# Patient Record
Sex: Male | Born: 1980
Health system: Southern US, Community
[De-identification: ages and names within clinical notes are randomized; demographics above are authoritative.]

## PROBLEM LIST (undated history)

## (undated) DIAGNOSIS — I10 Essential (primary) hypertension: Secondary | ICD-10-CM

## (undated) DIAGNOSIS — F17210 Nicotine dependence, cigarettes, uncomplicated: Secondary | ICD-10-CM

## (undated) DIAGNOSIS — E663 Overweight: Secondary | ICD-10-CM

## (undated) DIAGNOSIS — R079 Chest pain, unspecified: Secondary | ICD-10-CM

## (undated) DIAGNOSIS — R9439 Abnormal result of other cardiovascular function study: Secondary | ICD-10-CM

## (undated) HISTORY — PX: NO PAST SURGERIES: SHX2092

## (undated) HISTORY — DX: Chest pain, unspecified: R07.9

## (undated) HISTORY — DX: Nicotine dependence, cigarettes, uncomplicated: F17.210

## (undated) HISTORY — DX: Essential (primary) hypertension: I10

## (undated) HISTORY — DX: Overweight: E66.3

## (undated) HISTORY — DX: Abnormal result of other cardiovascular function study: R94.39

---

## 2000-08-20 ENCOUNTER — Emergency Department (HOSPITAL_COMMUNITY): Admission: EM | Admit: 2000-08-20 | Discharge: 2000-08-20 | Payer: Self-pay | Admitting: Emergency Medicine

## 2000-08-22 ENCOUNTER — Emergency Department (HOSPITAL_COMMUNITY): Admission: EM | Admit: 2000-08-22 | Discharge: 2000-08-22 | Payer: Self-pay | Admitting: Emergency Medicine

## 2002-04-19 ENCOUNTER — Emergency Department (HOSPITAL_COMMUNITY): Admission: EM | Admit: 2002-04-19 | Discharge: 2002-04-19 | Payer: Self-pay | Admitting: Emergency Medicine

## 2002-04-19 ENCOUNTER — Encounter: Payer: Self-pay | Admitting: Emergency Medicine

## 2002-04-26 ENCOUNTER — Emergency Department (HOSPITAL_COMMUNITY): Admission: EM | Admit: 2002-04-26 | Discharge: 2002-04-26 | Payer: Self-pay | Admitting: Emergency Medicine

## 2002-12-15 ENCOUNTER — Emergency Department (HOSPITAL_COMMUNITY): Admission: EM | Admit: 2002-12-15 | Discharge: 2002-12-15 | Payer: Self-pay | Admitting: Emergency Medicine

## 2006-11-05 ENCOUNTER — Emergency Department (HOSPITAL_COMMUNITY): Admission: EM | Admit: 2006-11-05 | Discharge: 2006-11-06 | Payer: Self-pay | Admitting: Emergency Medicine

## 2008-08-25 ENCOUNTER — Emergency Department (HOSPITAL_COMMUNITY): Admission: EM | Admit: 2008-08-25 | Discharge: 2008-08-25 | Payer: Self-pay | Admitting: Emergency Medicine

## 2011-12-08 ENCOUNTER — Emergency Department (HOSPITAL_COMMUNITY)
Admission: EM | Admit: 2011-12-08 | Discharge: 2011-12-08 | Disposition: A | Discharge status: Home / Self Care | Payer: No Typology Code available for payment source | Attending: Emergency Medicine | Admitting: Emergency Medicine

## 2011-12-08 ENCOUNTER — Encounter (HOSPITAL_COMMUNITY): Payer: Self-pay | Admitting: *Deleted

## 2011-12-08 DIAGNOSIS — Y9389 Activity, other specified: Secondary | ICD-10-CM | POA: Insufficient documentation

## 2011-12-08 DIAGNOSIS — S0993XA Unspecified injury of face, initial encounter: Secondary | ICD-10-CM | POA: Insufficient documentation

## 2011-12-08 DIAGNOSIS — IMO0002 Reserved for concepts with insufficient information to code with codable children: Secondary | ICD-10-CM | POA: Insufficient documentation

## 2011-12-08 DIAGNOSIS — M549 Dorsalgia, unspecified: Secondary | ICD-10-CM

## 2011-12-08 DIAGNOSIS — M542 Cervicalgia: Secondary | ICD-10-CM

## 2011-12-08 DIAGNOSIS — F17200 Nicotine dependence, unspecified, uncomplicated: Secondary | ICD-10-CM | POA: Insufficient documentation
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
## 2011-12-08 NOTE — ED Notes (Signed)
The pts blood was drawn.  Not needed lab notified.  Will not be resulted

## 2016-01-25 ENCOUNTER — Encounter (HOSPITAL_COMMUNITY): Payer: Self-pay

## 2016-01-25 ENCOUNTER — Emergency Department (HOSPITAL_COMMUNITY)
Admission: EM | Admit: 2016-01-25 | Discharge: 2016-01-25 | Disposition: A | Discharge status: Home / Self Care | Payer: Self-pay | Attending: Emergency Medicine | Admitting: Emergency Medicine

## 2016-01-25 ENCOUNTER — Emergency Department (HOSPITAL_COMMUNITY): Payer: Self-pay

## 2016-01-25 DIAGNOSIS — F17210 Nicotine dependence, cigarettes, uncomplicated: Secondary | ICD-10-CM | POA: Insufficient documentation

## 2016-01-25 DIAGNOSIS — J209 Acute bronchitis, unspecified: Secondary | ICD-10-CM

## 2016-01-25 IMAGING — CR DG CHEST 2V
2 series · 2 of 2 positions shown · non-contrast
Comparison: None in PACs

CLINICAL DATA: Illness of breath, cough, chest congestion, and body
aches for the past week. Her daughter was recently diagnosed with
pneumonia.

EXAM:
CHEST  2 VIEW

[w chest pa]
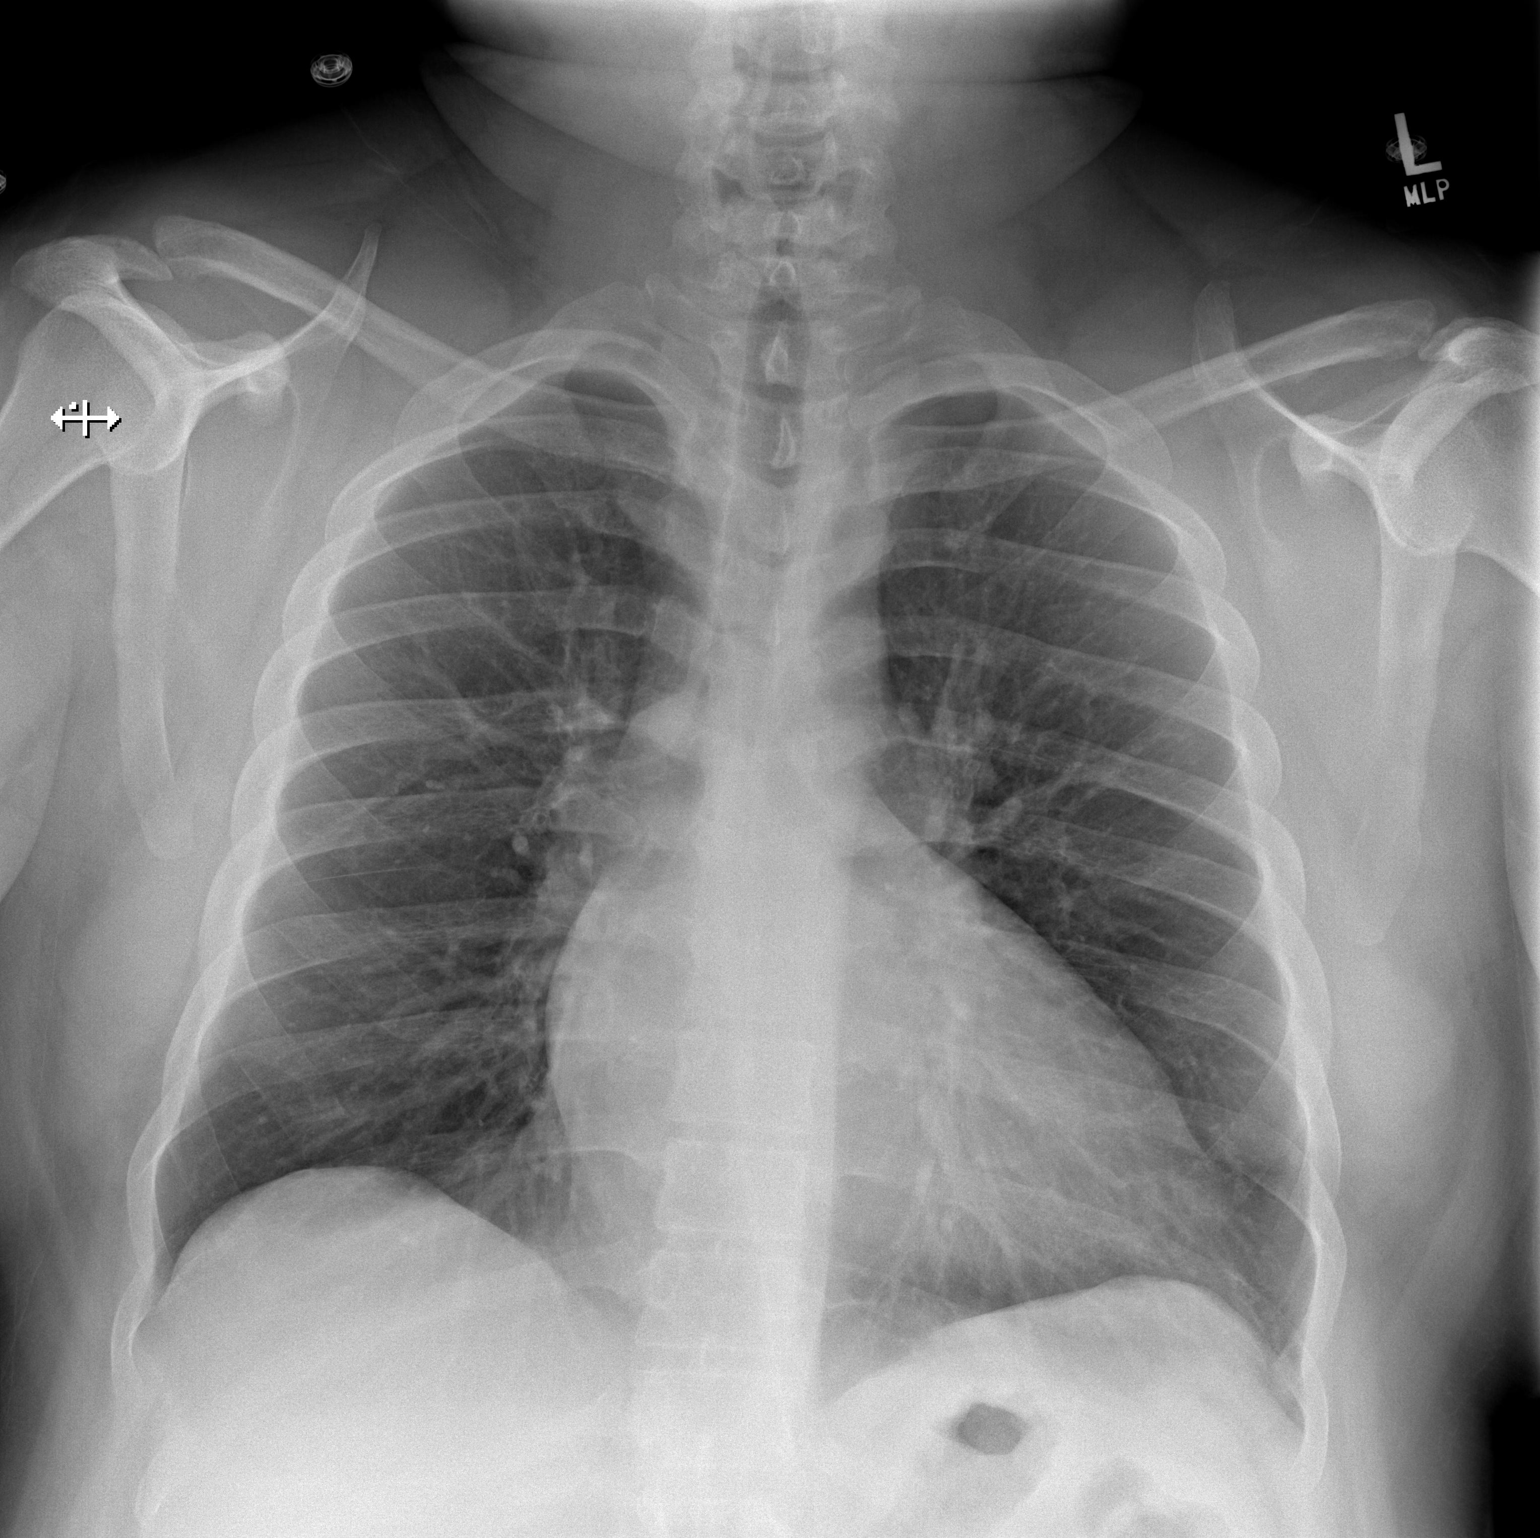

[w chest lat]
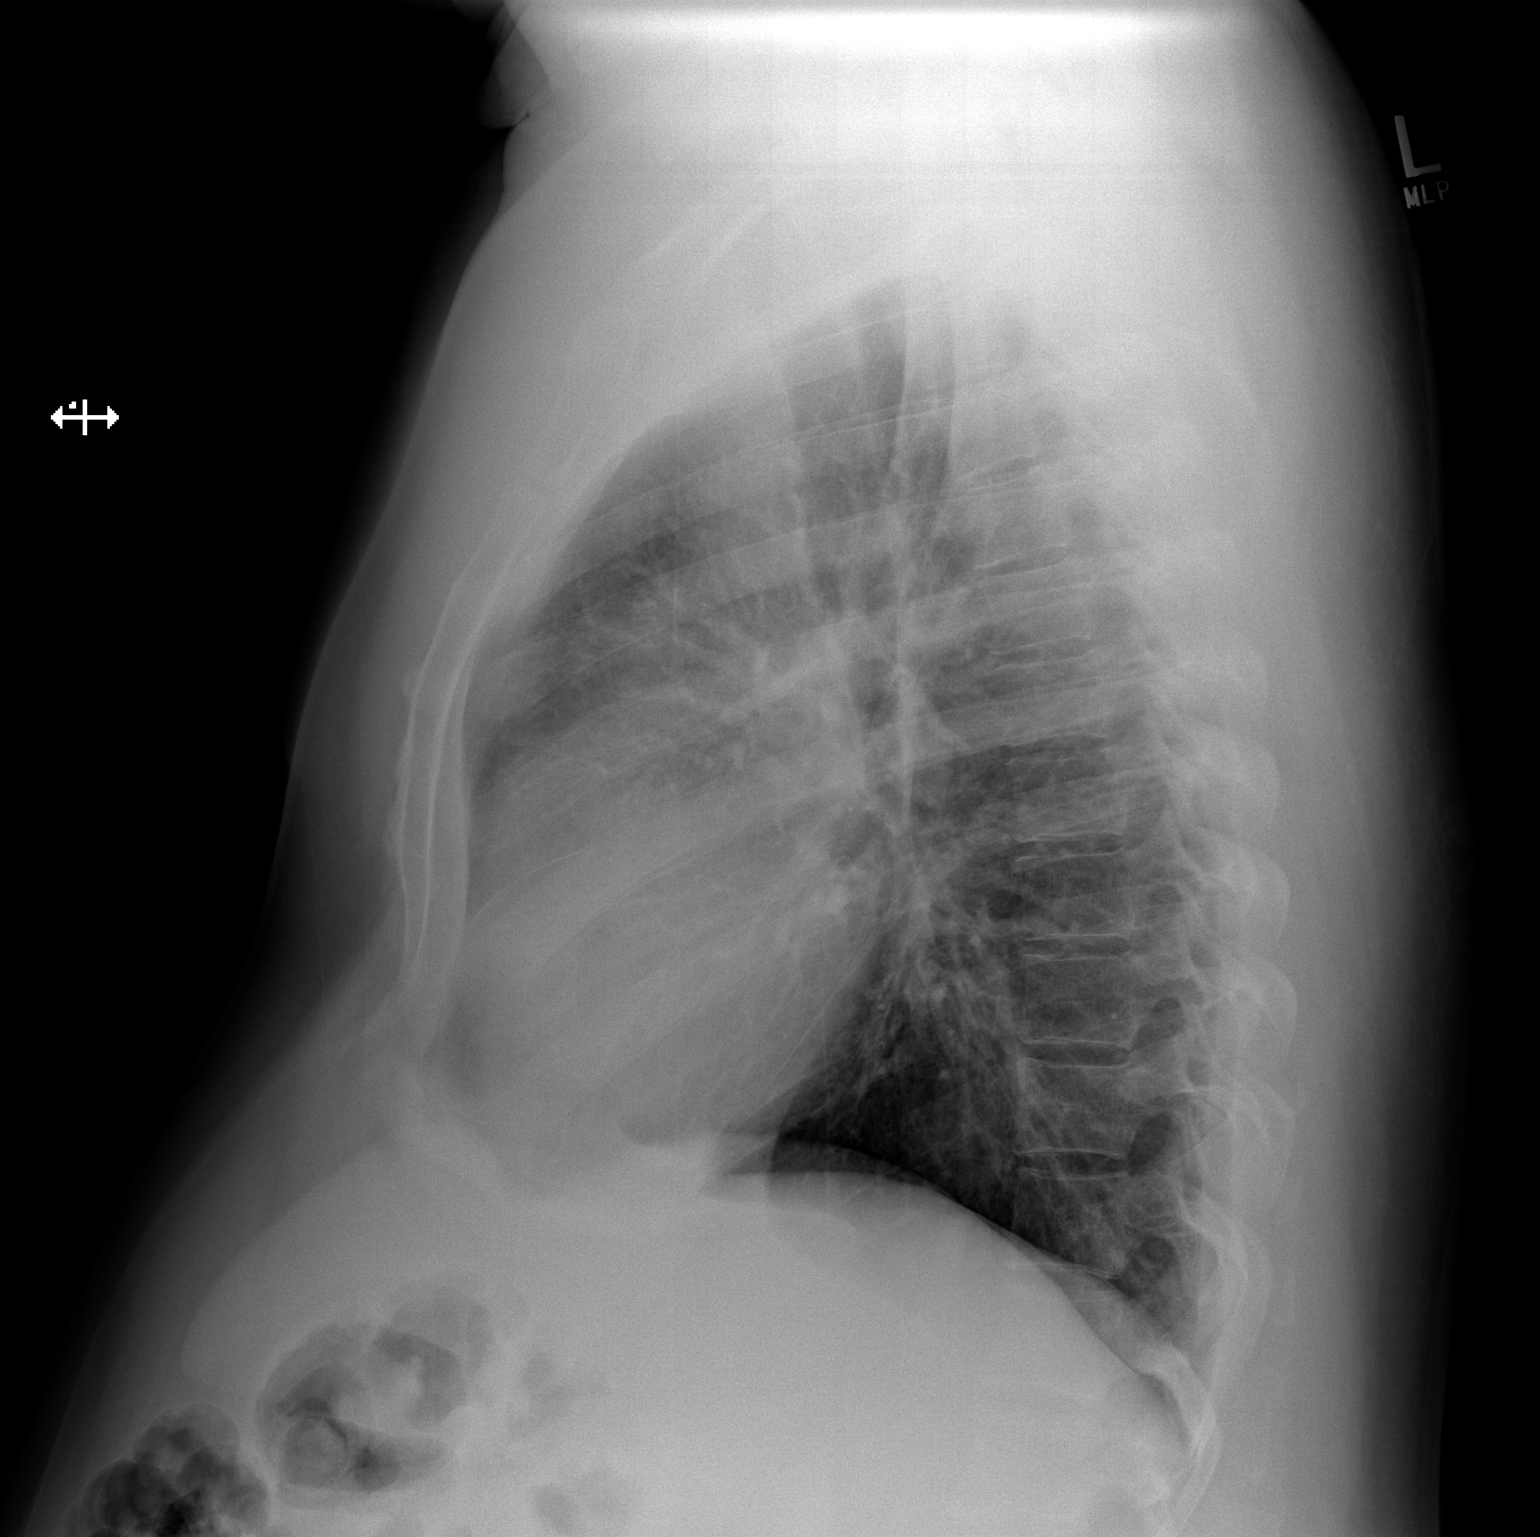

[2 of 2 positions shown; findings below may reference images not displayed]

FINDINGS: The lungs are adequately inflated and clear. The heart is top-normal
in size. The pulmonary vascularity is normal. The mediastinum is
normal in width. There is no pleural effusion. The bony thorax
exhibits no acute abnormality.
IMPRESSION: There is no active cardiopulmonary disease.

## 2016-01-25 MED ORDER — AZITHROMYCIN 250 MG PO TABS: ORAL_TABLET | ORAL | 0 refills | Status: DC

## 2016-01-25 NOTE — Discharge Instructions (Signed)
Zithromax as prescribed.  Continue over-the-counter medications as needed for symptomatically relief.  Return to the ER if symptoms significantly worsen or change.

## 2016-01-25 NOTE — ED Notes (Signed)
Patient transported to X-ray 

## 2016-01-25 NOTE — ED Triage Notes (Signed)
Patient c/o URI x 1 week tht includes, productive cough, nasal congestion, sore throat, chills, and body aches. Patient's daughter has had pneumonia recently.

## 2016-01-25 NOTE — ED Provider Notes (Signed)
[facility]-EMERGENCY DEPT Provider Note   CSN: [id number] Arrival date & time: 01/25/16  0913     History   Chief Complaint Chief Complaint  Patient presents with  . Shortness of Breath  . Cough    HPI Luke Wade is a 35 y.o. male.  Patient is a [age] year old male with no significant past medical history. He presents for evaluation of chest congestion and productive cough which is worsened over the past week. He feels short of breath at times but denies any chest pain. He has not checked his temperature but has felt fever. He does smoke cigarettes.   The history is provided by the patient.  Shortness of Breath  This is a new problem. The average episode lasts 1 week. The problem occurs continuously.The problem has been gradually worsening. Associated symptoms include cough and sputum production. Pertinent negatives include no fever and no chest pain. He has tried nothing for the symptoms. He has had no prior hospitalizations.  Cough  Associated symptoms include shortness of breath. Pertinent negatives include no chest pain.    History reviewed. No pertinent past medical history.  There are no active problems to display for this patient.   History reviewed. No pertinent surgical history.     Home Medications    Prior to Admission medications   Not on File    Family History Family History  Problem Relation Age of Onset  . Cancer Mother   . Cancer Father     Social History Social History  Substance Use Topics  . Smoking status: Current Every Day Smoker    Packs/day: 0.25    Types: Cigarettes  . Smokeless tobacco: Never Used  . Alcohol use Yes     Comment: weekends      Allergies   Patient has no known allergies.   Review of Systems Review of Systems  Constitutional: Negative for fever.  Respiratory: Positive for cough, sputum production and shortness of breath.   Cardiovascular: Negative for chest pain.  All other systems reviewed and are  negative.    Physical Exam Updated Vital Signs BP 148/85 (BP Location: Left Arm)   Pulse 104   Temp 98.4 F (36.9 C) (Oral)   Resp 18   Ht 5\' 5"  (1.651 m)   Wt 236 lb (107 kg)   SpO2 94%   BMI 39.27 kg/m   Physical Exam  Constitutional: He is oriented to person, place, and time. He appears well-developed and well-nourished. No distress.  HENT:  Head: Normocephalic and atraumatic.  Mouth/Throat: Oropharynx is clear and moist.  Neck: Normal range of motion. Neck supple.  Cardiovascular: Normal rate and regular rhythm.  Exam reveals no friction rub.   No murmur heard. Pulmonary/Chest: Effort normal and breath sounds normal. No respiratory distress. He has no wheezes. He has no rales.  Abdominal: Soft. Bowel sounds are normal. He exhibits no distension. There is no tenderness.  Musculoskeletal: Normal range of motion. He exhibits no edema.  Neurological: He is alert and oriented to person, place, and time. Coordination normal.  Skin: Skin is warm and dry. He is not diaphoretic.  Nursing note and vitals reviewed.    ED Treatments / Results  Labs (all labs ordered are listed, but only abnormal results are displayed) Labs Reviewed - No data to display  EKG  EKG Interpretation None       Radiology No results found.  Procedures Procedures (including critical care time)  Medications Ordered in ED Medications - No data to  display   Initial Impression / Assessment and Plan / ED Course  I have reviewed the triage vital signs and the nursing notes.  Pertinent labs & imaging results that were available during my care of the patient were reviewed by me and considered in my medical decision making (see chart for details).  Clinical Course     Chest x-ray is clear. Patient has ongoing symptoms for one week and is a smoker, and his daughter at home was recently diagnosed with pneumonia. He will be treated with Zithromax and when necessary return.  Final Clinical  Impressions(s) / ED Diagnoses   Final diagnoses:  None    New Prescriptions New Prescriptions   No medications on file     Geoffery Lyonsouglas Juletta Berhe, MD 01/25/16 1103

## 2017-04-11 ENCOUNTER — Other Ambulatory Visit: Payer: Self-pay

## 2017-04-11 ENCOUNTER — Encounter (HOSPITAL_COMMUNITY): Payer: Self-pay

## 2017-04-11 ENCOUNTER — Emergency Department (HOSPITAL_COMMUNITY): Payer: 59

## 2017-04-11 ENCOUNTER — Emergency Department (HOSPITAL_COMMUNITY)
Admission: EM | Admit: 2017-04-11 | Discharge: 2017-04-11 | Disposition: A | Discharge status: Home / Self Care | Payer: 59 | Attending: Emergency Medicine | Admitting: Emergency Medicine

## 2017-04-11 DIAGNOSIS — Z79899 Other long term (current) drug therapy: Secondary | ICD-10-CM | POA: Insufficient documentation

## 2017-04-11 DIAGNOSIS — R9431 Abnormal electrocardiogram [ECG] [EKG]: Secondary | ICD-10-CM | POA: Diagnosis not present

## 2017-04-11 DIAGNOSIS — R079 Chest pain, unspecified: Secondary | ICD-10-CM | POA: Insufficient documentation

## 2017-04-11 DIAGNOSIS — R030 Elevated blood-pressure reading, without diagnosis of hypertension: Secondary | ICD-10-CM | POA: Diagnosis not present

## 2017-04-11 DIAGNOSIS — F17210 Nicotine dependence, cigarettes, uncomplicated: Secondary | ICD-10-CM | POA: Diagnosis not present

## 2017-04-11 DIAGNOSIS — Z716 Tobacco abuse counseling: Secondary | ICD-10-CM | POA: Diagnosis not present

## 2017-04-11 DIAGNOSIS — M546 Pain in thoracic spine: Secondary | ICD-10-CM | POA: Diagnosis not present

## 2017-04-11 DIAGNOSIS — I10 Essential (primary) hypertension: Secondary | ICD-10-CM | POA: Diagnosis not present

## 2017-04-11 LAB — COMPREHENSIVE METABOLIC PANEL
ALK PHOS: 58 U/L (ref 38–126)
ALT: 20 U/L (ref 17–63)
AST: 18 U/L (ref 15–41)
Albumin: 3.7 g/dL (ref 3.5–5.0)
Anion gap: 10 (ref 5–15)
BUN: 14 mg/dL (ref 6–20)
CALCIUM: 8.5 mg/dL — AB (ref 8.9–10.3)
CHLORIDE: 107 mmol/L (ref 101–111)
CO2: 21 mmol/L — ABNORMAL LOW (ref 22–32)
CREATININE: 0.92 mg/dL (ref 0.61–1.24)
Glucose, Bld: 96 mg/dL (ref 65–99)
Potassium: 4.2 mmol/L (ref 3.5–5.1)
Sodium: 138 mmol/L (ref 135–145)
Total Bilirubin: 0.4 mg/dL (ref 0.3–1.2)
Total Protein: 6.5 g/dL (ref 6.5–8.1)

## 2017-04-11 LAB — I-STAT TROPONIN, ED
TROPONIN I, POC: 0 ng/mL (ref 0.00–0.08)
TROPONIN I, POC: 0 ng/mL (ref 0.00–0.08)

## 2017-04-11 LAB — CBC WITH DIFFERENTIAL/PLATELET
Basophils Absolute: 0 10*3/uL (ref 0.0–0.1)
Basophils Relative: 0 %
EOS ABS: 0.1 10*3/uL (ref 0.0–0.7)
Eosinophils Relative: 2 %
HCT: 43.5 % (ref 39.0–52.0)
Hemoglobin: 14.1 g/dL (ref 13.0–17.0)
LYMPHS ABS: 1.6 10*3/uL (ref 0.7–4.0)
Lymphocytes Relative: 20 %
MCH: 25.8 pg — AB (ref 26.0–34.0)
MCHC: 32.4 g/dL (ref 30.0–36.0)
MCV: 79.7 fL (ref 78.0–100.0)
Monocytes Absolute: 0.4 10*3/uL (ref 0.1–1.0)
Monocytes Relative: 5 %
Neutro Abs: 5.6 10*3/uL (ref 1.7–7.7)
Neutrophils Relative %: 73 %
PLATELETS: 249 10*3/uL (ref 150–400)
RBC: 5.46 MIL/uL (ref 4.22–5.81)
RDW: 15.5 % (ref 11.5–15.5)
WBC: 7.7 10*3/uL (ref 4.0–10.5)

## 2017-04-11 LAB — D-DIMER, QUANTITATIVE (NOT AT ARMC): D DIMER QUANT: 0.31 ug{FEU}/mL (ref 0.00–0.50)

## 2017-04-11 IMAGING — DX DG CHEST 2V
2 series · 2 of 2 positions shown · non-contrast
Comparison: PA and lateral chest [DATE].

CLINICAL DATA: Hypertension.  Abnormal EKG today.

EXAM:
CHEST - 2 VIEW

[w chest pa]
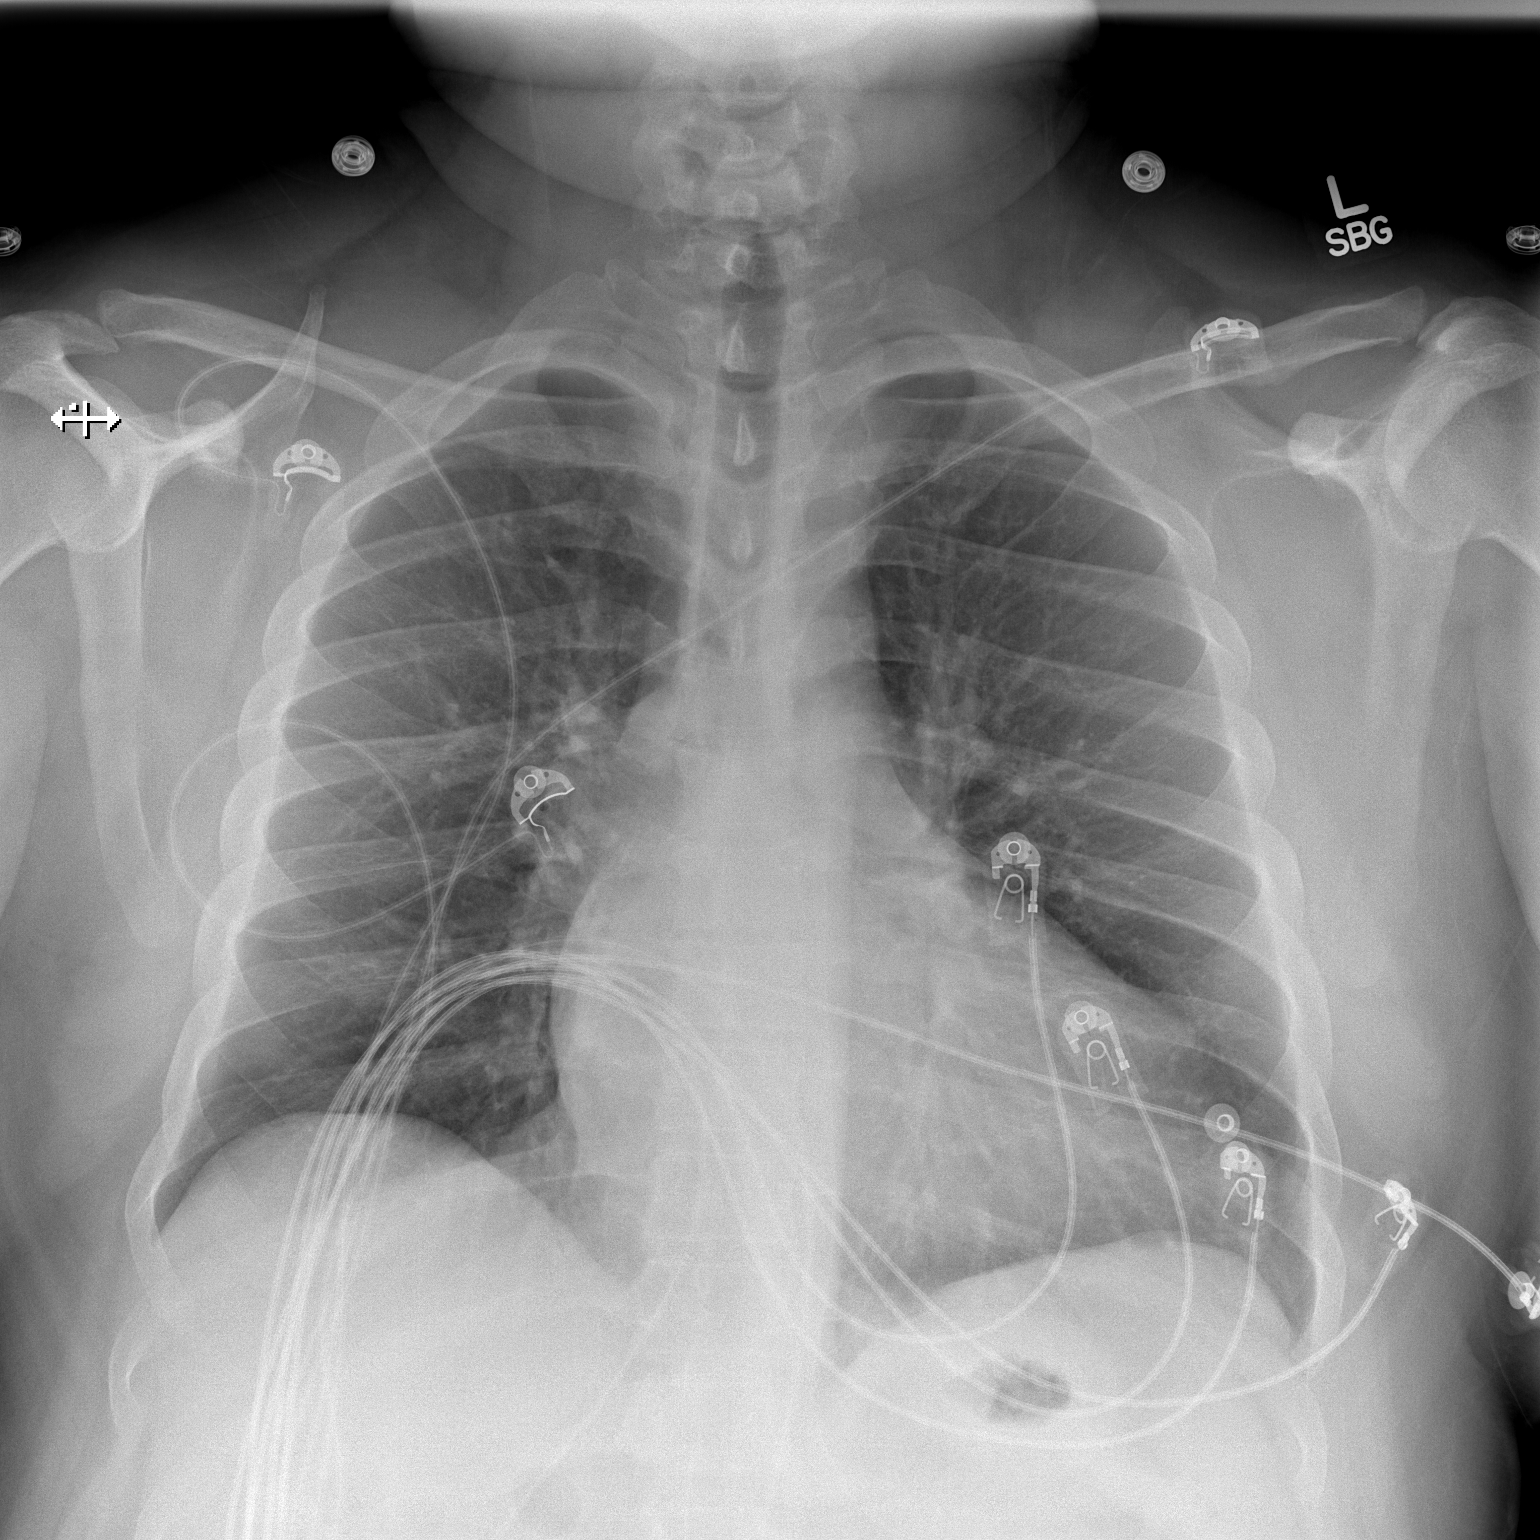

[w chest lat]
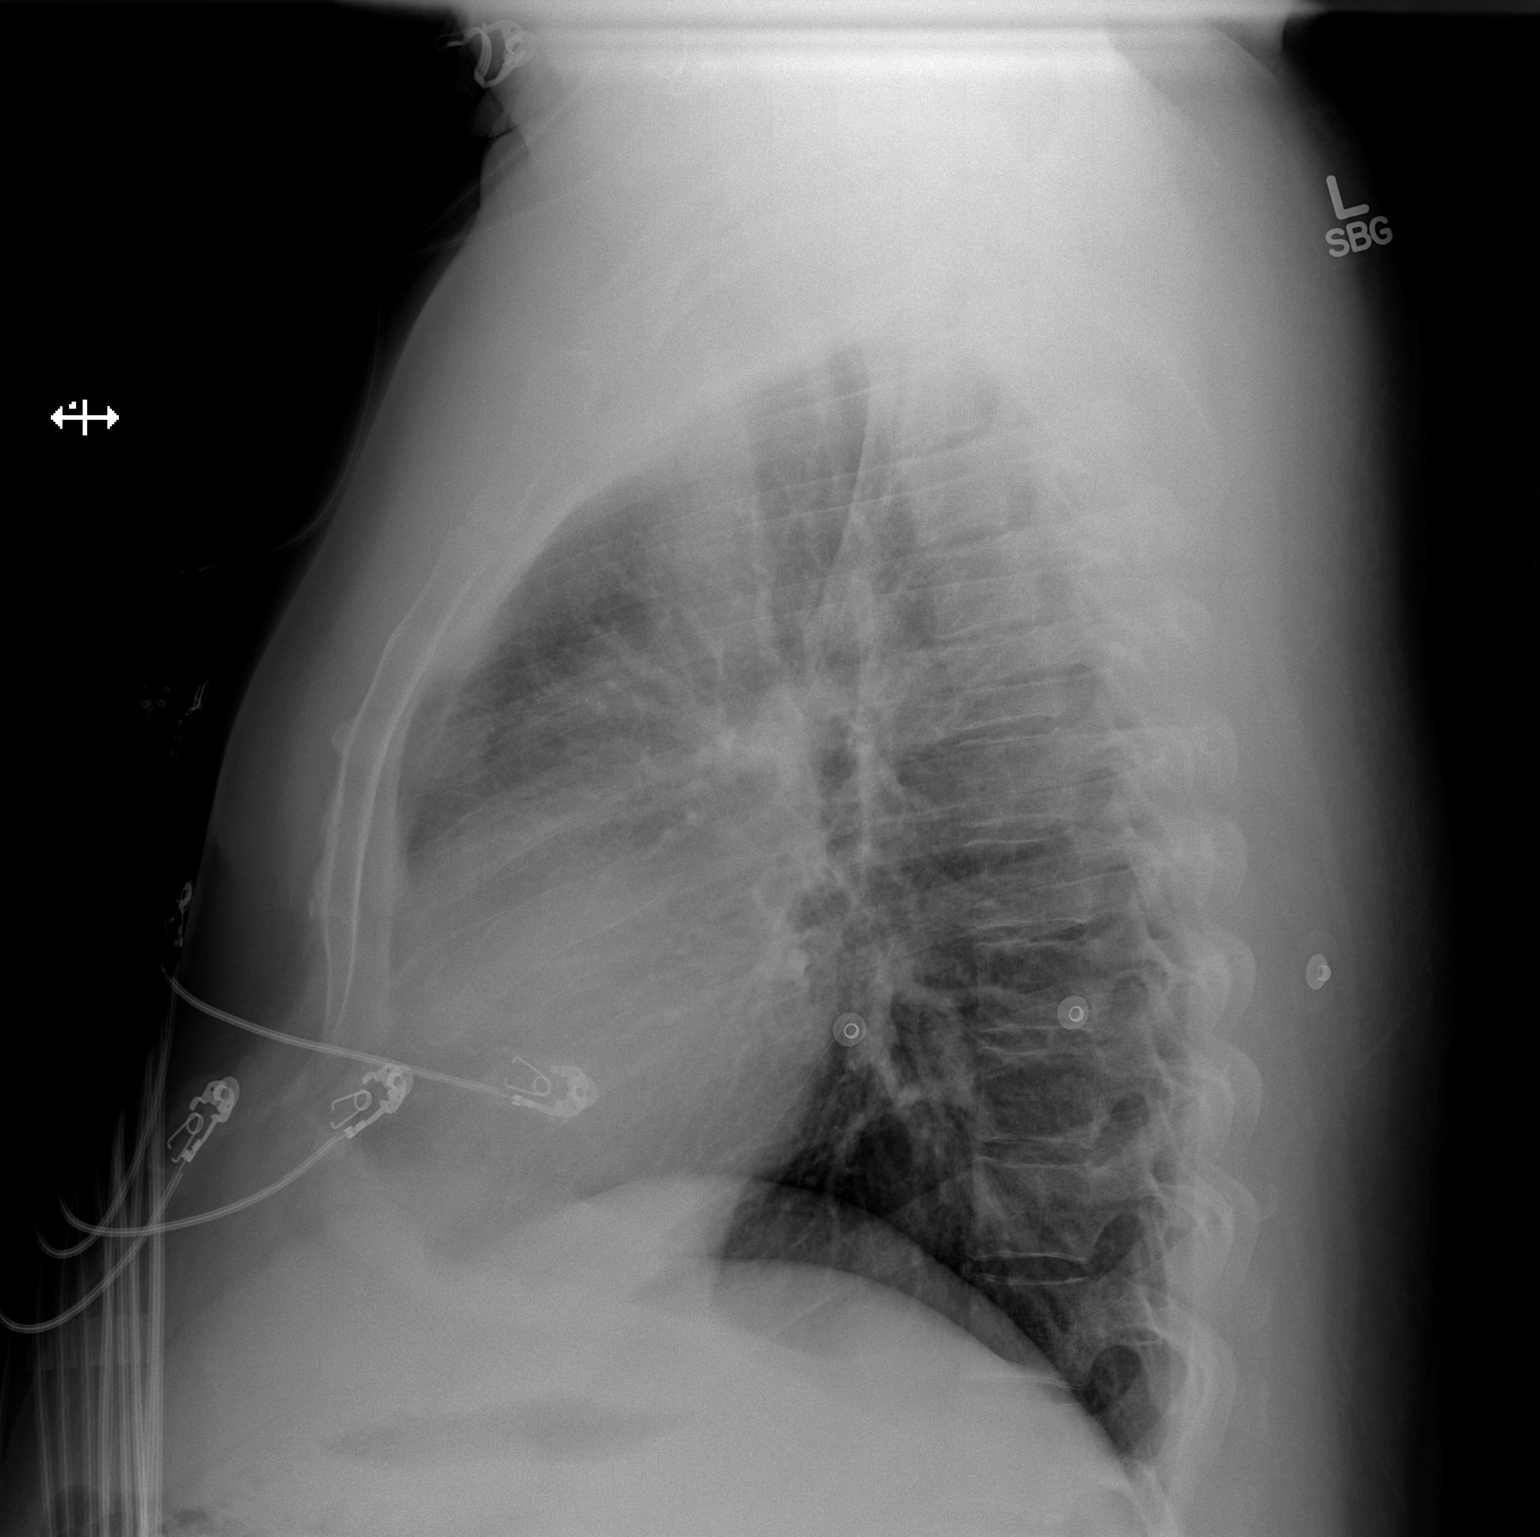

[2 of 2 positions shown; findings below may reference images not displayed]

FINDINGS: There is cardiomegaly without edema. No pneumothorax or pleural
effusion. No focal bony abnormality.
IMPRESSION: Cardiomegaly without acute disease.

## 2017-04-11 NOTE — ED Notes (Signed)
Got patient undress on the monitor did ekg shown to Dr Wentz patient is resting with call bell in reach and family at bedside 

## 2017-04-11 NOTE — ED Notes (Signed)
Patient verbalizes understanding of discharge instructions. Opportunity for questioning and answers were provided. Armband removed by staff, pt discharged from ED ambulatory.   

## 2017-04-11 NOTE — ED Provider Notes (Signed)
[facility] EMERGENCY DEPARTMENT Provider Note   CSN: [id number] Arrival date & time: 04/11/17  1111     History   Chief Complaint Chief Complaint  Patient presents with  . Hypertension  . Abnormal ECG    HPI Luke Wade is a 37 y.o. male with no significant past medical history who presents for evaluation of abnormal EKG, hypertension.  Patient had gone to primary care doctor's office to establish PCP care.  He reports that he had been having intermittent episodes of chest pain for the last 3 months.  Patient reports that these pains were brought on and worsened with exertion.  He describes it as a pressure over his anterior chest.  Pain was not worse with deep inspiration.  He reports that the pain would resolve by itself after a few minutes and with rest.  Patient reports that he did not get nauseous, diaphoretic, have vomiting with episodes of chest pain. Patient reports that he was not having any CP today.  Patient initially thought that episodes of pain were related to smoking.  He states that he would sometimes have pain in the middle of night that would wake him up from sleep.  He did not have a primary care doctor to follow-up with but establish one today.  While at primary care doctor, patient was hypertensive, had some EKG abnormalities, prompting ED visit.  On EMS arrival, patient was hypertensive at 206/114.  He states that at the time, he was not having any vision changes, CP, SOB, HA, numbness/weakness. Patient reports that he has no diagnosed history of high blood pressure but has had episodes of high blood pressure. He has not followed up with a primary care doctor for several years. He is not currently on anti-hypertensives.  Patient is a current smoker.  He reports he smokes 1-2 packs of cigarettes a day.  Denies any cocaine, heroin, marijuana use.  He denies any personal cardiac history.  He does not know of any family cardiac history. He denies any hormone  use, recent immobilization, prior history of DVT/PE, recent surgery, leg swelling, or long travel.  Patient denies any difficulty breathing, abdominal pain, vomiting, numbness/weakness of his arms or legs, swelling of his legs.  The history is provided by the patient.    History reviewed. No pertinent past medical history.  There are no active problems to display for this patient.   History reviewed. No pertinent surgical history.     Home Medications    Prior to Admission medications   Medication Sig Start Date End Date Taking? Authorizing Provider  azithromycin (ZITHROMAX Z-PAK) 250 MG tablet 2 po day one, then 1 daily x 4 days 01/25/16   Geoffery Lyons, MD  Hyprom-Naphaz-Polysorb-Zn Sulf (CLEAR EYES COMPLETE) SOLN Apply 2-3 drops to eye 2 (two) times daily.    [provider]  Phenylephrine-DM-GG-APAP (TYLENOL COLD/FLU SEVERE PO) Take 2 tablets by mouth 2 (two) times daily.    [provider]  Pseudoeph-Doxylamine-DM-APAP (TYLENOL COLD/FLU SEVERE NIGHT PO) Take 30 mLs by mouth at bedtime.    [provider]    Family History Family History  Problem Relation Age of Onset  . Cancer Mother   . Cancer Father     Social History Social History   Tobacco Use  . Smoking status: Current Every Day Smoker    Packs/day: 0.25    Types: Cigarettes  . Smokeless tobacco: Never Used  Substance Use Topics  . Alcohol use: Yes  Comment: weekends   . Drug use: Unknown    Types: Marijuana    Comment: occasionally     Allergies   Patient has no known allergies.   Review of Systems Review of Systems  Constitutional: Negative for chills and fever.  HENT: Negative for congestion.   Eyes: Negative for visual disturbance.  Respiratory: Negative for cough and shortness of breath.   Cardiovascular: Positive for chest pain.  Gastrointestinal: Negative for abdominal pain, diarrhea, nausea and vomiting.  Genitourinary: Negative for dysuria and hematuria.    Musculoskeletal: Negative for back pain and neck pain.  Skin: Negative for rash.  Neurological: Negative for dizziness, weakness, numbness and headaches.  Psychiatric/Behavioral: Negative for confusion.  All other systems reviewed and are negative.    Physical Exam Updated Vital Signs BP (!) 168/78   Pulse 70   Temp 97.8 F (36.6 C) (Oral)   Resp 18   Ht 5\' 5"  (1.651 m)   Wt 99.8 kg (220 lb)   SpO2 97%   BMI 36.61 kg/m   Physical Exam  Constitutional: He is oriented to person, place, and time. He appears well-developed and well-nourished.  Sitting comfortably on examination table  HENT:  Head: Normocephalic and atraumatic.  Mouth/Throat: Oropharynx is clear and moist and mucous membranes are normal.  Eyes: Conjunctivae, EOM and lids are normal. Pupils are equal, round, and reactive to light.  Neck: Full passive range of motion without pain.  Cardiovascular: Normal rate, regular rhythm, normal heart sounds and normal pulses. Exam reveals no gallop and no friction rub.  No murmur heard. Pulses:      Radial pulses are 2+ on the right side, and 2+ on the left side.       Dorsalis pedis pulses are 2+ on the right side, and 2+ on the left side.  Pulmonary/Chest: Effort normal and breath sounds normal.  Abdominal: Soft. Normal appearance. There is no tenderness. There is no rigidity and no guarding.  Musculoskeletal: Normal range of motion.  Neurological: He is alert and oriented to person, place, and time.  Cranial nerves III-XII intact Follows commands, Moves all extremities  5/5 strength to BUE and BLE  Sensation intact throughout all major nerve distributions Normal finger to nose. No dysdiadochokinesia. No pronator drift. No gait abnormalities  No slurred speech. No facial droop.   Skin: Skin is warm and dry. Capillary refill takes less than 2 seconds.  Good distal cap refill. BLE are not dusky in appearance or cool to touch.  Psychiatric: He has a normal mood and  affect. His speech is normal.  Nursing note and vitals reviewed.    ED Treatments / Results  Labs (all labs ordered are listed, but only abnormal results are displayed) Labs Reviewed  COMPREHENSIVE METABOLIC PANEL - Abnormal; Notable for the following components:      Result Value   CO2 21 (*)    Calcium 8.5 (*)    All other components within normal limits  CBC WITH DIFFERENTIAL/PLATELET - Abnormal; Notable for the following components:   MCH 25.8 (*)    All other components within normal limits  D-DIMER, QUANTITATIVE (NOT AT [facility])  I-STAT TROPONIN, ED  I-STAT TROPONIN, ED    EKG  EKG Interpretation  Date/Time:  Wednesday April 11 2017 11:28:10 EST Ventricular Rate:  88 PR Interval:    QRS Duration: 94 QT Interval:  396 QTC Calculation: 480 R Axis:   68 Text Interpretation:  Sinus rhythm Probable left atrial enlargement LVH with secondary repolarization  abnormality ST depr, consider ischemia, inferior leads Borderline prolonged QT interval No old tracing to compare Confirmed by Mancel Bale [phone]) on 04/11/2017 11:34:16 AM        Radiology Dg Chest 2 View  Result Date: 04/11/2017 CLINICAL DATA:  Hypertension.  Abnormal EKG today. EXAM: CHEST - 2 VIEW COMPARISON:  PA and lateral chest 01/25/2016. FINDINGS: There is cardiomegaly without edema. No pneumothorax or pleural effusion. No focal bony abnormality. IMPRESSION: Cardiomegaly without acute disease. Electronically Signed   By: Drusilla Kanner M.D.   On: 04/11/2017 16:09    Procedures Procedures (including critical care time)  Medications Ordered in ED Medications - No data to display   Initial Impression / Assessment and Plan / ED Course  I have reviewed the triage vital signs and the nursing notes.  Pertinent labs & imaging results that were available during my care of the patient were reviewed by me and considered in my medical decision making (see chart for details).     37 y.o. M who presents to the ED  for evaluation hypertension and abnormal EKG.  Patient was seen by a primary care doctor earlier today to establish care.  At the time, he was hypertensive at 2 6/114.  Additionally, there was some abnormalities on his EKG.  Patient was given clonidine and prompted to go to the emergency department for further evaluation.  He reports that he had not seen a primary care doctor in the last several years.  He was being seen today to establish primary care.  He reports he has had episodes of chest pain for the last 2-3 months.  No chest pain today. Patient is afebrile, non-toxic appearing, sitting comfortably on examination table. Vital signs reviewed and stable.  Patient did receive clonidine prior to ED arrival.  No neuro deficits noted on exam.  Initial EKG shows some T wave inversions in 3 V3 through V6 with some ST depressions in leads I, 2, 3.  Plan to check basic labs, including troponin, chest x-ray.  We will plan to consult cardiology.  Patient is not currently complaining of any symptoms at this time.  He denies any chest pain, back pain, headache, vision changes, numbness/weakness.  Initial troponin negative.  D-dimer negative.  CMP shows slightly low bicarb but otherwise unremarkable.  CBC is without significant leukocytosis or anemia.  Discussed patient with Dr. Tresa Endo (cardiology) who reviewed his EKG. Does not feel that patient's EKG reflects acute changes.  Findings are likely a result of LVH from untreated hypertension.    Chest x-ray reviewed.  Shows cardiomegaly.  Repeat troponin is negative.  Repeat EKG shows unchanged from previous.  Patient is slightly hypertensive here in the ED but currently denies any symptoms  Patient was provided prescriptions for blood pressure medications, Wellbutrin at the primary care office today.  He has not had a chance to fill them yet.  Discussed with Dr. Tresa Endo (Cardiology) Regarding repeat troponin, repeat EKG.  Does not feel that these changes are acute and  feels that patient can be comfortably discharged home with outpatient cardiac follow-up.  Patient will need an outpatient echo and will have to follow-up with cardiology.  We will plan to provide outpatient referral. Discussed patient with Dr. Effie Shy who agrees with plan.   Discussed plan with patient.  He is agreeable. He is currently denying any symptoms.  Instructed patient to fill prescriptions that he was given today.  Encourage smoking cessation.  Instructed to follow-up with outpatient cardiology as directed and  paperwork. Patient had ample opportunity for questions and discussion. All patient's questions were answered with full understanding. Strict return precautions discussed. Patient expresses understanding and agreement to plan.     Final Clinical Impressions(s) / ED Diagnoses   Final diagnoses:  Abnormal EKG  Elevated blood pressure reading    ED Discharge Orders        Ordered    ECHOCARDIOGRAM COMPLETE     04/11/17 1710       Maxwell Caul, PA-C 04/11/17 1825    Mancel Bale, MD 04/11/17 2223

## 2017-04-11 NOTE — Discharge Instructions (Signed)
Take the blood pressure medication that was prescribed to you by the doctor today.  Take the Wellbutrin as directed to help with quit smoking.  It is very important that he stop smoking.  Follow-up with referred cardiologist.  Call their office and arrange for an appointment.  At some point, you will need a outpatient ultrasound of your heart.  They can arrange for this.  As we discussed, return emergency department for any chest pain, difficulty breathing, numbness/weakness of your arms or legs, vision changes, vomiting or any other worsening or concerning symptoms.

## 2017-04-11 NOTE — ED Notes (Signed)
Per xray, pt is scheduled, xray is backed up at the time

## 2017-04-11 NOTE — ED Notes (Signed)
Patient ambulatory to bathroom with steady gait at this time Pt transferred to xray

## 2017-04-11 NOTE — ED Triage Notes (Signed)
[facility] with complaints of hypertension and abnormal EKG with t inversion and LVH with ST depression since this morning. EMS reports pt also had htn 206/114 upon arrival to PCP office, pt takes no medications because pt has not seen a PCP in years. Pt reports intermittent CP x3 months. Pt ambulatory to stretcher. Pt placed in position of comfort with bed locked and lowered, call bell in reach.

## 2017-04-16 ENCOUNTER — Ambulatory Visit (INDEPENDENT_AMBULATORY_CARE_PROVIDER_SITE_OTHER): Payer: 59 | Admitting: Cardiology

## 2017-04-16 ENCOUNTER — Encounter: Payer: Self-pay | Admitting: Cardiology

## 2017-04-16 VITALS — BP 160/92 | HR 81 | Ht 65.0 in | Wt 239.0 lb

## 2017-04-16 DIAGNOSIS — F17210 Nicotine dependence, cigarettes, uncomplicated: Secondary | ICD-10-CM | POA: Diagnosis not present

## 2017-04-16 DIAGNOSIS — E663 Overweight: Secondary | ICD-10-CM | POA: Diagnosis not present

## 2017-04-16 DIAGNOSIS — R079 Chest pain, unspecified: Secondary | ICD-10-CM

## 2017-04-16 DIAGNOSIS — I10 Essential (primary) hypertension: Secondary | ICD-10-CM | POA: Diagnosis not present

## 2017-04-16 HISTORY — DX: Chest pain, unspecified: R07.9

## 2017-04-16 HISTORY — DX: Nicotine dependence, cigarettes, uncomplicated: F17.210

## 2017-04-16 HISTORY — DX: Essential (primary) hypertension: I10

## 2017-04-16 HISTORY — DX: Overweight: E66.3

## 2017-04-16 MED ORDER — SPIRONOLACTONE-HCTZ 25-25 MG PO TABS: 1.0000 | ORAL_TABLET | Freq: Every day | ORAL | 1 refills | Status: DC

## 2017-04-16 NOTE — Patient Instructions (Signed)
Medication Instructions:  Your physician has recommended you make the following change in your medication:  START spironolactone-HCTZ combo 25-25 mg daily  Labwork: Your physician recommends that you have the following labs drawn: BMP in 2 weeks  Testing/Procedures: Your physician has requested that you have a lexiscan myoview. For further information please visit [email or web address]. Please follow instruction sheet, as given.  Your physician has requested that you have a renal artery duplex. During this test, an ultrasound is used to evaluate blood flow to the kidneys. Allow one hour for this exam. Do not eat after midnight the day before and avoid carbonated beverages. Take your medications as you usually do.  Follow-Up: Your physician recommends that you schedule a follow-up appointment in: 1 month  Any Other Special Instructions Will Be Listed Below (If Applicable).     If you need a refill on your cardiac medications before your next appointment, please call your pharmacy.   [facility]  Garey HamAshley A, RN, BSN

## 2017-04-16 NOTE — Progress Notes (Signed)
Cardiology Office Note:    Date:  04/16/2017   ID:  Luke Wade, DOB 1980/10/14, MRN [id number]  PCP:  Patient, No Pcp Per  Cardiologist:  Garwin Brothers, MD   Referring MD: No ref. provider found    ASSESSMENT:    1. Chest pain, unspecified type   2. Hypertension, unspecified type   3. Cigarette smoker   4. Overweight    PLAN:    In order of problems listed above:  1. Primary prevention stressed with the patient.  Importance of compliance with diet and medications stressed and he vocalized understanding.  Diet and lifestyle modification for essential hypertension was stressed.  Diet was also discussed for obesity and the risks of obesity explained and he vocalized understanding. 2. I spent 5 minutes with the patient discussing solely about smoking. Smoking cessation was counseled. I suggested to the patient also different medications and pharmacological interventions. Patient is keen to try stopping on its own at this time. He will get back to me if he needs any further assistance in this matter. 3. His blood pressure significantly elevated and I have added Spironolactone and hydrochlorothiazide.  He will be back in a week for a pulse blood pressure check and a Chem-7. 4. Echocardiogram will be done to assess murmur heard on auscultation.  This will also assist the issues of left ventricular hypertrophy and left atrial size in view of long-standing hypertension endorgan damage. 5. To assess chest pain he will undergo Lexiscan sestamibi. 6. Risk stratification he will get a lipid check done today.  He is fasting and has not had anything past midnight. 7. He will be seen in follow-up appointment in a month or earlier if he has any concerns.  He was advised to the nearest emergency room for any significant concerns or symptoms.   Medication Adjustments/Labs and Tests Ordered: Current medicines are reviewed at length with the patient today.  Concerns regarding medicines are outlined  above.  Orders Placed This Encounter  Procedures  . MYOCARDIAL PERFUSION IMAGING   Meds ordered this encounter  Medications  . spironolactone-hydrochlorothiazide (ALDACTAZIDE) 25-25 MG tablet    Sig: Take 1 tablet by mouth daily.    Dispense:  30 tablet    Refill:  1     History of Present Illness:    Luke Wade is a 37 y.o. male who is being seen today for the evaluation of essential hypertension.  Patient is a pleasant 37 year old male with past medical history of essential hypertension.  He smokes significantly and tells me that he quit smoking a couple of days ago after he went to the emergency room.  He has been referred here for elevated blood pressure.  He denies any chest pain orthopnea or PND.  Only when he exerts himself significantly he feels like pinprick sensation in the chest.  No orthopnea or PND.  He does not exercise on a regular basis.  I asked him with sexual activity brings about any chest pain or chest tightness and answered in the negative.  He leads a sedentary lifestyle.  These symptoms have been going on in the past several weeks.  At the time of my evaluation, the patient is alert awake oriented and in no distress.  History reviewed. No pertinent past medical history.  History reviewed. No pertinent surgical history.  Current Medications: Current Meds  Medication Sig  . amLODipine-benazepril (LOTREL) 5-20 MG capsule Take by mouth daily.  Marland Kitchen azithromycin (ZITHROMAX Z-PAK) 250 MG tablet 2  po day one, then 1 daily x 4 days  . meloxicam (MOBIC) 15 MG tablet Take 15 mg by mouth daily.  Marland Kitchen. Phenylephrine-DM-GG-APAP (TYLENOL COLD/FLU SEVERE PO) Take 2 tablets by mouth 2 (two) times daily.     Allergies:   Patient has no known allergies.   Social History   Socioeconomic History  . Marital status: Single    Spouse name: None  . Number of children: None  . Years of education: None  . Highest education level: None  Social Needs  . Financial resource strain:  None  . Food insecurity - worry: None  . Food insecurity - inability: None  . Transportation needs - medical: None  . Transportation needs - non-medical: None  Occupational History  . None  Tobacco Use  . Smoking status: Current Every Day Smoker    Packs/day: 0.25    Types: Cigarettes  . Smokeless tobacco: Never Used  Substance and Sexual Activity  . Alcohol use: Yes    Comment: weekends   . Drug use: Unknown    Types: Marijuana    Comment: occasionally  . Sexual activity: None  Other Topics Concern  . None  Social History Narrative  . None     Family History: The patient's family history includes Cancer in his father and mother.  ROS:   Please see the history of present illness.    All other systems reviewed and are negative.  EKGs/Labs/Other Studies Reviewed:    The following studies were reviewed today: EKG reveals sinus rhythm with left ventricular hypertrophy.  This was done elsewhere.   Recent Labs: 04/11/2017: ALT 20; BUN 14; Creatinine, Ser 0.92; Hemoglobin 14.1; Platelets 249; Potassium 4.2; Sodium 138  Recent Lipid Panel No results found for: CHOL, TRIG, HDL, CHOLHDL, VLDL, LDLCALC, LDLDIRECT  Physical Exam:    VS:  BP (!) 160/92 (BP Location: Left Arm, Patient Position: Sitting, Cuff Size: Normal)   Pulse 81   Ht 5\' 5"  (1.651 m)   Wt 239 lb (108.4 kg)   SpO2 98%   BMI 39.77 kg/m     Wt Readings from Last 3 Encounters:  04/16/17 239 lb (108.4 kg)  04/11/17 220 lb (99.8 kg)  01/25/16 236 lb (107 kg)     GEN: Patient is in no acute distress HEENT: Normal NECK: No JVD; No carotid bruits LYMPHATICS: No lymphadenopathy CARDIAC: S1 S2 regular, 2/6 systolic murmur at the apex. RESPIRATORY:  Clear to auscultation without rales, wheezing or rhonchi  ABDOMEN: Soft, non-tender, non-distended MUSCULOSKELETAL:  No edema; No deformity  SKIN: Warm and dry NEUROLOGIC:  Alert and oriented x 3 PSYCHIATRIC:  Normal affect    Signed, Garwin Brothersajan R Devon Pretty, MD    04/16/2017 12:02 PM    [facility]

## 2017-04-17 ENCOUNTER — Telehealth: Payer: Self-pay

## 2017-04-17 NOTE — Telephone Encounter (Signed)
Patient will call to schedule a nurse visit appointment.

## 2017-04-19 ENCOUNTER — Other Ambulatory Visit: Payer: Self-pay

## 2017-04-19 ENCOUNTER — Ambulatory Visit (HOSPITAL_COMMUNITY): Payer: 59 | Attending: Cardiovascular Disease

## 2017-04-19 ENCOUNTER — Telehealth: Payer: Self-pay | Admitting: Cardiology

## 2017-04-19 DIAGNOSIS — E669 Obesity, unspecified: Secondary | ICD-10-CM | POA: Diagnosis present

## 2017-04-19 DIAGNOSIS — I5030 Unspecified diastolic (congestive) heart failure: Secondary | ICD-10-CM | POA: Diagnosis not present

## 2017-04-19 DIAGNOSIS — R011 Cardiac murmur, unspecified: Secondary | ICD-10-CM | POA: Diagnosis present

## 2017-04-19 DIAGNOSIS — R079 Chest pain, unspecified: Secondary | ICD-10-CM

## 2017-04-19 NOTE — Telephone Encounter (Signed)
Nurse visit appointment made.

## 2017-04-19 NOTE — Telephone Encounter (Signed)
Please call when you get back from lunch

## 2017-04-23 ENCOUNTER — Telehealth (HOSPITAL_COMMUNITY): Payer: Self-pay | Admitting: Cardiology

## 2017-04-24 NOTE — Telephone Encounter (Signed)
User: Luke Wade, Luke Wade A Date/time: 04/23/17 11:40 AM  Comment: Called pt and lmsg for him to C B to r/s [email or web address]  Context:  Outcome: Left Message  Phone number: [phone] Phone Type: Home Phone  Comm. type: Telephone Call type: Outgoing  Contact: Si RaiderMcGregor, Luke Y Relation to patient: Self

## 2017-04-25 ENCOUNTER — Ambulatory Visit (INDEPENDENT_AMBULATORY_CARE_PROVIDER_SITE_OTHER): Payer: 59 | Admitting: Cardiology

## 2017-04-25 VITALS — BP 144/72 | HR 83

## 2017-04-25 DIAGNOSIS — I10 Essential (primary) hypertension: Secondary | ICD-10-CM | POA: Diagnosis not present

## 2017-04-25 NOTE — Progress Notes (Signed)
Patient presented today for BP and pulse check due to Spironolactone-HTCZ combo therapy, per Dr. Tomie Chinaevankar would like for patient to keep a track of BP checks and mail them in, also labs were drawn today. Per Dr. Tomie Chinaevankar would like to see him back at regular physician schedule follow up.

## 2017-04-25 NOTE — Patient Instructions (Addendum)
Medication Instructions:  Your physician recommends that you continue on your current medications as directed. Please refer to the Current Medication list given to you today.   Labwork: BMP  Testing/Procedures: None   Follow-Up: Your physician recommends that you schedule a follow-up appointment in: Patient will keep current follow up Appointment    Any Other Special Instructions Will Be Listed Below (If Applicable). Per Revankar would like for him to keep checking blood pressure and mailing in to us      If you need a refill on your cardiac medications before your next appointment, please call your pharmacy.

## 2017-04-26 ENCOUNTER — Telehealth (HOSPITAL_COMMUNITY): Payer: Self-pay | Admitting: *Deleted

## 2017-04-26 ENCOUNTER — Encounter (HOSPITAL_COMMUNITY): Payer: 59

## 2017-04-26 LAB — BASIC METABOLIC PANEL
BUN / CREAT RATIO: 15 (ref 9–20)
BUN: 18 mg/dL (ref 6–20)
CO2: 23 mmol/L (ref 20–29)
CREATININE: 1.18 mg/dL (ref 0.76–1.27)
Calcium: 9.7 mg/dL (ref 8.7–10.2)
Chloride: 96 mmol/L (ref 96–106)
GFR calc non Af Amer: 78 mL/min/{1.73_m2} (ref >59)
GFR, EST AFRICAN AMERICAN: 91 mL/min/{1.73_m2} (ref >59)
GLUCOSE: 93 mg/dL (ref 65–99)
Potassium: 4.6 mmol/L (ref 3.5–5.2)
SODIUM: 134 mmol/L (ref 134–144)

## 2017-04-26 NOTE — Telephone Encounter (Signed)
Patient given detailed instructions per Myocardial Perfusion Study Information Sheet for the test on 05/01/17. Patient notified to arrive 15 minutes early and that it is imperative to arrive on time for appointment to keep from having the test rescheduled.  If you need to cancel or reschedule your appointment, please call the office within 24 hours of your appointment. . Patient verbalized understanding. Luke Wade, [place] Jacqueline, RN

## 2017-04-30 ENCOUNTER — Emergency Department (HOSPITAL_COMMUNITY): Payer: 59

## 2017-04-30 ENCOUNTER — Encounter (HOSPITAL_COMMUNITY): Payer: Self-pay | Admitting: Emergency Medicine

## 2017-04-30 ENCOUNTER — Emergency Department (HOSPITAL_COMMUNITY)
Admission: EM | Admit: 2017-04-30 | Discharge: 2017-04-30 | Disposition: A | Discharge status: Home / Self Care | Payer: 59 | Attending: Emergency Medicine | Admitting: Emergency Medicine

## 2017-04-30 DIAGNOSIS — Z79899 Other long term (current) drug therapy: Secondary | ICD-10-CM | POA: Diagnosis not present

## 2017-04-30 DIAGNOSIS — I10 Essential (primary) hypertension: Secondary | ICD-10-CM | POA: Diagnosis not present

## 2017-04-30 DIAGNOSIS — R55 Syncope and collapse: Secondary | ICD-10-CM | POA: Diagnosis not present

## 2017-04-30 DIAGNOSIS — R079 Chest pain, unspecified: Secondary | ICD-10-CM | POA: Insufficient documentation

## 2017-04-30 DIAGNOSIS — F17210 Nicotine dependence, cigarettes, uncomplicated: Secondary | ICD-10-CM | POA: Diagnosis not present

## 2017-04-30 LAB — BASIC METABOLIC PANEL
ANION GAP: 11 (ref 5–15)
BUN: 18 mg/dL (ref 6–20)
CHLORIDE: 102 mmol/L (ref 101–111)
CO2: 21 mmol/L — ABNORMAL LOW (ref 22–32)
Calcium: 9 mg/dL (ref 8.9–10.3)
Creatinine, Ser: 1.04 mg/dL (ref 0.61–1.24)
Glucose, Bld: 107 mg/dL — ABNORMAL HIGH (ref 65–99)
POTASSIUM: 4.4 mmol/L (ref 3.5–5.1)
SODIUM: 134 mmol/L — AB (ref 135–145)

## 2017-04-30 LAB — CBC
HEMATOCRIT: 45 % (ref 39.0–52.0)
HEMOGLOBIN: 15 g/dL (ref 13.0–17.0)
MCH: 25.6 pg — ABNORMAL LOW (ref 26.0–34.0)
MCHC: 33.3 g/dL (ref 30.0–36.0)
MCV: 76.9 fL — AB (ref 78.0–100.0)
Platelets: 258 10*3/uL (ref 150–400)
RBC: 5.85 MIL/uL — AB (ref 4.22–5.81)
RDW: 14 % (ref 11.5–15.5)
WBC: 8.2 10*3/uL (ref 4.0–10.5)

## 2017-04-30 LAB — I-STAT TROPONIN, ED
TROPONIN I, POC: 0.03 ng/mL (ref 0.00–0.08)
Troponin i, poc: 0 ng/mL (ref 0.00–0.08)

## 2017-04-30 IMAGING — DX DG CHEST 2V
2 series · 2 of 2 positions shown · non-contrast
Comparison: PA and lateral chest x-ray [DATE]

CLINICAL DATA: Onset of dizziness and lightheadedness today at work
with upper right-sided sharp chest pain occasionally over the past
month. Began a new blood pressure medicine last week.

EXAM:
CHEST - 2 VIEW

[w chest pa]
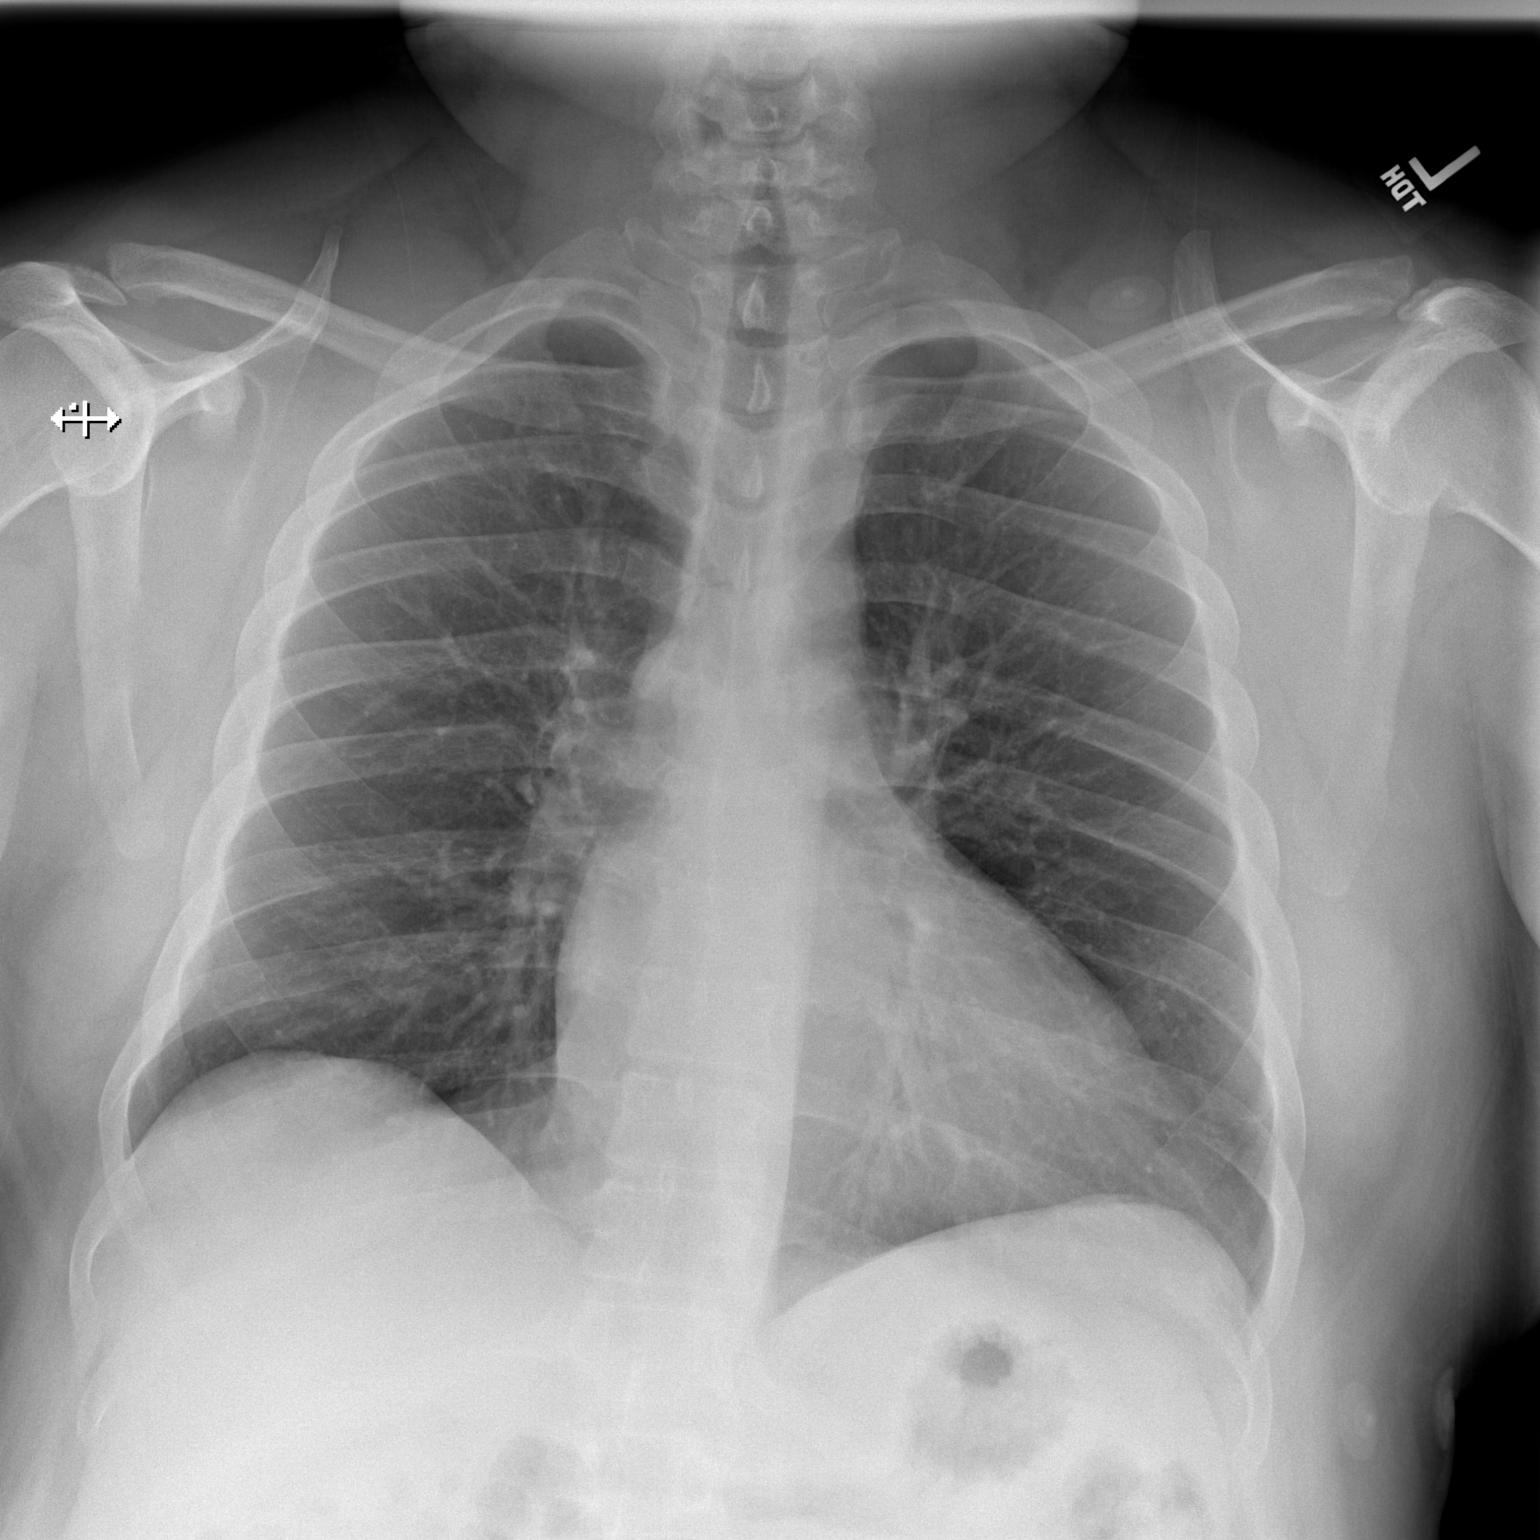

[w chest lat]
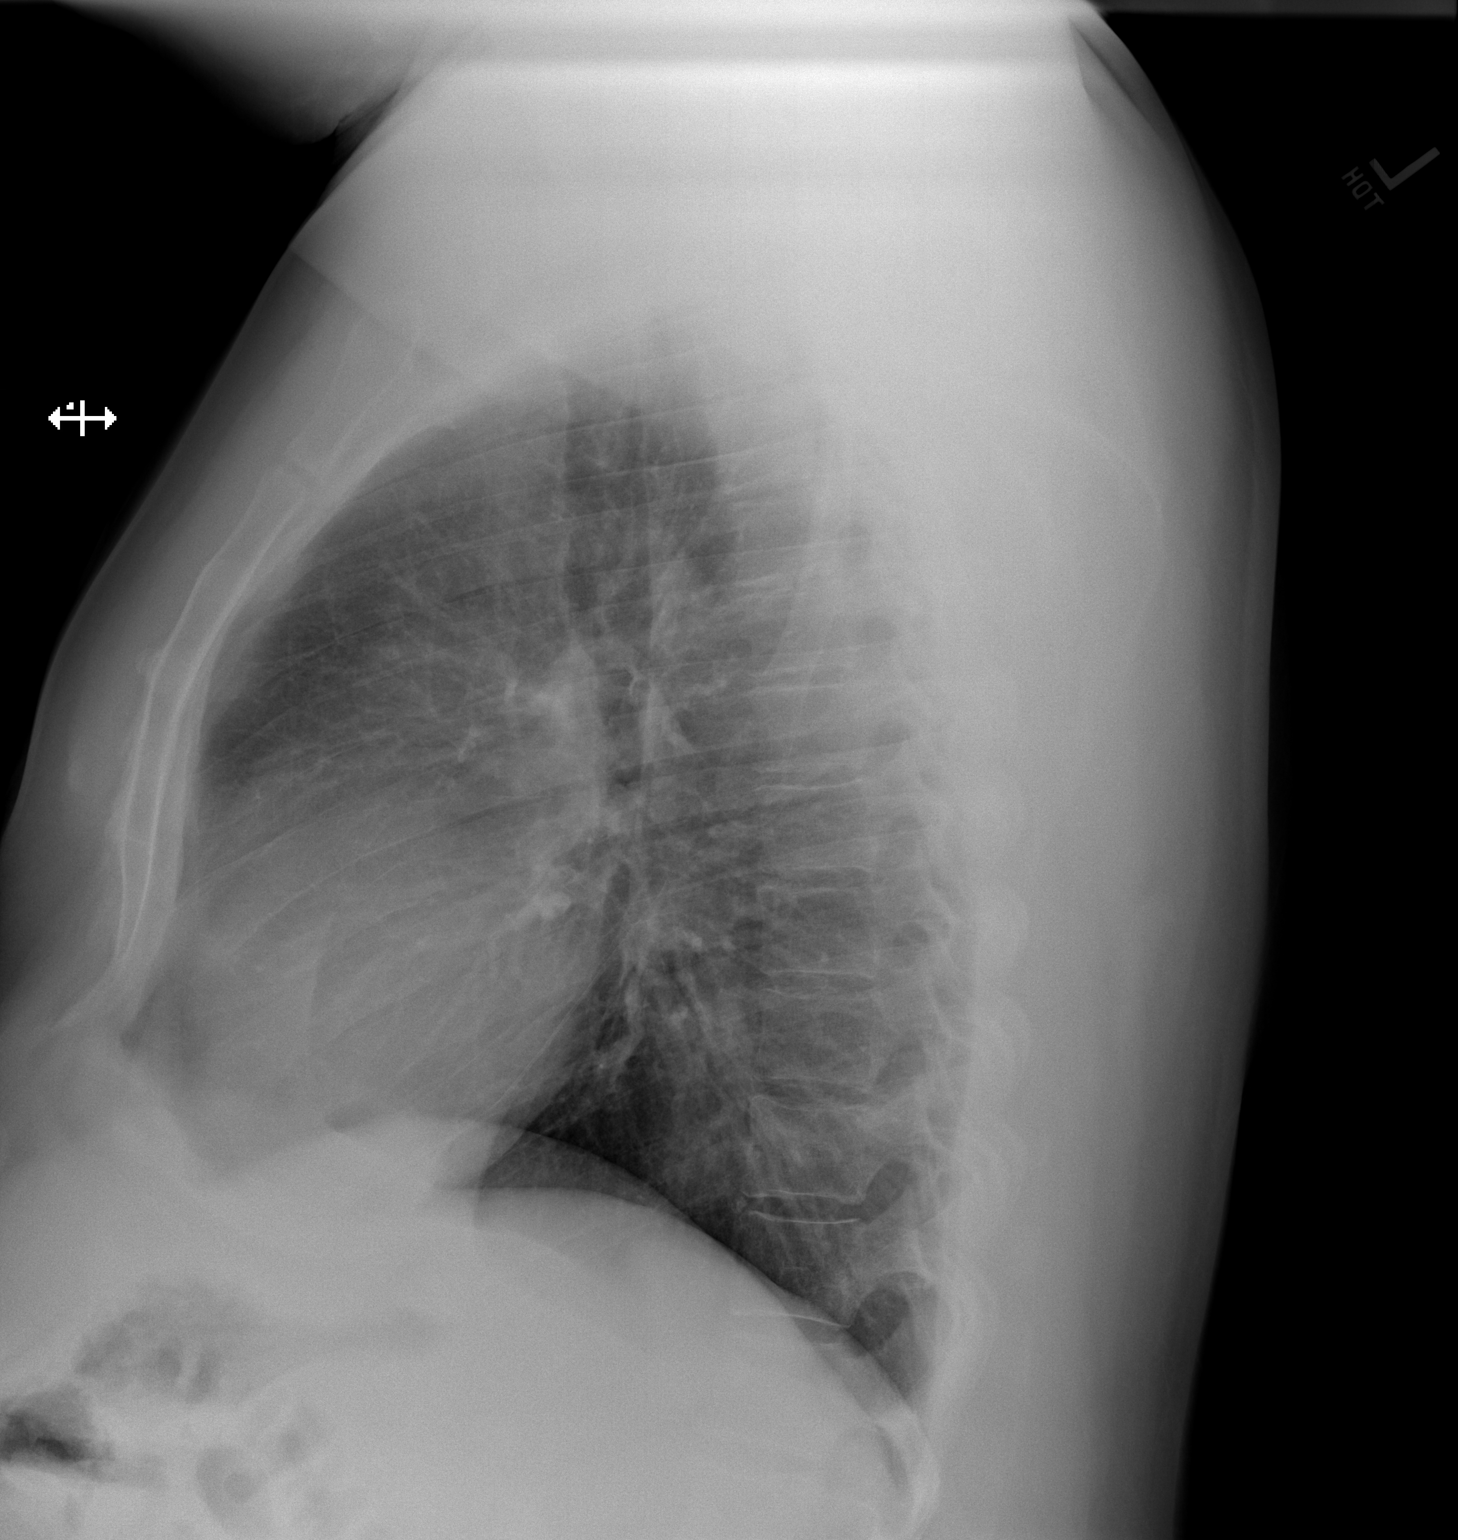

[2 of 2 positions shown; findings below may reference images not displayed]

FINDINGS: The lungs are adequately inflated and clear. The heart and pulmonary
vascularity are normal. There is no pleural effusion or
pneumothorax. The bony thorax is unremarkable.
IMPRESSION: There is no active cardiopulmonary disease.

## 2017-04-30 NOTE — ED Provider Notes (Signed)
[facility] Provider Note   CSN: [id number] Arrival date & time: 04/30/17  [id number]     History   Chief Complaint Chief Complaint  Patient presents with  . Chest Pain  . Near Syncope    HPI Luke Wade is a [age] y.o. male.  HPI Patient presents with near syncope and feeling bad.  States he was driving to work today and began to feel weird.  States he felt as if he was on drugs.  Has history of hypertension and recently started on antihypertensives.  States he had no chest pain today states that he has had some episodes of chest pain over the last couple weeks however.  He is scheduled for a stress test tomorrow and is seen cardiology.  The pain is in his right chest sharp.  Will come on for half hour at a time.  May come on with exertion will also sometimes come on at rest.  No swelling in his legs.  No fevers.  Felt a little dizzy earlier today but no chest pain associated with that episode. History reviewed. No pertinent past medical history.  Patient Active Problem List   Diagnosis Date Noted  . Chest pain 04/16/2017  . Hypertension 04/16/2017  . Cigarette smoker 04/16/2017  . Overweight 04/16/2017    History reviewed. No pertinent surgical history.      Home Medications    Prior to Admission medications   Medication Sig Start Date End Date Taking? Authorizing Provider  amLODipine-benazepril (LOTREL) 5-20 MG capsule Take by mouth daily. 04/11/17   [provider]  azithromycin (ZITHROMAX Z-PAK) 250 MG tablet 2 po day one, then 1 daily x 4 days 01/25/16   Geoffery Lyonselo, Douglas, MD  meloxicam (MOBIC) 15 MG tablet Take 15 mg by mouth daily. 04/11/17   [provider]  Phenylephrine-DM-GG-APAP (TYLENOL COLD/FLU SEVERE PO) Take 2 tablets by mouth 2 (two) times daily.    [provider]  spironolactone-hydrochlorothiazide (ALDACTAZIDE) 25-25 MG tablet Take 1 tablet by mouth daily. 04/16/17   Revankar, Aundra Dubinajan R, MD    Family  History Family History  Problem Relation Age of Onset  . Cancer Mother   . Cancer Father     Social History Social History   Tobacco Use  . Smoking status: Current Every Day Smoker    Packs/day: 0.25    Types: Cigarettes  . Smokeless tobacco: Never Used  Substance Use Topics  . Alcohol use: Yes    Comment: weekends   . Drug use: Not on file    Comment: occasionally     Allergies   Patient has no known allergies.   Review of Systems Review of Systems  Constitutional: Positive for diaphoresis. Negative for appetite change.  HENT: Negative for congestion.   Cardiovascular: Positive for chest pain.  Gastrointestinal: Negative for abdominal pain.  Genitourinary: Negative for flank pain.  Musculoskeletal: Negative for back pain.  Skin: Negative for rash.  Neurological: Negative for numbness.  Hematological: Negative for adenopathy.  Psychiatric/Behavioral: Negative for agitation and confusion.     Physical Exam Updated Vital Signs BP 122/76 (BP Location: Right Arm)   Pulse 99   Temp 98.1 F (36.7 C) (Oral)   Resp 14   SpO2 100%   Physical Exam  Constitutional: He appears well-developed.  HENT:  Head: Atraumatic.  Eyes: Pupils are equal, round, and reactive to light.  Cardiovascular: Normal rate and normal pulses.  Pulmonary/Chest: Effort normal and breath sounds normal.  Musculoskeletal:  Right lower leg: He exhibits no edema.       Left lower leg: He exhibits no edema.  Neurological: He is alert.  Skin: Skin is warm. Capillary refill takes less than 2 seconds.     ED Treatments / Results  Labs (all labs ordered are listed, but only abnormal results are displayed) Labs Reviewed  BASIC METABOLIC PANEL - Abnormal; Notable for the following components:      Result Value   Sodium 134 (*)    CO2 21 (*)    Glucose, Bld 107 (*)    All other components within normal limits  CBC - Abnormal; Notable for the following components:   RBC 5.85 (*)    MCV  76.9 (*)    MCH 25.6 (*)    All other components within normal limits  I-STAT TROPONIN, ED  I-STAT TROPONIN, ED    EKG EKG Interpretation  Date/Time:  Monday April 30 2017 09:17:49 EDT Ventricular Rate:  95 PR Interval:  136 QRS Duration: 90 QT Interval:  348 QTC Calculation: 437 R Axis:   64 Text Interpretation:  Normal sinus rhythm Left ventricular hypertrophy with repolarization abnormality Abnormal ECG since last tracing no significant change Confirmed by Eber Hong ([id number]) on 04/30/2017 9:20:50 AM Also confirmed by Benjiman Core [phone])  on 04/30/2017 1:18:20 PM   Radiology Dg Chest 2 View  Result Date: 04/30/2017 CLINICAL DATA:  Onset of dizziness and lightheadedness today at work with upper right-sided sharp chest pain occasionally over the past month. Began a new blood pressure medicine last week. EXAM: CHEST - 2 VIEW COMPARISON:  PA and lateral chest x-ray of April 11, 2017 FINDINGS: The lungs are adequately inflated and clear. The heart and pulmonary vascularity are normal. There is no pleural effusion or pneumothorax. The bony thorax is unremarkable. IMPRESSION: There is no active cardiopulmonary disease. Electronically Signed   By: David  [place] M.D.   On: 04/30/2017 10:52    Procedures Procedures (including critical care time)  Medications Ordered in ED Medications - No data to display   Initial Impression / Assessment and Plan / ED Course  I have reviewed the triage vital signs and the nursing notes.  Pertinent labs & imaging results that were available during my care of the patient were reviewed by me and considered in my medical decision making (see chart for details).     Patient with nonspecific symptoms.  Felt a little strange earlier.  Potentially he was hypotensive from new medicines.  However not hypotensive here.  EKG abnormal but stable.  Troponin negative x2.  Has stress test tomorrow morning.  I think at this point is reasonable for him to  discharge home for stress test tomorrow.  Will discharge home.  Discussed about potential changes in his medications   Final Clinical Impressions(s) / ED Diagnoses   Final diagnoses:  Near syncope  Nonspecific chest pain    ED Discharge Orders    None       Benjiman Core, MD 04/30/17 1429

## 2017-04-30 NOTE — ED Notes (Signed)
Pt noted to be diaphoretic in triage.

## 2017-04-30 NOTE — ED Notes (Signed)
PT given discharge paperwork with follow up instructions for tomorrow. No questions at discharge. PT ambulates out without difficulty

## 2017-04-30 NOTE — ED Triage Notes (Signed)
Pt to ER for evaluation of near syncopal feeling and dizziness onset this morning, reports recently placed on BP medication. Reports CP. Pt is a/o x4.

## 2017-04-30 NOTE — Discharge Instructions (Signed)
Follow-up for your stress test tomorrow as planned.

## 2017-05-01 ENCOUNTER — Ambulatory Visit (HOSPITAL_BASED_OUTPATIENT_CLINIC_OR_DEPARTMENT_OTHER): Payer: 59

## 2017-05-01 VITALS — Ht 65.0 in | Wt 239.0 lb

## 2017-05-01 DIAGNOSIS — R55 Syncope and collapse: Secondary | ICD-10-CM | POA: Diagnosis not present

## 2017-05-01 DIAGNOSIS — R079 Chest pain, unspecified: Secondary | ICD-10-CM

## 2017-05-01 DIAGNOSIS — Z79899 Other long term (current) drug therapy: Secondary | ICD-10-CM | POA: Diagnosis not present

## 2017-05-01 DIAGNOSIS — F17210 Nicotine dependence, cigarettes, uncomplicated: Secondary | ICD-10-CM | POA: Diagnosis not present

## 2017-05-01 DIAGNOSIS — I10 Essential (primary) hypertension: Secondary | ICD-10-CM

## 2017-05-01 LAB — MYOCARDIAL PERFUSION IMAGING
CHL CUP NUCLEAR SDS: 9
CHL CUP NUCLEAR SSS: 13
CSEPPHR: 100 {beats}/min
LHR: 0.28
LV dias vol: 159 mL (ref 62–150)
LVSYSVOL: 109 mL
Rest HR: 81 {beats}/min
SRS: 4
TID: 1.06

## 2017-05-01 IMAGING — NM NM MISC PROCEDURE
5 series · 30 of 30 positions shown · non-contrast
Comparison: none

[Series 1: wbr_r-proj_st rest · 6.51mm/px · 6 of 64 frames shown]
[frame 6/64]
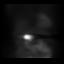
[frame 16/64]
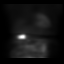
[frame 27/64]
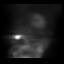
[frame 38/64]
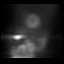
[frame 48/64]
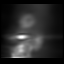
[frame 59/64]
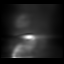

[Series 1: rest · 6.51mm/px · 6 of 64 frames shown]
[frame 6/64]
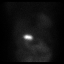
[frame 16/64]
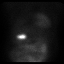
[frame 27/64]
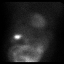
[frame 38/64]
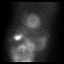
[frame 48/64]
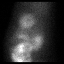
[frame 59/64]
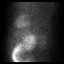

[Series 2: stress - gated · 6.51mm/px · 6 of 512 frames shown]
[frame 43/512]
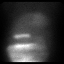
[frame 128/512]
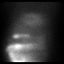
[frame 214/512]
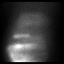
[frame 299/512]
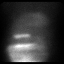
[frame 384/512]
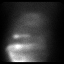
[frame 470/512]
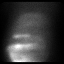

[Series 2: wbr_s-proj_st stress - gated · 6.51mm/px · 6 of 512 frames shown]
[frame 43/512]
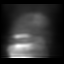
[frame 128/512]
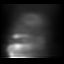
[frame 214/512]
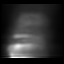
[frame 299/512]
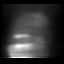
[frame 384/512]
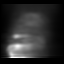
[frame 470/512]
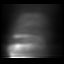

[Series 3: stress - perfusion · 6.51mm/px · 6 of 64 frames shown]
[frame 6/64]
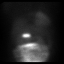
[frame 16/64]
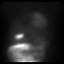
[frame 27/64]
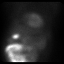
[frame 38/64]
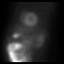
[frame 48/64]
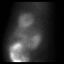
[frame 59/64]
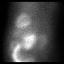

[30 of 30 positions shown; findings below may reference images not displayed]

Canned report from images found in remote index.

Refer to host system for actual result text.

## 2017-05-01 MED ORDER — REGADENOSON 0.4 MG/5ML IV SOLN
0.4000 mg | Freq: Once | INTRAVENOUS | Status: AC
Start: 2017-05-01 — End: 2017-05-01
  Administered 2017-05-01: 0.4 mg via INTRAVENOUS

## 2017-05-01 MED ORDER — TECHNETIUM TC 99M TETROFOSMIN IV KIT
31.7000 | PACK | Freq: Once | INTRAVENOUS | Status: AC | PRN
  Administered 2017-05-01: 31.7 via INTRAVENOUS
  Filled 2017-05-01: qty 32

## 2017-05-01 MED ORDER — TECHNETIUM TC 99M TETROFOSMIN IV KIT
10.3000 | PACK | Freq: Once | INTRAVENOUS | Status: AC | PRN
  Administered 2017-05-01: 10.3 via INTRAVENOUS
  Filled 2017-05-01: qty 11

## 2017-05-02 ENCOUNTER — Encounter: Payer: Self-pay | Admitting: Cardiology

## 2017-05-02 ENCOUNTER — Ambulatory Visit (INDEPENDENT_AMBULATORY_CARE_PROVIDER_SITE_OTHER): Payer: 59 | Admitting: Cardiology

## 2017-05-02 ENCOUNTER — Emergency Department (HOSPITAL_BASED_OUTPATIENT_CLINIC_OR_DEPARTMENT_OTHER): Payer: 59

## 2017-05-02 ENCOUNTER — Observation Stay (HOSPITAL_BASED_OUTPATIENT_CLINIC_OR_DEPARTMENT_OTHER)
Admission: EM | Admit: 2017-05-02 | Discharge: 2017-05-03 | Disposition: A | Discharge status: Home / Self Care | Payer: 59 | Attending: Interventional Cardiology | Admitting: Interventional Cardiology

## 2017-05-02 ENCOUNTER — Encounter (HOSPITAL_BASED_OUTPATIENT_CLINIC_OR_DEPARTMENT_OTHER): Payer: Self-pay | Admitting: Emergency Medicine

## 2017-05-02 ENCOUNTER — Other Ambulatory Visit: Payer: Self-pay

## 2017-05-02 VITALS — BP 154/70 | HR 100 | Resp 12 | Ht 65.0 in | Wt 231.1 lb

## 2017-05-02 DIAGNOSIS — E663 Overweight: Secondary | ICD-10-CM | POA: Diagnosis present

## 2017-05-02 DIAGNOSIS — E669 Obesity, unspecified: Secondary | ICD-10-CM | POA: Diagnosis not present

## 2017-05-02 DIAGNOSIS — R079 Chest pain, unspecified: Secondary | ICD-10-CM | POA: Diagnosis present

## 2017-05-02 DIAGNOSIS — I249 Acute ischemic heart disease, unspecified: Principal | ICD-10-CM | POA: Insufficient documentation

## 2017-05-02 DIAGNOSIS — R9439 Abnormal result of other cardiovascular function study: Secondary | ICD-10-CM | POA: Diagnosis present

## 2017-05-02 DIAGNOSIS — R0683 Snoring: Secondary | ICD-10-CM | POA: Insufficient documentation

## 2017-05-02 DIAGNOSIS — F17210 Nicotine dependence, cigarettes, uncomplicated: Secondary | ICD-10-CM | POA: Diagnosis present

## 2017-05-02 DIAGNOSIS — I10 Essential (primary) hypertension: Secondary | ICD-10-CM | POA: Diagnosis present

## 2017-05-02 DIAGNOSIS — Z79899 Other long term (current) drug therapy: Secondary | ICD-10-CM | POA: Diagnosis not present

## 2017-05-02 DIAGNOSIS — Z6837 Body mass index (BMI) 37.0-37.9, adult: Secondary | ICD-10-CM | POA: Insufficient documentation

## 2017-05-02 DIAGNOSIS — R42 Dizziness and giddiness: Secondary | ICD-10-CM | POA: Diagnosis not present

## 2017-05-02 DIAGNOSIS — R948 Abnormal results of function studies of other organs and systems: Secondary | ICD-10-CM | POA: Diagnosis not present

## 2017-05-02 DIAGNOSIS — R0602 Shortness of breath: Secondary | ICD-10-CM | POA: Diagnosis not present

## 2017-05-02 DIAGNOSIS — R9431 Abnormal electrocardiogram [ECG] [EKG]: Secondary | ICD-10-CM | POA: Diagnosis not present

## 2017-05-02 HISTORY — DX: Abnormal result of other cardiovascular function study: R94.39

## 2017-05-02 LAB — BASIC METABOLIC PANEL
Anion gap: 11 (ref 5–15)
BUN: 13 mg/dL (ref 6–20)
CALCIUM: 9.2 mg/dL (ref 8.9–10.3)
CHLORIDE: 101 mmol/L (ref 101–111)
CO2: 23 mmol/L (ref 22–32)
CREATININE: 1.03 mg/dL (ref 0.61–1.24)
GFR calc non Af Amer: >60 mL/min (ref >60)
Glucose, Bld: 102 mg/dL — ABNORMAL HIGH (ref 65–99)
Potassium: 4.2 mmol/L (ref 3.5–5.1)
Sodium: 135 mmol/L (ref 135–145)

## 2017-05-02 LAB — CBC
HCT: 44.8 % (ref 39.0–52.0)
Hemoglobin: 15.4 g/dL (ref 13.0–17.0)
MCH: 25.7 pg — AB (ref 26.0–34.0)
MCHC: 34.4 g/dL (ref 30.0–36.0)
MCV: 74.8 fL — AB (ref 78.0–100.0)
PLATELETS: 275 10*3/uL (ref 150–400)
RBC: 5.99 MIL/uL — ABNORMAL HIGH (ref 4.22–5.81)
RDW: 14.6 % (ref 11.5–15.5)
WBC: 9.3 10*3/uL (ref 4.0–10.5)

## 2017-05-02 LAB — TROPONIN I
TROPONIN I: 0.03 ng/mL — AB (ref <0.03)
Troponin I: 0.03 ng/mL (ref <0.03)

## 2017-05-02 IMAGING — CR DG CHEST 2V
2 series · 2 of 2 positions shown · non-contrast
Comparison: Radiographs [DATE].

CLINICAL DATA: Chest pain.

EXAM:
CHEST - 2 VIEW

[w chest pa]
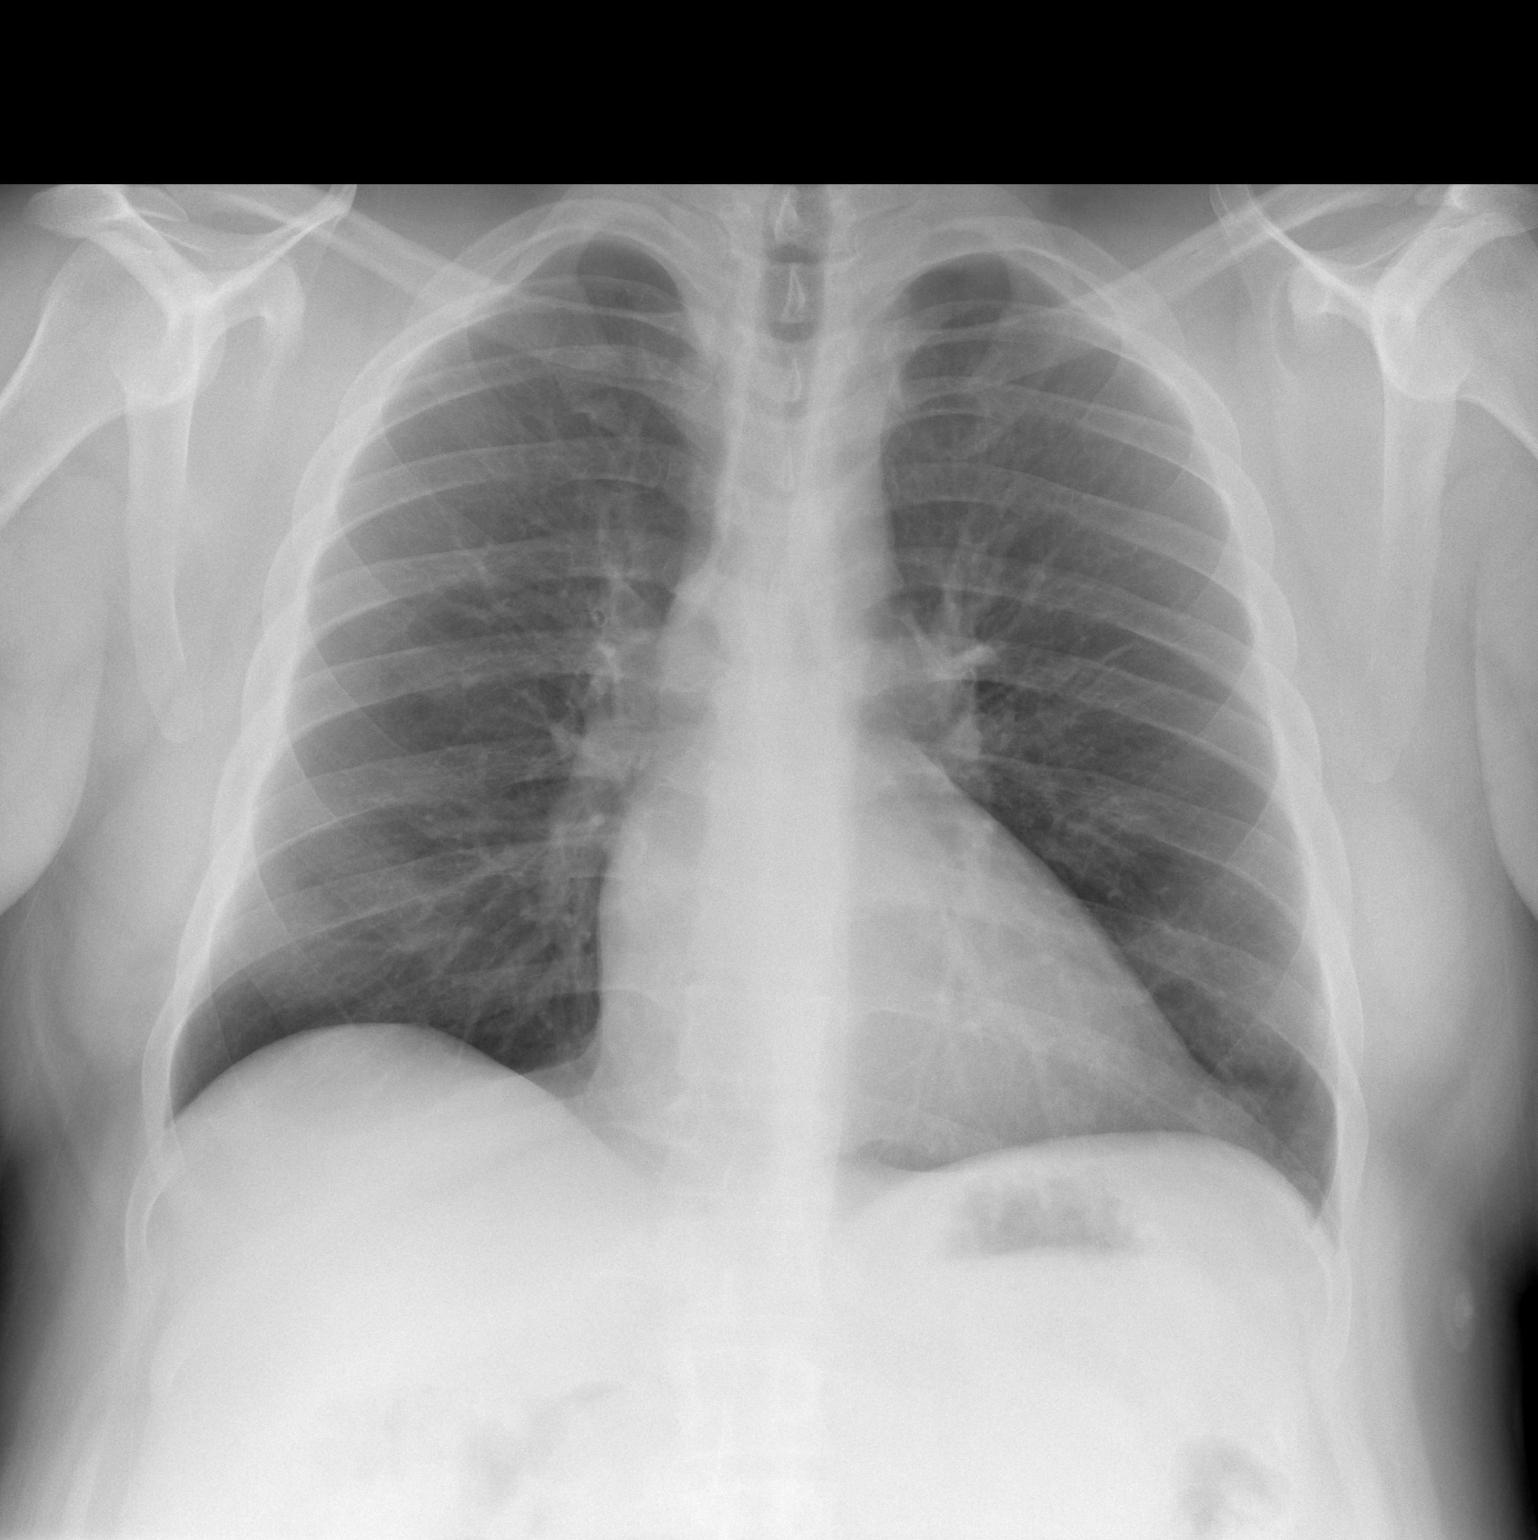

[w chest lat]
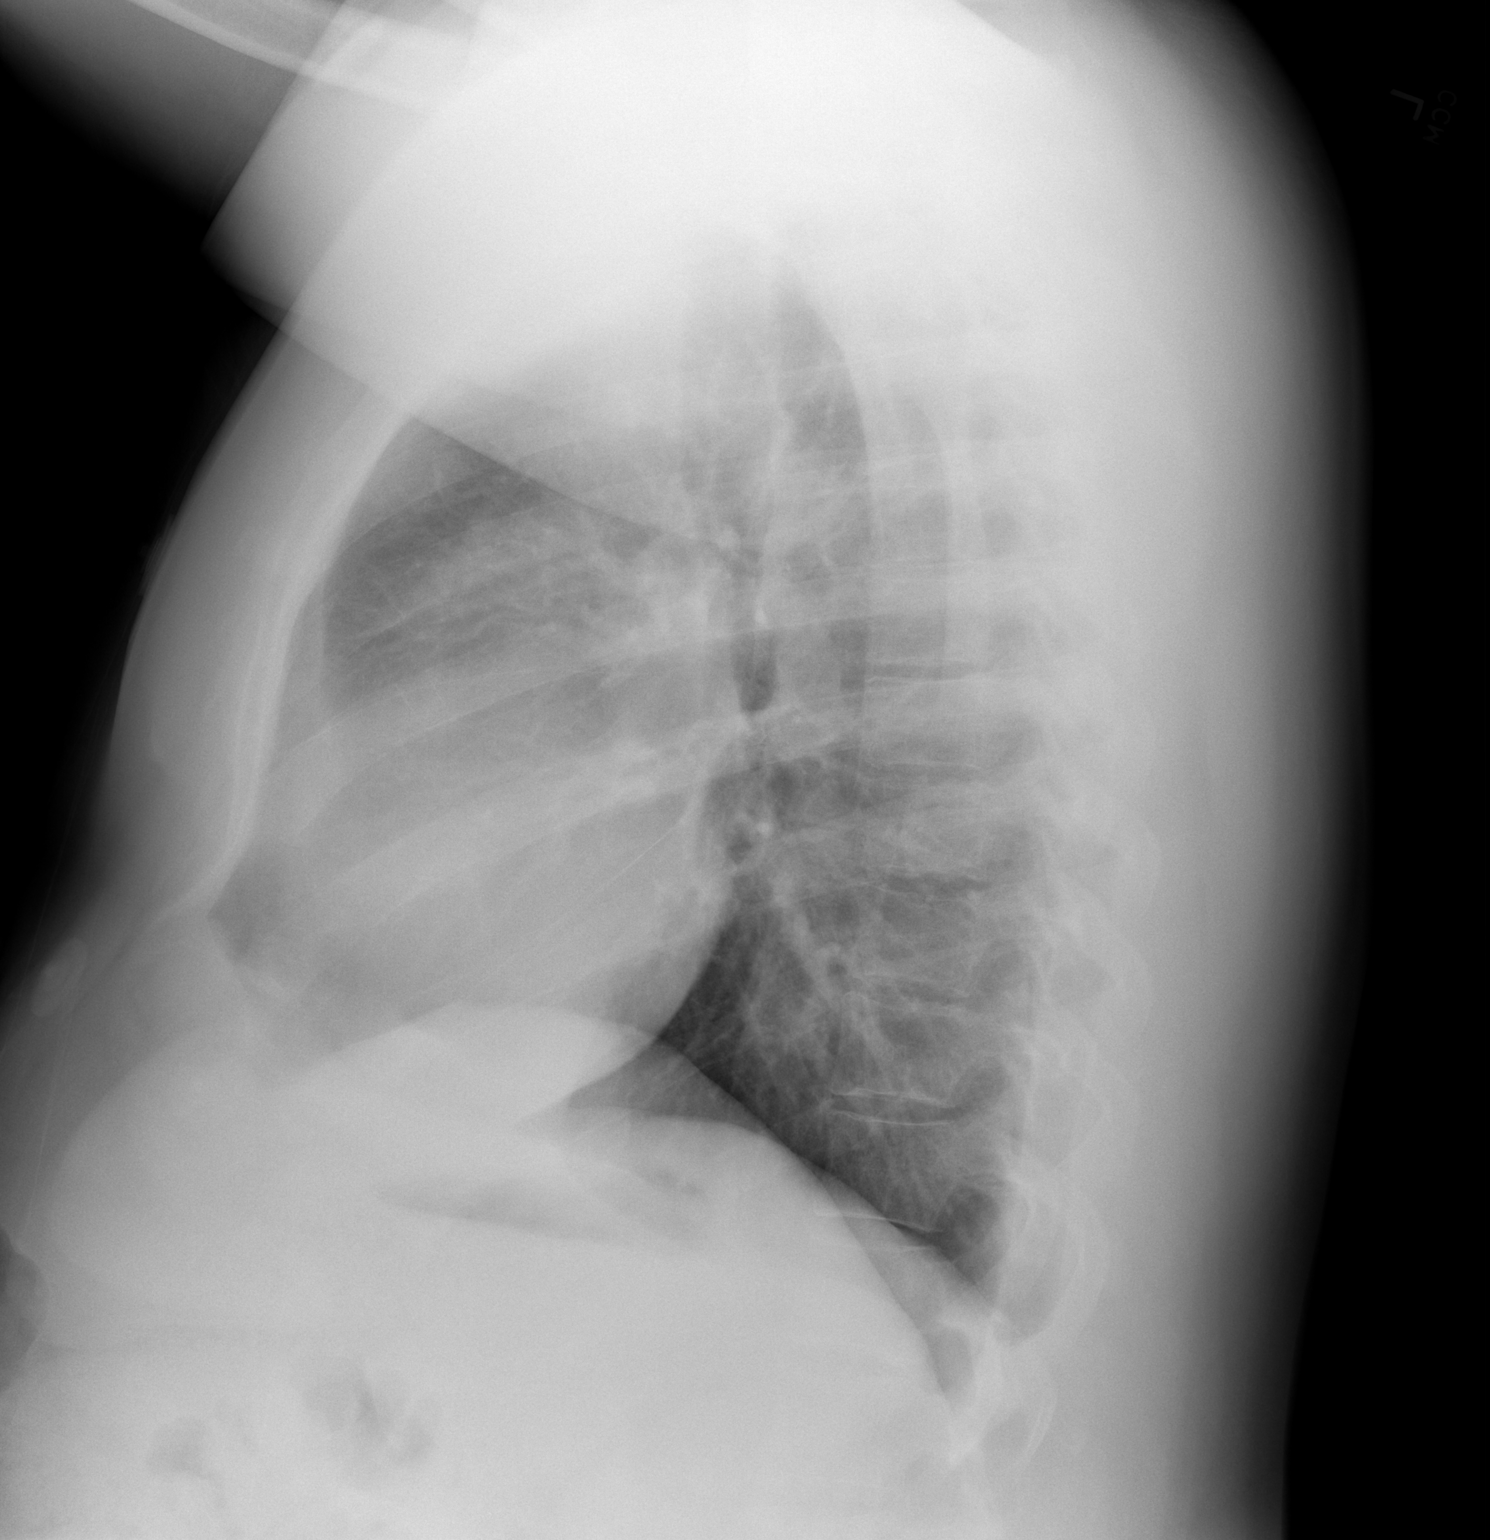

[2 of 2 positions shown; findings below may reference images not displayed]

FINDINGS: The heart size and mediastinal contours are within normal limits.
Both lungs are clear. No pneumothorax or pleural effusion is noted.
The visualized skeletal structures are unremarkable.
IMPRESSION: No active cardiopulmonary disease.

## 2017-05-02 MED ORDER — HEPARIN BOLUS VIA INFUSION
4000.0000 [IU] | Freq: Once | INTRAVENOUS | Status: AC
  Administered 2017-05-02: 4000 [IU] via INTRAVENOUS

## 2017-05-02 MED ORDER — ASPIRIN 81 MG PO CHEW
324.0000 mg | CHEWABLE_TABLET | Freq: Once | ORAL | Status: AC
  Administered 2017-05-02: 324 mg via ORAL
  Filled 2017-05-02: qty 4

## 2017-05-02 MED ORDER — HEPARIN (PORCINE) IN NACL 100-0.45 UNIT/ML-% IJ SOLN
1000.0000 [IU]/h | INTRAMUSCULAR | Status: DC
  Administered 2017-05-02: 1000 [IU]/h via INTRAVENOUS
  Filled 2017-05-02: qty 250

## 2017-05-02 NOTE — Progress Notes (Signed)
ANTICOAGULATION CONSULT NOTE - Initial Consult  Pharmacy Consult for heparin Indication: chest pain/ACS  No Known Allergies  Patient Measurements: Height: 5\' 5"  (165.1 cm) Weight: 231 lb (104.8 kg) IBW/kg (Calculated) : 61.5 Heparin Dosing Weight: 85 kg  Vital Signs: Temp: 97.9 F (36.6 C) (03/27 1705) Temp Source: Oral (03/27 1705) BP: 136/62 (03/27 1830) Pulse Rate: 92 (03/27 1830)  Labs: Recent Labs    04/30/17 0929 05/02/17 1724  HGB 15.0 15.4  HCT 45.0 44.8  PLT 258 275  CREATININE 1.04 1.03  TROPONINI  --  0.03*    Estimated Creatinine Clearance: 109.4 mL/min (by C-G formula based on SCr of 1.03 mg/dL).  Assessment: CC/HPI: 37 yo p [facility] - [facility]  PMH: HTN  Anticoag: nona pta - iv hep for r/o acs  Renal: SCr 1.03  Heme/Onc: H&H 15.4/44.8, Plt 275  Goal of Therapy:  Heparin level 0.3-0.7 units/ml Monitor platelets by anticoagulation protocol: Yes   Plan:  Heparin bolus 4000 units x 1 Heparin gtt 1000 units/hr Initial lvl 0100 Daily HL CBC  Plans for cath thu  Isaac BlissMichael Amayrani Bennick, PharmD, BCPS, BCCCP Clinical Pharmacist Clinical phone for 05/02/2017 from 1430 - 2300: [phone] If after 2300, please call main pharmacy at: [phone] 05/02/2017 6:42 PM

## 2017-05-02 NOTE — Progress Notes (Signed)
Cardiology Office Note:    Date:  05/02/2017   ID:  Luke Wade, DOB 05-15-1980, MRN [id number]  PCP:  Patient, No Pcp Per  Cardiologist:  Norman HerrlichBrian Purcell Jungbluth, MD    Referring MD: No ref. provider found    ASSESSMENT:    1. Essential hypertension   2. Abnormal myocardial perfusion study    PLAN:    In order of problems listed above:  1. Unstable symptoms consistent with acute coronary syndrome whether or not his enzymes are positive he has a markedly abnormal EKG at rest abnormal myocardial perfusion study multiple ED visits for chest pain and will be treated as acute coronary syndrome as an inpatient. 2. Hypertension stable continue current treatment I will have him start a beta-blocker and continue his medications with diuretic calcium channel blocker and ACE.  He will also benefit from a statin if enzymes are abnormal Lovenox.   3. He snores severely may require an outpatient sleep test    Next appointment: 2 weeks   Medication Adjustments/Labs and Tests Ordered: Current medicines are reviewed at length with the patient today.  Concerns regarding medicines are outlined above.  No orders of the defined types were placed in this encounter.  No orders of the defined types were placed in this encounter.   Chief Complaint  Patient presents with  . Follow up on Nuclear    History of Present Illness:    Luke Wade is a 37 y.o. male with a hx of chest pain and abnormal EKG with marked LVH and repolarization changes with 2 ED visits last seen by Dr Tomie Chinaevankar 04/16/17. His MPI was markedly abnormal and contacted to be seen by me in office.  Study Highlights    Nuclear stress EF: 31%.  There was no ST segment deviation noted during stress.  Defect 1: There is a medium defect of moderate severity present in the basal inferolateral, mid inferolateral and apical inferior location.  Findings consistent with ischemia.  This is an intermediate risk study.  The left  ventricular ejection fraction is moderately decreased (30-44%). There is a medium size area mild severity reversible defect in the basal and mid inferolateral walls consistent with ischemia. LVEF is moderately decreased.   Echo 04/19/17 Study Conclusions - Left ventricle: The cavity size was normal. Wall thickness was   increased in a pattern of severe LVH. Systolic function was   vigorous. The estimated ejection fraction was in the range of 65%   to 70%. Doppler parameters are consistent with abnormal left   ventricular relaxation (grade 1 diastolic dysfunction).  Compliance with diet, lifestyle and medications: Yes He is brought to the office today because of an abnormal nuclear stress test.  He has had 2 emergency room visits with chest pain markedly abnormal EKG and tells me that last night he had severe chest pain waxing and waning through the night.  He has episodes of a sharp chest pain substernal to the right of the sternum radiates through to his back and last for hours at a time it occurs typically at night unrelieved with rest but he also has exertional shortness of breath and at times he gets the sensation when he walks faster or longer distance.  His background history of hypertension and LVH she has a soft systolic ejection murmur and may have a hypertrophic cardiomyopathy but with multiple ED visits abnormal EKG and severe symptoms last night I feel is best that he be assessed for acute coronary syndrome admitted heart  catheterization tomorrow.  I called the card master and advice was to send him to the emergency room at med center [facility].  Presently having no chest pain no shortness of breath.  He has normal renal function no dye allergy no history of congenital rheumatic heart disease.  His risk factors are noteworthy for obesity hypertension cigarette smoking. Past Medical History:  Diagnosis Date  . Abnormal myocardial perfusion study 05/02/2017  . Chest pain 04/16/2017  .  Cigarette smoker 04/16/2017  . Hypertension 04/16/2017  . Overweight 04/16/2017    Past Surgical History:  Procedure Laterality Date  . NO PAST SURGERIES      Current Medications: Current Meds  Medication Sig  . amLODipine-benazepril (LOTREL) 5-20 MG capsule Take by mouth daily.  Marland Kitchen azithromycin (ZITHROMAX Z-PAK) 250 MG tablet 2 po day one, then 1 daily x 4 days  . meloxicam (MOBIC) 15 MG tablet Take 15 mg by mouth daily.  Marland Kitchen Phenylephrine-DM-GG-APAP (TYLENOL COLD/FLU SEVERE PO) Take 2 tablets by mouth 2 (two) times daily.  Marland Kitchen spironolactone-hydrochlorothiazide (ALDACTAZIDE) 25-25 MG tablet Take 1 tablet by mouth daily.     Allergies:   Patient has no known allergies.   Social History   Socioeconomic History  . Marital status: Single    Spouse name: Not on file  . Number of children: Not on file  . Years of education: Not on file  . Highest education level: Not on file  Occupational History  . Not on file  Social Needs  . Financial resource strain: Not on file  . Food insecurity:    Worry: Not on file    Inability: Not on file  . Transportation needs:    Medical: Not on file    Non-medical: Not on file  Tobacco Use  . Smoking status: Former Smoker    Packs/day: 0.25    Types: Cigarettes  . Smokeless tobacco: Never Used  Substance and Sexual Activity  . Alcohol use: Yes    Comment: weekends   . Drug use: Not Currently    Types: Marijuana    Comment: occasionally  . Sexual activity: Not on file  Lifestyle  . Physical activity:    Days per week: Not on file    Minutes per session: Not on file  . Stress: Not on file  Relationships  . Social connections:    Talks on phone: Not on file    Gets together: Not on file    Attends religious service: Not on file    Active member of club or organization: Not on file    Attends meetings of clubs or organizations: Not on file    Relationship status: Not on file  Other Topics Concern  . Not on file  Social History  Narrative  . Not on file     Family History: The patient's family history includes Cancer in his father and mother. ROS:   Please see the history of present illness.    All other systems reviewed and are negative.  EKGs/Labs/Other Studies Reviewed:    The following studies were reviewed today:  EKG:  EKG ordered today.  The ekg ordered today demonstrates [facility] LVH repolarization  Recent Labs: 04/11/2017: ALT 20 04/30/2017: BUN 18; Creatinine, Ser 1.04; Hemoglobin 15.0; Platelets 258; Potassium 4.4; Sodium 134  Recent Lipid Panel No results found for: CHOL, TRIG, HDL, CHOLHDL, VLDL, LDLCALC, LDLDIRECT  Physical Exam:    VS:  BP (!) 154/70   Pulse 100   Resp 12  Ht 5\' 5"  (1.651 m)   Wt 231 lb 1.9 oz (104.8 kg)   BMI 38.46 kg/m     Wt Readings from Last 3 Encounters:  05/02/17 231 lb 1.9 oz (104.8 kg)  05/01/17 239 lb (108.4 kg)  04/16/17 239 lb (108.4 kg)     GEN:  Well nourished, well developed in no acute distress HEENT: Normal NECK: No JVD; No carotid bruits LYMPHATICS: No lymphadenopathy CARDIAC: 1/6 SEM aortic areaRRR, no murmurs, rubs, gallops RESPIRATORY:  Clear to auscultation without rales, wheezing or rhonchi  ABDOMEN: Soft, non-tender, non-distended MUSCULOSKELETAL:  No edema; No deformity  SKIN: Warm and dry NEUROLOGIC:  Alert and oriented x 3 PSYCHIATRIC:  Normal affect    Signed, Norman Herrlich, MD  05/02/2017 4:47 PM    [facility]

## 2017-05-02 NOTE — ED Triage Notes (Signed)
Patient reports to ER with c/o chest pain.  Reports that the pain is worse with activity.  States that he had a stress test performed yesterday and followed up today with Dr. Caryn SectionMunley-states abnormal EKG and referred to ER for further workup.  Dr. Dulce SellarMunley states he wants patient to have cardiac cath but no beds available at [facility] to sent pt to ER.

## 2017-05-02 NOTE — ED Notes (Signed)
[facility] arrived to transport pt at this time.  

## 2017-05-02 NOTE — Progress Notes (Signed)
   Sp[facility] with [facility] Dalene [facility]elt[facility]Schlossman [facility].  I have written holding orders.  Precath orders have also been written.  There is an incomplete note by Dr. Dulce SellarMunley in the incomplete note section.  Patient will need to be seen by the overnight physician when he arrives.  He would need to have the procedure and risks discussed.

## 2017-05-02 NOTE — ED Notes (Signed)
ED Provider at bedside. 

## 2017-05-02 NOTE — ED Provider Notes (Signed)
[facility]ATH [facility]OV[facility] Provider Note   CSN: [id number] Arrival date & time: 05/02/17  1656     History   Chief Complaint Chief Complaint  Patient presents with  . Chest Pain    HPI Si RaiderDon Y Christianson is a 37 y.o. male.  HPI   For the last 2 months has had chest pain intermittently, seen in ED twice and had stress test done yesterday and saw Cardiology today and was sent here for admission to Cardiology.  Started taking nexium because thought maybe it was reflux, had felt better but last night woke up in the middle of the night again with chest pain, Felt like sharp pain right side of chest radiating to the head and then down to back. 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  Has been on and off for last few months but last few weeks has been worse.  Shortness of breath with it. No nausea or voming.  No leg pain or swelling, no long distance travel, surgeries, hx of DVT.  No known family hx of heart disease. Smoked stopped 3 weeks ago.  Feels worse with exertion but is present at rest as well.  Last night hurt for several hours, not having pain at this time.     Past Medical History:  Diagnosis Date  . Abnormal myocardial perfusion study 05/02/2017  . Chest pain 04/16/2017  . Cigarette smoker 04/16/2017  . Hypertension 04/16/2017  . Overweight 04/16/2017    Patient Active Problem List   Diagnosis Date Noted  . Unstable angina (HCC) 05/03/2017  . Abnormal myocardial perfusion study 05/02/2017  . ACS (acute coronary syndrome) (HCC) 05/02/2017  . Chest pain 04/16/2017  . Hypertension 04/16/2017  . Cigarette smoker 04/16/2017  . Overweight 04/16/2017    Past Surgical History:  Procedure Laterality Date  . NO PAST SURGERIES          Home Medications    Prior to Admission medications   Medication Sig Start Date End Date Taking? Authorizing Provider  Aspirin-Caffeine (BAYER BACK & BODY) 500-32.5 MG TABS Take 2 tablets by  mouth daily.    [provider]  esomeprazole (NEXIUM) 20 MG capsule Take 20 mg by mouth daily at 12 noon.    [provider]  spironolactone-hydrochlorothiazide (ALDACTAZIDE) 25-25 MG tablet Take 1 tablet by mouth daily. 04/16/17   Revankar, Aundra Dubinajan R, MD    Family History Family History  Problem Relation Age of Onset  . Cancer Mother   . Cancer Father     Social History Social History   Tobacco Use  . Smoking status: Former Smoker    Packs/day: 0.25    Types: Cigarettes  . Smokeless tobacco: Never Used  Substance Use Topics  . Alcohol use: Yes    Comment: weekends   . Drug use: Not Currently    Types: Marijuana    Comment: occasionally     Allergies   Patient has no known allergies.   Review of Systems Review of Systems  Constitutional: Negative for fever.  HENT: Negative for sore throat.   Eyes: Negative for visual disturbance.  Respiratory: Positive for shortness of breath.   Cardiovascular: Positive for chest pain.  Gastrointestinal: Negative for abdominal pain, nausea and vomiting.  Genitourinary: Negative for difficulty urinating.  Musculoskeletal: Negative for back pain and neck stiffness.  Skin: Negative for rash.  Neurological: Positive for light-headedness. Negative for syncope and headaches.     Physical Exam  Updated Vital Signs BP (!) 146/72   Pulse 82   Temp 98.2 F (36.8 C) (Oral)   Resp 16   Ht 5\' 5"  (1.651 m)   Wt 103.5 kg (228 lb 2.8 oz)   SpO2 99%   BMI 37.97 kg/m   Physical Exam  Constitutional: He is oriented to person, place, and time. He appears well-developed and well-nourished. No distress.  HENT:  Head: Normocephalic and atraumatic.  Eyes: Conjunctivae and EOM are normal.  Neck: Normal range of motion.  Cardiovascular: Normal rate, regular rhythm, normal heart sounds and intact distal pulses. Exam reveals no gallop and no friction rub.  No murmur heard. Pulmonary/Chest: Effort normal and breath sounds  normal. No respiratory distress. He has no wheezes. He has no rales.  Abdominal: Soft. He exhibits no distension. There is no tenderness. There is no guarding.  Musculoskeletal: He exhibits no edema.  Neurological: He is alert and oriented to person, place, and time.  Skin: Skin is warm and dry. He is not diaphoretic.  Nursing note and vitals reviewed.    ED Treatments / Results  Labs (all labs ordered are listed, but only abnormal results are displayed) Labs Reviewed  BASIC METABOLIC PANEL - Abnormal; Notable for the following components:      Result Value   Glucose, Bld 102 (*)    All other components within normal limits  CBC - Abnormal; Notable for the following components:   RBC 5.99 (*)    MCV 74.8 (*)    MCH 25.7 (*)    All other components within normal limits  TROPONIN I - Abnormal; Notable for the following components:   Troponin I 0.03 (*)    All other components within normal limits  TROPONIN I - Abnormal; Notable for the following components:   Troponin I 0.03 (*)    All other components within normal limits  CBC  HIV ANTIBODY (ROUTINE TESTING)  COMPREHENSIVE METABOLIC PANEL  TSH  HEMOGLOBIN A1C  BRAIN NATRIURETIC PEPTIDE  PROTIME-INR  LIPID PANEL  TROPONIN I  TROPONIN I  TROPONIN I  MAGNESIUM  HEPARIN LEVEL (UNFRACTIONATED)    EKG EKG Interpretation  Date/Time:  Wednesday May 02 2017 17:12:50 EDT Ventricular Rate:  99 PR Interval:  136 QRS Duration: 94 QT Interval:  342 QTC Calculation: 438 R Axis:   65 Text Interpretation:  Normal sinus rhythm Left ventricular hypertrophy with repolarization abnormality Abnormal ECG Similar repolarization abnormality to prior Confirmed by Alvira Monday ([id number]) on 05/02/2017 5:36:34 PM   Radiology Dg Chest 2 View  Result Date: 05/02/2017 CLINICAL DATA:  Chest pain. EXAM: CHEST - 2 VIEW COMPARISON:  Radiographs of April 30, 2017. FINDINGS: The heart size and mediastinal contours are within normal limits.  Both lungs are clear. No pneumothorax or pleural effusion is noted. The visualized skeletal structures are unremarkable. IMPRESSION: No active cardiopulmonary disease. Electronically Signed   By: Lupita Raider, M.D.   On: 05/02/2017 17:25    Procedures .Critical Care Performed by: Alvira Monday, MD Authorized by: Alvira Monday, MD   Critical care provider statement:    Critical care time (minutes):  30   Critical care was necessary to treat or prevent imminent or life-threatening deterioration of the following conditions:  Cardiac failure   Critical care was time spent personally by me on the following activities:  Discussions with consultants, review of old charts, ordering and review of radiographic studies and ordering and review of laboratory studies   (including critical care time)  Medications  Ordered in ED Medications  aspirin EC tablet 81 mg (has no administration in time range)  nitroGLYCERIN (NITROSTAT) SL tablet 0.4 mg (has no administration in time range)  acetaminophen (TYLENOL) tablet 650 mg (has no administration in time range)  ondansetron (ZOFRAN) injection 4 mg (has no administration in time range)  nitroGLYCERIN 50 mg in dextrose 5 % 250 mL (0.2 mg/mL) infusion (0 mcg/min Intravenous Stopped 05/03/17 0118)  metoprolol tartrate (LOPRESSOR) tablet 25 mg (25 mg Oral Given 05/03/17 0100)  atorvastatin (LIPITOR) tablet 80 mg (has no administration in time range)  sodium chloride flush (NS) 0.9 % injection 3 mL (3 mLs Intravenous Given 05/03/17 0047)  sodium chloride flush (NS) 0.9 % injection 3 mL (has no administration in time range)  0.9 %  sodium chloride infusion (has no administration in time range)  aspirin chewable tablet 81 mg (has no administration in time range)  heparin ADULT infusion 100 units/mL (25000 units/225mL sodium chloride 0.45%) (1,000 Units/hr Intravenous Transfusing/Transfer 05/02/17 2319)  0.9% sodium chloride infusion (3 mL/kg/hr  103.5 kg  Intravenous Not Given 05/03/17 0049)    Followed by  0.9% sodium chloride infusion (has no administration in time range)  aspirin chewable tablet 324 mg (324 mg Oral Given 05/02/17 1743)  aspirin chewable tablet 324 mg (0 mg Oral Duplicate 05/03/17 0055)    Or  aspirin suppository 300 mg ( Rectal See Alternative 05/03/17 0055)  heparin bolus via infusion 4,000 Units (4,000 Units Intravenous Bolus from Bag 05/02/17 1852)     Initial Impression / Assessment and Plan / ED Course  I have reviewed the triage vital signs and the nursing notes.  Pertinent labs & imaging results that were available during my care of the patient were reviewed by me and considered in my medical decision making (see chart for details).     37yo male with history of htn, recent evaluations for chest pain, presents from Cardiology office after he presented with chest pain and had abnormal myocardial perfusion study yesterday.  Dr. Dulce Sellar wished to admit patient to Cardiology however no beds were available and he was sent to ED.  Have low suspicion for PE, dissection by hx and physical exam. Troponin .03. Concern for possible unstable angina/NSTEMI given slight elevation of troponin.  Started on heparin gtt and given aspirin. CP free at this time. Discussed with Dr. Katrinka Blazing and we will admit for further care.   Final Clinical Impressions(s) / ED Diagnoses   Final diagnoses:  ACS (acute coronary syndrome) [facility])    ED Discharge Orders    None       Alvira Monday, MD 05/03/17 [phone]

## 2017-05-02 NOTE — H&P (View-Only) (Signed)
Cardiology Office Note:    Date:  05/02/2017   ID:  Luke Wade, DOB 05-15-1980, MRN [id number]  PCP:  Patient, No Pcp Per  Cardiologist:  Norman HerrlichBrian , MD    Referring MD: No ref. provider found    ASSESSMENT:    1. Essential hypertension   2. Abnormal myocardial perfusion study    PLAN:    In order of problems listed above:  1. Unstable symptoms consistent with acute coronary syndrome whether or not his enzymes are positive he has a markedly abnormal EKG at rest abnormal myocardial perfusion study multiple ED visits for chest pain and will be treated as acute coronary syndrome as an inpatient. 2. Hypertension stable continue current treatment I will have him start a beta-blocker and continue his medications with diuretic calcium channel blocker and ACE.  He will also benefit from a statin if enzymes are abnormal Lovenox.   3. He snores severely may require an outpatient sleep test    Next appointment: 2 weeks   Medication Adjustments/Labs and Tests Ordered: Current medicines are reviewed at length with the patient today.  Concerns regarding medicines are outlined above.  No orders of the defined types were placed in this encounter.  No orders of the defined types were placed in this encounter.   Chief Complaint  Patient presents with  . Follow up on Nuclear    History of Present Illness:    Luke Wade is a 37 y.o. male with a hx of chest pain and abnormal EKG with marked LVH and repolarization changes with 2 ED visits last seen by Dr Tomie Chinaevankar 04/16/17. His MPI was markedly abnormal and contacted to be seen by me in office.  Study Highlights    Nuclear stress EF: 31%.  There was no ST segment deviation noted during stress.  Defect 1: There is a medium defect of moderate severity present in the basal inferolateral, mid inferolateral and apical inferior location.  Findings consistent with ischemia.  This is an intermediate risk study.  The left  ventricular ejection fraction is moderately decreased (30-44%). There is a medium size area mild severity reversible defect in the basal and mid inferolateral walls consistent with ischemia. LVEF is moderately decreased.   Echo 04/19/17 Study Conclusions - Left ventricle: The cavity size was normal. Wall thickness was   increased in a pattern of severe LVH. Systolic function was   vigorous. The estimated ejection fraction was in the range of 65%   to 70%. Doppler parameters are consistent with abnormal left   ventricular relaxation (grade 1 diastolic dysfunction).  Compliance with diet, lifestyle and medications: Yes He is brought to the office today because of an abnormal nuclear stress test.  He has had 2 emergency room visits with chest pain markedly abnormal EKG and tells me that last night he had severe chest pain waxing and waning through the night.  He has episodes of a sharp chest pain substernal to the right of the sternum radiates through to his back and last for hours at a time it occurs typically at night unrelieved with rest but he also has exertional shortness of breath and at times he gets the sensation when he walks faster or longer distance.  His background history of hypertension and LVH she has a soft systolic ejection murmur and may have a hypertrophic cardiomyopathy but with multiple ED visits abnormal EKG and severe symptoms last night I feel is best that he be assessed for acute coronary syndrome admitted heart  catheterization tomorrow.  I called the card master and advice was to send him to the emergency room at med center [facility].  Presently having no chest pain no shortness of breath.  He has normal renal function no dye allergy no history of congenital rheumatic heart disease.  His risk factors are noteworthy for obesity hypertension cigarette smoking. Past Medical History:  Diagnosis Date  . Abnormal myocardial perfusion study 05/02/2017  . Chest pain 04/16/2017  .  Cigarette smoker 04/16/2017  . Hypertension 04/16/2017  . Overweight 04/16/2017    Past Surgical History:  Procedure Laterality Date  . NO PAST SURGERIES      Current Medications: Current Meds  Medication Sig  . amLODipine-benazepril (LOTREL) 5-20 MG capsule Take by mouth daily.  Marland Kitchen azithromycin (ZITHROMAX Z-PAK) 250 MG tablet 2 po day one, then 1 daily x 4 days  . meloxicam (MOBIC) 15 MG tablet Take 15 mg by mouth daily.  Marland Kitchen Phenylephrine-DM-GG-APAP (TYLENOL COLD/FLU SEVERE PO) Take 2 tablets by mouth 2 (two) times daily.  Marland Kitchen spironolactone-hydrochlorothiazide (ALDACTAZIDE) 25-25 MG tablet Take 1 tablet by mouth daily.     Allergies:   Patient has no known allergies.   Social History   Socioeconomic History  . Marital status: Single    Spouse name: Not on file  . Number of children: Not on file  . Years of education: Not on file  . Highest education level: Not on file  Occupational History  . Not on file  Social Needs  . Financial resource strain: Not on file  . Food insecurity:    Worry: Not on file    Inability: Not on file  . Transportation needs:    Medical: Not on file    Non-medical: Not on file  Tobacco Use  . Smoking status: Former Smoker    Packs/day: 0.25    Types: Cigarettes  . Smokeless tobacco: Never Used  Substance and Sexual Activity  . Alcohol use: Yes    Comment: weekends   . Drug use: Not Currently    Types: Marijuana    Comment: occasionally  . Sexual activity: Not on file  Lifestyle  . Physical activity:    Days per week: Not on file    Minutes per session: Not on file  . Stress: Not on file  Relationships  . Social connections:    Talks on phone: Not on file    Gets together: Not on file    Attends religious service: Not on file    Active member of club or organization: Not on file    Attends meetings of clubs or organizations: Not on file    Relationship status: Not on file  Other Topics Concern  . Not on file  Social History  Narrative  . Not on file     Family History: The patient's family history includes Cancer in his father and mother. ROS:   Please see the history of present illness.    All other systems reviewed and are negative.  EKGs/Labs/Other Studies Reviewed:    The following studies were reviewed today:  EKG:  EKG ordered today.  The ekg ordered today demonstrates [facility] LVH repolarization  Recent Labs: 04/11/2017: ALT 20 04/30/2017: BUN 18; Creatinine, Ser 1.04; Hemoglobin 15.0; Platelets 258; Potassium 4.4; Sodium 134  Recent Lipid Panel No results found for: CHOL, TRIG, HDL, CHOLHDL, VLDL, LDLCALC, LDLDIRECT  Physical Exam:    VS:  BP (!) 154/70   Pulse 100   Resp 12  Ht 5\' 5"  (1.651 m)   Wt 231 lb 1.9 oz (104.8 kg)   BMI 38.46 kg/m     Wt Readings from Last 3 Encounters:  05/02/17 231 lb 1.9 oz (104.8 kg)  05/01/17 239 lb (108.4 kg)  04/16/17 239 lb (108.4 kg)     GEN:  Well nourished, well developed in no acute distress HEENT: Normal NECK: No JVD; No carotid bruits LYMPHATICS: No lymphadenopathy CARDIAC: 1/6 SEM aortic areaRRR, no murmurs, rubs, gallops RESPIRATORY:  Clear to auscultation without rales, wheezing or rhonchi  ABDOMEN: Soft, non-tender, non-distended MUSCULOSKELETAL:  No edema; No deformity  SKIN: Warm and dry NEUROLOGIC:  Alert and oriented x 3 PSYCHIATRIC:  Normal affect    Signed, Norman Herrlich, MD  05/02/2017 4:47 PM    [facility]

## 2017-05-03 ENCOUNTER — Other Ambulatory Visit: Payer: Self-pay

## 2017-05-03 ENCOUNTER — Telehealth: Payer: Self-pay

## 2017-05-03 ENCOUNTER — Ambulatory Visit (HOSPITAL_COMMUNITY): Admission: RE | Admit: 2017-05-03 | Payer: 59 | Source: Ambulatory Visit | Admitting: Cardiology

## 2017-05-03 ENCOUNTER — Inpatient Hospital Stay (HOSPITAL_COMMUNITY): Payer: 59

## 2017-05-03 ENCOUNTER — Ambulatory Visit (HOSPITAL_COMMUNITY)
Admission: EM | Disposition: A | Discharge status: Home / Self Care | Payer: Self-pay | Source: Home / Self Care | Attending: Emergency Medicine

## 2017-05-03 ENCOUNTER — Encounter (HOSPITAL_COMMUNITY): Payer: Self-pay | Admitting: *Deleted

## 2017-05-03 DIAGNOSIS — R9439 Abnormal result of other cardiovascular function study: Secondary | ICD-10-CM | POA: Diagnosis not present

## 2017-05-03 DIAGNOSIS — Z6837 Body mass index (BMI) 37.0-37.9, adult: Secondary | ICD-10-CM | POA: Diagnosis not present

## 2017-05-03 DIAGNOSIS — R9431 Abnormal electrocardiogram [ECG] [EKG]: Secondary | ICD-10-CM | POA: Diagnosis not present

## 2017-05-03 DIAGNOSIS — R072 Precordial pain: Secondary | ICD-10-CM

## 2017-05-03 DIAGNOSIS — E669 Obesity, unspecified: Secondary | ICD-10-CM | POA: Diagnosis not present

## 2017-05-03 DIAGNOSIS — I249 Acute ischemic heart disease, unspecified: Secondary | ICD-10-CM

## 2017-05-03 DIAGNOSIS — I200 Unstable angina: Secondary | ICD-10-CM | POA: Insufficient documentation

## 2017-05-03 DIAGNOSIS — R948 Abnormal results of function studies of other organs and systems: Secondary | ICD-10-CM | POA: Diagnosis not present

## 2017-05-03 DIAGNOSIS — R079 Chest pain, unspecified: Secondary | ICD-10-CM | POA: Diagnosis not present

## 2017-05-03 DIAGNOSIS — R0683 Snoring: Secondary | ICD-10-CM | POA: Diagnosis not present

## 2017-05-03 DIAGNOSIS — I10 Essential (primary) hypertension: Secondary | ICD-10-CM | POA: Diagnosis not present

## 2017-05-03 DIAGNOSIS — F17210 Nicotine dependence, cigarettes, uncomplicated: Secondary | ICD-10-CM | POA: Diagnosis not present

## 2017-05-03 HISTORY — PX: LEFT HEART CATH AND CORONARY ANGIOGRAPHY: CATH118249

## 2017-05-03 LAB — PROTIME-INR
INR: 1.01
PROTHROMBIN TIME: 13.2 s (ref 11.4–15.2)

## 2017-05-03 LAB — COMPREHENSIVE METABOLIC PANEL
ALBUMIN: 4.4 g/dL (ref 3.5–5.0)
ALK PHOS: 62 U/L (ref 38–126)
ALT: 32 U/L (ref 17–63)
AST: 23 U/L (ref 15–41)
Anion gap: 12 (ref 5–15)
BILIRUBIN TOTAL: 0.4 mg/dL (ref 0.3–1.2)
BUN: 15 mg/dL (ref 6–20)
CO2: 23 mmol/L (ref 22–32)
CREATININE: 1.1 mg/dL (ref 0.61–1.24)
Calcium: 9.1 mg/dL (ref 8.9–10.3)
Chloride: 100 mmol/L — ABNORMAL LOW (ref 101–111)
GFR calc Af Amer: >60 mL/min (ref >60)
GFR calc non Af Amer: >60 mL/min (ref >60)
GLUCOSE: 108 mg/dL — AB (ref 65–99)
POTASSIUM: 3.8 mmol/L (ref 3.5–5.1)
Sodium: 135 mmol/L (ref 135–145)
TOTAL PROTEIN: 7.4 g/dL (ref 6.5–8.1)

## 2017-05-03 LAB — CBC
HEMATOCRIT: 43.1 % (ref 39.0–52.0)
Hemoglobin: 14.2 g/dL (ref 13.0–17.0)
MCH: 25.7 pg — AB (ref 26.0–34.0)
MCHC: 32.9 g/dL (ref 30.0–36.0)
MCV: 77.9 fL — AB (ref 78.0–100.0)
PLATELETS: 247 10*3/uL (ref 150–400)
RBC: 5.53 MIL/uL (ref 4.22–5.81)
RDW: 14.2 % (ref 11.5–15.5)
WBC: 8.8 10*3/uL (ref 4.0–10.5)

## 2017-05-03 LAB — TROPONIN I
Troponin I: <0.03 ng/mL (ref <0.03)
Troponin I: <0.03 ng/mL (ref <0.03)

## 2017-05-03 LAB — HIV ANTIBODY (ROUTINE TESTING W REFLEX): HIV Screen 4th Generation wRfx: NONREACTIVE

## 2017-05-03 LAB — HEPARIN LEVEL (UNFRACTIONATED)
HEPARIN UNFRACTIONATED: 0.45 [IU]/mL (ref 0.30–0.70)
Heparin Unfractionated: 0.33 IU/mL (ref 0.30–0.70)

## 2017-05-03 LAB — MAGNESIUM: MAGNESIUM: 2.2 mg/dL (ref 1.7–2.4)

## 2017-05-03 LAB — BRAIN NATRIURETIC PEPTIDE: B NATRIURETIC PEPTIDE 5: 36 pg/mL (ref 0.0–100.0)

## 2017-05-03 LAB — LIPID PANEL
Cholesterol: 184 mg/dL (ref 0–200)
HDL: 47 mg/dL (ref >40)
LDL Cholesterol: 84 mg/dL (ref 0–99)
Total CHOL/HDL Ratio: 3.9 RATIO
Triglycerides: 267 mg/dL — ABNORMAL HIGH (ref <150)
VLDL: 53 mg/dL — AB (ref 0–40)

## 2017-05-03 LAB — MRSA PCR SCREENING: MRSA BY PCR: NEGATIVE

## 2017-05-03 LAB — ECHOCARDIOGRAM COMPLETE
HEIGHTINCHES: 65 in
WEIGHTICAEL: 3650.82 [oz_av]

## 2017-05-03 LAB — HEMOGLOBIN A1C
Hgb A1c MFr Bld: 5.8 % — ABNORMAL HIGH (ref 4.8–5.6)
Mean Plasma Glucose: 119.76 mg/dL

## 2017-05-03 LAB — TSH: TSH: 3.742 u[IU]/mL (ref 0.350–4.500)

## 2017-05-03 SURGERY — LEFT HEART CATH AND CORONARY ANGIOGRAPHY
Anesthesia: LOCAL

## 2017-05-03 MED ORDER — HEPARIN BOLUS VIA INFUSION
4000.0000 [IU] | Freq: Once | INTRAVENOUS | Status: DC
  Filled 2017-05-03: qty 4000

## 2017-05-03 MED ORDER — METOPROLOL TARTRATE 25 MG PO TABS
50.0000 mg | ORAL_TABLET | Freq: Two times a day (BID) | ORAL | Status: DC
  Filled 2017-05-03: qty 1

## 2017-05-03 MED ORDER — SODIUM CHLORIDE 0.9% FLUSH: 3.0000 mL | Freq: Two times a day (BID) | INTRAVENOUS | Status: DC

## 2017-05-03 MED ORDER — ASPIRIN EC 81 MG PO TBEC: 81.0000 mg | DELAYED_RELEASE_TABLET | Freq: Every day | ORAL | Status: DC

## 2017-05-03 MED ORDER — MIDAZOLAM HCL 2 MG/2ML IJ SOLN
INTRAMUSCULAR | Status: DC | PRN
  Administered 2017-05-03: 1 mg via INTRAVENOUS

## 2017-05-03 MED ORDER — ONDANSETRON HCL 4 MG/2ML IJ SOLN: 4.0000 mg | Freq: Four times a day (QID) | INTRAMUSCULAR | Status: DC | PRN

## 2017-05-03 MED ORDER — SODIUM CHLORIDE 0.9 % IV SOLN: 250.0000 mL | INTRAVENOUS | Status: DC | PRN

## 2017-05-03 MED ORDER — ASPIRIN 81 MG PO CHEW: 81.0000 mg | CHEWABLE_TABLET | ORAL | Status: DC

## 2017-05-03 MED ORDER — SODIUM CHLORIDE 0.9 % WEIGHT BASED INFUSION: 1.0000 mL/kg/h | INTRAVENOUS | Status: DC

## 2017-05-03 MED ORDER — METOPROLOL TARTRATE 50 MG PO TABS: 50.0000 mg | ORAL_TABLET | Freq: Two times a day (BID) | ORAL | 6 refills | Status: DC

## 2017-05-03 MED ORDER — METOPROLOL TARTRATE 25 MG PO TABS
25.0000 mg | ORAL_TABLET | Freq: Two times a day (BID) | ORAL | Status: DC
  Administered 2017-05-03 (×2): 25 mg via ORAL
  Filled 2017-05-03 (×2): qty 1

## 2017-05-03 MED ORDER — HEPARIN (PORCINE) IN NACL 2-0.9 UNIT/ML-% IJ SOLN
INTRAMUSCULAR | Status: AC | PRN
  Administered 2017-05-03 (×2): 500 mL

## 2017-05-03 MED ORDER — VERAPAMIL HCL 2.5 MG/ML IV SOLN
INTRAVENOUS | Status: AC
  Filled 2017-05-03: qty 2

## 2017-05-03 MED ORDER — ASPIRIN 300 MG RE SUPP: 300.0000 mg | RECTAL | Status: AC

## 2017-05-03 MED ORDER — ATORVASTATIN CALCIUM 80 MG PO TABS: 80.0000 mg | ORAL_TABLET | Freq: Every day | ORAL | Status: DC

## 2017-05-03 MED ORDER — FENTANYL CITRATE (PF) 100 MCG/2ML IJ SOLN
INTRAMUSCULAR | Status: AC
  Filled 2017-05-03: qty 2

## 2017-05-03 MED ORDER — HEPARIN SODIUM (PORCINE) 1000 UNIT/ML IJ SOLN
INTRAMUSCULAR | Status: DC | PRN
  Administered 2017-05-03: 5000 [IU] via INTRAVENOUS

## 2017-05-03 MED ORDER — NITROGLYCERIN IN D5W 200-5 MCG/ML-% IV SOLN
5.0000 ug/min | INTRAVENOUS | Status: DC
  Administered 2017-05-03: 5 ug/min via INTRAVENOUS
  Filled 2017-05-03: qty 250

## 2017-05-03 MED ORDER — SODIUM CHLORIDE 0.9 % WEIGHT BASED INFUSION
3.0000 mL/kg/h | INTRAVENOUS | Status: AC
  Administered 2017-05-03: 3 mL/kg/h via INTRAVENOUS

## 2017-05-03 MED ORDER — NITROGLYCERIN 0.4 MG SL SUBL: 0.4000 mg | SUBLINGUAL_TABLET | SUBLINGUAL | Status: DC | PRN

## 2017-05-03 MED ORDER — SODIUM CHLORIDE 0.9% FLUSH: 3.0000 mL | INTRAVENOUS | Status: DC | PRN

## 2017-05-03 MED ORDER — IOPAMIDOL (ISOVUE-370) INJECTION 76%
INTRAVENOUS | Status: DC | PRN
  Administered 2017-05-03: 80 mL via INTRA_ARTERIAL

## 2017-05-03 MED ORDER — ASPIRIN 81 MG PO CHEW: 324.0000 mg | CHEWABLE_TABLET | ORAL | Status: AC

## 2017-05-03 MED ORDER — PANTOPRAZOLE SODIUM 40 MG PO TBEC
40.0000 mg | DELAYED_RELEASE_TABLET | Freq: Every day | ORAL | Status: DC
  Administered 2017-05-03: 40 mg via ORAL
  Filled 2017-05-03: qty 1

## 2017-05-03 MED ORDER — ACETAMINOPHEN 325 MG PO TABS: 650.0000 mg | ORAL_TABLET | ORAL | Status: DC | PRN

## 2017-05-03 MED ORDER — SODIUM CHLORIDE 0.9 % WEIGHT BASED INFUSION
1.0000 mL/kg/h | INTRAVENOUS | Status: DC
  Administered 2017-05-03: 1 mL/kg/h via INTRAVENOUS

## 2017-05-03 MED ORDER — LIDOCAINE HCL (PF) 1 % IJ SOLN
INTRAMUSCULAR | Status: DC | PRN
  Administered 2017-05-03: 2 mL via INTRADERMAL

## 2017-05-03 MED ORDER — SODIUM CHLORIDE 0.9 % IV SOLN: INTRAVENOUS | Status: AC

## 2017-05-03 MED ORDER — HEPARIN SODIUM (PORCINE) 1000 UNIT/ML IJ SOLN
INTRAMUSCULAR | Status: AC
  Filled 2017-05-03: qty 1

## 2017-05-03 MED ORDER — FENTANYL CITRATE (PF) 100 MCG/2ML IJ SOLN
INTRAMUSCULAR | Status: DC | PRN
  Administered 2017-05-03: 25 ug via INTRAVENOUS

## 2017-05-03 MED ORDER — SODIUM CHLORIDE 0.9 % WEIGHT BASED INFUSION: 3.0000 mL/kg/h | INTRAVENOUS | Status: DC

## 2017-05-03 MED ORDER — SODIUM CHLORIDE 0.9% FLUSH
3.0000 mL | Freq: Two times a day (BID) | INTRAVENOUS | Status: DC
  Administered 2017-05-03 (×2): 3 mL via INTRAVENOUS

## 2017-05-03 MED ORDER — HEPARIN (PORCINE) IN NACL 100-0.45 UNIT/ML-% IJ SOLN: 1000.0000 [IU]/h | INTRAMUSCULAR | Status: DC

## 2017-05-03 MED ORDER — MIDAZOLAM HCL 2 MG/2ML IJ SOLN
INTRAMUSCULAR | Status: AC
  Filled 2017-05-03: qty 2

## 2017-05-03 MED ORDER — ASPIRIN 81 MG PO CHEW
81.0000 mg | CHEWABLE_TABLET | ORAL | Status: AC
  Administered 2017-05-03: 06:00:00 81 mg via ORAL
  Filled 2017-05-03: qty 1

## 2017-05-03 MED ORDER — VERAPAMIL HCL 2.5 MG/ML IV SOLN
INTRAVENOUS | Status: DC | PRN
  Administered 2017-05-03: 10 mL via INTRA_ARTERIAL

## 2017-05-03 MED ORDER — HEPARIN (PORCINE) IN NACL 2-0.9 UNIT/ML-% IJ SOLN
INTRAMUSCULAR | Status: AC
  Filled 2017-05-03: qty 1000

## 2017-05-03 SURGICAL SUPPLY — 11 items
BAND CMPR LRG ZPHR (HEMOSTASIS) ×1
BAND ZEPHYR COMPRESS 30 LONG (HEMOSTASIS) ×1 IMPLANT
CATH IMPULSE 5F ANG/FL3.5 (CATHETERS) ×1 IMPLANT
GLIDESHEATH SLEND A-KIT 6F 22G (SHEATH) ×2 IMPLANT
GUIDEWIRE INQWIRE 1.5J.035X260 (WIRE) IMPLANT
INQWIRE 1.5J .035X260CM (WIRE) ×2
KIT HEART LEFT (KITS) ×2 IMPLANT
PACK CARDIAC CATHETERIZATION (CUSTOM PROCEDURE TRAY) ×2 IMPLANT
SYR MEDRAD MARK V 150ML (SYRINGE) ×2 IMPLANT
TRANSDUCER W/STOPCOCK (MISCELLANEOUS) ×2 IMPLANT
TUBING CIL FLEX 10 FLL-RA (TUBING) ×2 IMPLANT

## 2017-05-03 NOTE — Discharge Summary (Addendum)
Discharge Summary    Patient ID: Luke Wade,  MRN: [id number], DOB/AGE: 07/31/1980 37 y.o.  Admit date: 05/02/2017 Discharge date: 05/03/2017  Primary Care Provider: Patient, No Pcp Per Primary Cardiologist: Norman Herrlich, MD  Discharge Diagnoses    Principal Problem:   Abnormal myocardial perfusion study Active Problems:   Chest pain   Hypertension   Cigarette smoker   Overweight   Allergies No Known Allergies  Diagnostic Studies/Procedures    Left heart cath 05/03/17:  Angiographically normal coronary arteries  There is hyperdynamic left ventricular systolic function -with small apical blebbing and distal hypokinesis leading to cavity obliteration.  The left ventricular ejection fraction is greater than 65% by visual estimate.  LV end diastolic pressure is mildly elevated.  There is no aortic valve stenosis.   False positive nuclear stress test with clearly normal EF and no evidence of any significant disease to explain inferior defect noted on Myoview.  I suspect the abnormal EKG is related to LVH or vision changes. Suspect chest discomfort is related to potentially hypertensive hypertrophic cardiomyopathy with increased wall strain --left ventricle appears to be hyperdynamic with near cavity obliteration in the apex with a small apical ballooning.    Plan: He can potentially be discharged later on today after bed rest. I have increased his beta-blocker dose -- Metoprolol 50 mg twice daily.   Echo 05/03/17: Study Conclusions - Left ventricle: The cavity size was normal. Wall thickness was   increased in a pattern of moderate LVH. Systolic function was   vigorous. The estimated ejection fraction was in the range of 65%   to 70%. Wall motion was normal; there were no regional wall   motion abnormalities. Doppler parameters are consistent with   abnormal left ventricular relaxation (grade 1 diastolic   dysfunction). The E/e&' ratio is >15, suggesting  elevated LV   filling pressure. - Aortic valve: Poorly visualized. - Left atrium: The atrium was normal in size. - Inferior vena cava: The vessel was normal in size. The   respirophasic diameter changes were in the normal range (>= 50%),   consistent with normal central venous pressure. - Pericardium, extracardiac: A trivial pericardial effusion was   identified posterior to the heart. Features were not consistent   with tamponade physiology.  Impressions: - Compared to a prior study 2 weeks ago, the only change noted is a trivial posterior pericardial effusion.   History of Present Illness     Luke Wade is a 37 y.o. male with a history of   Of  Chest pain . He is brought to the office today because of an abnormal nuclear stress test. He has had 2 emergency room visits with chest pain markedly abnormal EKG and tells me that last night he had severe chest pain waxing and waning through the night. He has episodes of a sharp chest pain substernal to the right of the sternum radiates through to his back and last for hours at a time it occurs typically at night unrelieved with rest but he also has exertional shortness of breath and at times he gets the sensation when he walks faster or longer distance. His background history of hypertension and LVH she has a soft systolic ejection murmur and may have a hypertrophic cardiomyopathy but with multiple ED visits abnormal EKG and severe symptoms last night I feel is best that he be assessed for acute coronary syndrome. He was admitted for heart catheterization tomorrow. I called the card master  and advice was to send him to the emergency room at med center [facility]. Presently having no chest pain no shortness of breath. He has normal renal function no dye allergy no history of congenital rheumatic heart disease. His risk factors are noteworthy for obesity hypertension cigarette smoking.  Hospital Course     Consultants: none  Pt was  admitted with plans for repeat echo and left heart cath. LHC performed and showed angiographically normal coronaries. Echo with normal EF. He had a false positive stress test.   He tolerated the procedure well. TR band removed from right radial cath site. Pulses intact.   He quit smoking 3 weeks ago. Encouraged cessation. Instructed him to walk 30 min daily, follow a low salt diet, and to take his BP daily. Will arrange cardiology follow up.  He works in [employer] at [employer]. Given his work requirements, I provided a note to be out of work for 7 days.  _____________  Discharge Vitals Blood pressure (!) 142/72, pulse 75, temperature 98.6 F (37 C), temperature source Oral, resp. rate 14, height 5\' 5"  (1.651 m), weight 228 lb 2.8 oz (103.5 kg), SpO2 100 %.  Filed Weights   05/02/17 1706 05/03/17 0008 05/03/17 0629  Weight: 231 lb (104.8 kg) 228 lb 2.8 oz (103.5 kg) 228 lb 2.8 oz (103.5 kg)    Physical Exam  Constitutional: He appears healthy. No distress.  Neck: Normal range of motion. No JVD present.  Cardiovascular: Normal rate, regular rhythm, S1 normal, S2 normal and intact distal pulses.  Pulmonary/Chest: Effort normal and breath sounds normal.  Abdominal: Soft. Bowel sounds are normal.  Musculoskeletal: He exhibits no edema.  Neurological: He is alert and oriented to person, place, and time.  Skin: Skin is warm and dry.    Labs & Radiologic Studies    CBC Recent Labs    05/02/17 1724 05/03/17 0701  WBC 9.3 8.8  HGB 15.4 14.2  HCT 44.8 43.1  MCV 74.8* 77.9*  PLT 275 247   Basic Metabolic Panel Recent Labs    16/10/96 1724 05/03/17 0104  NA 135 135  K 4.2 3.8  CL 101 100*  CO2 23 23  GLUCOSE 102* 108*  BUN 13 15  CREATININE 1.03 1.10  CALCIUM 9.2 9.1  MG  --  2.2   Liver Function Tests Recent Labs    05/03/17 0104  AST 23  ALT 32  ALKPHOS 62  BILITOT 0.4  PROT 7.4  ALBUMIN 4.4   No results for input(s): LIPASE, AMYLASE in the last 72  hours. Cardiac Enzymes Recent Labs    05/03/17 0104 05/03/17 0701 05/03/17 1232  TROPONINI <0.03 <0.03 <0.03   BNP Invalid input(s): POCBNP D-Dimer No results for input(s): DDIMER in the last 72 hours. Hemoglobin A1C Recent Labs    05/03/17 0104  HGBA1C 5.8*   Fasting Lipid Panel Recent Labs    05/03/17 0701  CHOL 184  HDL 47  LDLCALC 84  TRIG 267*  CHOLHDL 3.9   Thyroid Function Tests Recent Labs    05/03/17 0104  TSH 3.742   _____________  Dg Chest 2 View  Result Date: 05/02/2017 CLINICAL DATA:  Chest pain. EXAM: CHEST - 2 VIEW COMPARISON:  Radiographs of April 30, 2017. FINDINGS: The heart size and mediastinal contours are within normal limits. Both lungs are clear. No pneumothorax or pleural effusion is noted. The visualized skeletal structures are unremarkable. IMPRESSION: No active cardiopulmonary disease. Electronically Signed   By: Fayrene Fearing  Christen Butter, M.D.   On: 05/02/2017 17:25   Dg Chest 2 View  Result Date: 04/30/2017 CLINICAL DATA:  Onset of dizziness and lightheadedness today at work with upper right-sided sharp chest pain occasionally over the past month. Began a new blood pressure medicine last week. EXAM: CHEST - 2 VIEW COMPARISON:  PA and lateral chest x-ray of April 11, 2017 FINDINGS: The lungs are adequately inflated and clear. The heart and pulmonary vascularity are normal. There is no pleural effusion or pneumothorax. The bony thorax is unremarkable. IMPRESSION: There is no active cardiopulmonary disease. Electronically Signed   By: David  [place] M.D.   On: 04/30/2017 10:52   Dg Chest 2 View  Result Date: 04/11/2017 CLINICAL DATA:  Hypertension.  Abnormal EKG today. EXAM: CHEST - 2 VIEW COMPARISON:  PA and lateral chest 01/25/2016. FINDINGS: There is cardiomegaly without edema. No pneumothorax or pleural effusion. No focal bony abnormality. IMPRESSION: Cardiomegaly without acute disease. Electronically Signed   By: Drusilla Kanner M.D.   On: 04/11/2017  16:09   Disposition   Pt is being discharged home today in good condition.  Follow-up Plans & Appointments    Follow-up Information    Baldo Daub, MD Follow up on 05/17/2017.   Specialty:  Cardiology Why:  8:20am  Contact information: [address] [place] [address] [phone]          Discharge Instructions    Diet - low sodium heart healthy   Complete by:  As directed    Discharge instructions   Complete by:  As directed    Wound Care: You may wash cath site gently with soap and water. Keep cath site clean and dry. If you notice pain, swelling, bleeding or pus at your cath site, please call [phone].  No driving for 2 days. No lifting over 5 lbs for 1 week. No sexual activity for 1 week. You may return to work in 1 week. Keep procedure site clean & dry. If you notice increased pain, swelling, bleeding or pus, call/return!  You may shower, but no soaking baths/hot tubs/pools for 1 week.   Increase activity slowly   Complete by:  As directed       Discharge Medications   Allergies as of 05/03/2017   No Known Allergies     Medication List    TAKE these medications   esomeprazole 20 MG capsule Commonly known as:  NEXIUM Take 20 mg by mouth daily at 12 noon.   metoprolol tartrate 50 MG tablet Commonly known as:  LOPRESSOR Take 1 tablet (50 mg total) by mouth 2 (two) times daily.   spironolactone-hydrochlorothiazide 25-25 MG tablet Commonly known as:  ALDACTAZIDE Take 1 tablet by mouth daily.         Outstanding Labs/Studies   none  Duration of Discharge Encounter   Greater than 30 minutes including physician time.  Signed, Roe Rutherford Duke NP 05/03/2017, 2:00 PM   Attending Note:   The patient was seen and examined.  Agree with assessment and plan as noted above.  Changes made to the above note as needed.  Patient seen and independently examined with  Bettina Gavia , PA .   We discussed all aspects of the encounter.  I agree with the assessment and plan as stated above.  1.  Chest discomfort: The patient presented with some episodes of chest pain.  He had an abnormal Myoview study.  His heart catheterization today revealed no significant coronary artery irregularities.  He did have hyper dynamic left ventricular systolic function.  We have started him on metoprolol.  We will be stable to be discharged today.  He will follow-up with Dr. Dulce SellarMunley in the office.   I have spent a total of 40 minutes with patient reviewing hospital  notes , telemetry, EKGs, labs and examining patient as well as establishing an assessment and plan that was discussed with the patient. > 50% of time was spent in direct patient care.    Vesta MixerPhilip J. Nahser, Montez HagemanJr., MD, [facility] RecoveryFACC 05/03/2017, 5:15 PM [address],  [address] Office [phone] Pager [phone]

## 2017-05-03 NOTE — Progress Notes (Signed)
    I have discussed cath and possible PCI with patient and his wife. I have personally reviewed myoview images We discussed risks, benefits, options.  He understands and agrees to proceed.     Kristeen MissPhilip Bertrand Vowels, MD  05/03/2017 8:30 AM    [facility] [address],  [address] [place], [place]  [id number] Pager [phone] Phone: [phone]; Fax: [phone]

## 2017-05-03 NOTE — Care Management (Signed)
Pt does not have Medicare product for Insurance- pt will not be a CC 44. Gala LewandowskyGraves-Bigelow, Sarena Jezek Kaye, RN, BSN [phone]

## 2017-05-03 NOTE — Telephone Encounter (Signed)
Transition of care call made to patient post left heart catheterization. Patient stated that he was feeling well and felt confident with the post cath restrictions and education given. Informed patient of his appointment date and times with cardiology. Reiterated the medication change regarding his beta blocker; patient was understanding. No further questions or concerns.

## 2017-05-03 NOTE — Interval H&P Note (Signed)
History and Physical Interval Note:  05/03/2017 9:25 AM  Luke Wade  has presented today for surgery, with the diagnosis of Unstable Angina (progressive) with Abn stress test.    The various methods of treatment have been discussed with the patient and family. After consideration of risks, benefits and other options for treatment, the patient has consented to  Procedure(s): LEFT HEART CATH AND CORONARY ANGIOGRAPHY (N/A) with possible PERCUTANEOUS CORONARY INTERVENTION as a surgical intervention .  The patient's history has been reviewed, patient examined, no change in status, stable for surgery.  I have reviewed the patient's chart and labs.  Questions were answered to the patient's satisfaction.     Cath Lab Visit (complete for each Cath Lab visit)  Clinical Evaluation Leading to the Procedure:   ACS: Yes.    Non-ACS:    Anginal Classification: CCS III  Anti-ischemic medical therapy: Minimal Therapy (1 class of medications)  Non-Invasive Test Results: Intermediate-risk stress test findings: cardiac mortality 1-3%/year  Prior CABG: No previous CABG   Bryan Lemmaavid Marcos Ruelas

## 2017-05-03 NOTE — Progress Notes (Signed)
ANTICOAGULATION CONSULT NOTE - Follow Up Consult  Pharmacy Consult for heparin Indication: chest pain/ACS  Labs: Recent Labs    04/30/17 0929 05/02/17 1724 05/02/17 2012 05/03/17 0104 05/03/17 0701  HGB 15.0 15.4  --   --  14.2  HCT 45.0 44.8  --   --  43.1  PLT 258 275  --   --  247  LABPROT  --   --   --  13.2  --   INR  --   --   --  1.01  --   HEPARINUNFRC  --   --   --  0.45 0.33  CREATININE 1.04 1.03  --  1.10  --   TROPONINI  --  0.03* 0.03* <0.03  --     Assessment/Plan:  37yo male therapeutic on heparin x 2 Continue heparin drip at 1000 units / hr Follow up after cath  Thank you Okey RegalLisa Eugenie Harewood, PharmD [phone]  05/03/2017,8:09 AM

## 2017-05-03 NOTE — Progress Notes (Signed)
Off floor to cath lab.

## 2017-05-03 NOTE — Progress Notes (Signed)
ANTICOAGULATION CONSULT NOTE - Follow Up Consult  Pharmacy Consult for heparin Indication: chest pain/ACS  Labs: Recent Labs    04/30/17 0929 05/02/17 1724 05/02/17 2012 05/03/17 0104  HGB 15.0 15.4  --   --   HCT 45.0 44.8  --   --   PLT 258 275  --   --   LABPROT  --   --   --  13.2  INR  --   --   --  1.01  HEPARINUNFRC  --   --   --  0.45  CREATININE 1.04 1.03  --   --   TROPONINI  --  0.03* 0.03*  --     Assessment/Plan:  37yo male therapeutic on heparin with initial dosing for CP. Will continue gtt at current rate and confirm stable with additional level.   Vernard GamblesVeronda Costas Sena, PharmD, BCPS  05/03/2017,1:45 AM

## 2017-05-03 NOTE — Telephone Encounter (Signed)
-----   Message from Lisa B Welch sent at 05/03/2017  1:59 PM EDT -Minette Brine---- Regarding: TCM dc [facility]  Please call patient for TCM [facility] 05/03/2017. Patient scheduled for follow up 05/17/2017.

## 2017-05-03 NOTE — H&P (Signed)
Cardiology Admission History and Physical:   Patient ID: Luke Wade; MRN: [id number]; DOB: 27-May-1980   Admission date: 05/02/2017  Primary Care Provider: Patient, No Pcp Per Primary Cardiologist: Dr Jonna CoupMunley/[place]. Primary Electrophysiologist:  None.  Chief Complaint:  Chest pain, abnl  nuc  Study  Result.   Patient Profile:   Luke Wade is a 37 y.o. male with a history of   Of  Chest pain . He is brought to the office today because of an abnormal nuclear stress test.  He has had 2 emergency room visits with chest pain markedly abnormal EKG and tells me that last night he had severe chest pain waxing and waning through the night.  He has episodes of a sharp chest pain substernal to the right of the sternum radiates through to his back and last for hours at a time it occurs typically at night unrelieved with rest but he also has exertional shortness of breath and at times he gets the sensation when he walks faster or longer distance.  His background history of hypertension and LVH she has a soft systolic ejection murmur and may have a hypertrophic cardiomyopathy but with multiple ED visits abnormal EKG and severe symptoms last night I feel is best that he be assessed for acute coronary syndrome admitted heart catheterization tomorrow.  I called the card master and advice was to send [facility].  Presently having no chest pain no shortness of breath.  He has normal renal function no dye allergy no history of congenital rheumatic heart disease.  His risk factors are noteworthy for obesity hypertension cigarette smoking.   History of Present Illness:  Luke Wade is a 37 y.o. male with a hx of chest pain and abnormal EKG with marked LVH and repolarization changes with 2 ED visits last seen by Dr Tomie Chinaevankar 04/16/17. His MPI was markedly abnormal and contacted to be seen by me in office.  Study Highlights    Nuclear stress EF: 31%.  There  was no ST segment deviation noted during stress.  Defect 1: There is a medium defect of moderate severity present in the basal inferolateral, mid inferolateral and apical inferior location.  Findings consistent with ischemia.  This is an intermediate risk study.  The left ventricular ejection fraction is moderately decreased (30-44%). There is a medium size area mild severity reversible defect in the basal and mid inferolateral walls consistent with ischemia. LVEF is moderately decreased.   Echo 04/19/17 Study Conclusions - Left ventricle: The cavity size was normal. Wall thickness was increased in a pattern of severe LVH. Systolic function was vigorous. The estimated ejection fraction was in the range of 65% to 70%. Doppler parameters are consistent with abnormal left ventricular relaxation (grade 1 diastolic dysfunction)  Luke Wade He is brought to the office today because of an abnormal nuclear stress test.  He has had 2 emergency room visits with chest pain markedly abnormal EKG and tells me that last night he had severe chest pain waxing and waning through the night.  He has episodes of a sharp chest pain substernal to the right of the sternum radiates through to his back and last for hours at a time it occurs typically at night unrelieved with rest but he also has exertional shortness of breath and at times he gets the sensation when he walks faster or longer distance.  His background history of hypertension and LVH she has a soft  systolic ejection murmur and may have a hypertrophic cardiomyopathy but with multiple ED visits abnormal EKG and severe symptoms last night It  Was   best that he be assessed for acute coronary syndrome admitted heart catheterization tomorrow.   Past Medical History:  Diagnosis Date  . Abnormal myocardial perfusion study 05/02/2017  . Chest pain 04/16/2017  . Cigarette smoker 04/16/2017  . Hypertension 04/16/2017  . Overweight 04/16/2017    Past  Surgical History:  Procedure Laterality Date  . NO PAST SURGERIES       Medications Prior to Admission: Prior to Admission medications   Medication Sig Start Date End Date Taking? Authorizing Provider  Aspirin-Caffeine (BAYER BACK & BODY) 500-32.5 MG TABS Take 2 tablets by mouth daily.    [provider]  esomeprazole (NEXIUM) 20 MG capsule Take 20 mg by mouth daily at 12 noon.    [provider]  spironolactone-hydrochlorothiazide (ALDACTAZIDE) 25-25 MG tablet Take 1 tablet by mouth daily. 04/16/17   Revankar, Aundra Dubin, MD     Allergies:   No Known Allergies  Social History:   Social History   Socioeconomic History  . Marital status: Married    Spouse name: Not on file  . Number of children: Not on file  . Years of education: Not on file  . Highest education level: Not on file  Occupational History  . Not on file  Social Needs  . Financial resource strain: Not on file  . Food insecurity:    Worry: Not on file    Inability: Not on file  . Transportation needs:    Medical: Not on file    Non-medical: Not on file  Tobacco Use  . Smoking status: Former Smoker    Packs/day: 0.25    Types: Cigarettes  . Smokeless tobacco: Never Used  Substance and Sexual Activity  . Alcohol use: Yes    Comment: weekends   . Drug use: Not Currently    Types: Marijuana    Comment: occasionally  . Sexual activity: Not on file  Lifestyle  . Physical activity:    Days per week: Not on file    Minutes per session: Not on file  . Stress: Not on file  Relationships  . Social connections:    Talks on phone: Not on file    Gets together: Not on file    Attends religious service: Not on file    Active member of club or organization: Not on file    Attends meetings of clubs or organizations: Not on file    Relationship status: Not on file  . Intimate partner violence:    Fear of current or ex partner: Not on file    Emotionally abused: Not on file    Physically abused: Not  on file    Forced sexual activity: Not on file  Other Topics Concern  . Not on file  Social History Narrative  . Not on file    Family History:   The patient's family history includes Cancer in his father and mother.    ROS:  Please see the history of present illness.  All other ROS reviewed and negative.   Except  For  Chest pain   Physical Exam/Data:   Vitals:   05/02/17 2200 05/02/17 2230 05/02/17 2300 05/03/17 0008  BP: 118/67 112/79 121/64 (!) 146/72  Pulse: 90 89 91 82  Resp: 18 17 18 16   Temp:    98.2 F (36.8 C)  TempSrc:  Oral  SpO2: 99% 99% 99% 99%  Weight:    228 lb 2.8 oz (103.5 kg)  Height:    5\' 5"  (1.651 m)   No intake or output data in the 24 hours ending 05/03/17 0118 Filed Weights   05/02/17 1706 05/03/17 0008  Weight: 231 lb (104.8 kg) 228 lb 2.8 oz (103.5 kg)   Body mass index is 37.97 kg/m.  General:  Well nourished, well developed, in no acute distress HEENT: normal Lymph: no adenopathy Neck: no increase  JVD Endocrine:  No thryomegaly Vascular: No carotid bruits; FA pulses 2+ bilaterally without bruits  Cardiac:  normal S1, S2; RRR; no murmur Lungs:  clear to auscultation bilaterally, no wheezing, rhonchi or rales  Abd: soft, nontender, no hepatomegaly  Ext: no edema Musculoskeletal:  No deformities, BUE and BLE strength normal and equal Skin: warm and dry  Neuro:  CNs 2-12 intact, no focal abnormalities noted Psych:  Normal affect    EKG:  The ECG ordered , NSR , LCH  ST  Changes  Secondary to LVH   Relevant CV Studies:    Nuclear stress EF: 31%.  There was no ST segment deviation noted during stress.  Defect 1: There is a medium defect of moderate severity present in the basal inferolateral, mid inferolateral and apical inferior location.  Findings consistent with ischemia.  This is an intermediate risk study.  The left ventricular ejection fraction is moderately decreased (30-44%). There is a medium size area mild  severity reversible defect in the basal and mid inferolateral walls consistent with ischemia. LVEF is moderately decreased.   Echo 04/19/17 Study Conclusions - Left ventricle: The cavity size was normal. Wall thickness was increased in a pattern of severe LVH. Systolic function was vigorous. The estimated ejection fraction was in the range of 65% to 70%. Doppler parameters are consistent with abnormal left ventricular relaxation (grade 1 diastolic dysfunction) Laboratory Data:  Chemistry Recent Labs  Lab 04/30/17 0929 05/02/17 1724  NA 134* 135  K 4.4 4.2  CL 102 101  CO2 21* 23  GLUCOSE 107* 102*  BUN 18 13  CREATININE 1.04 1.03  CALCIUM 9.0 9.2  GFRNONAA >60 >60  GFRAA >60 >60  ANIONGAP 11 11    No results for input(s): PROT, ALBUMIN, AST, ALT, ALKPHOS, BILITOT in the last 168 hours. Hematology Recent Labs  Lab 04/30/17 0929 05/02/17 1724  WBC 8.2 9.3  RBC 5.85* 5.99*  HGB 15.0 15.4  HCT 45.0 44.8  MCV 76.9* 74.8*  MCH 25.6* 25.7*  MCHC 33.3 34.4  RDW 14.0 14.6  PLT 258 275   Cardiac Enzymes Recent Labs  Lab 05/02/17 1724 05/02/17 2012  TROPONINI 0.03* 0.03*    Recent Labs  Lab 04/30/17 0939 04/30/17 1349  TROPIPOC 0.00 0.03    BNPNo results for input(s): BNP, PROBNP in the last 168 hours.  DDimer No results for input(s): DDIMER in the last 168 hours.  Radiology/Studies:  Dg Chest 2 View  Result Date: 05/02/2017 CLINICAL DATA:  Chest pain. EXAM: CHEST - 2 VIEW COMPARISON:  Radiographs of April 30, 2017. FINDINGS: The heart size and mediastinal contours are within normal limits. Both lungs are clear. No pneumothorax or pleural effusion is noted. The visualized skeletal structures are unremarkable. IMPRESSION: No active cardiopulmonary disease. Electronically Signed   By: Lupita Raider, M.D.   On: 05/02/2017 17:25    Assessment and Plan:       1. Essential hypertension   2. Abnormal myocardial perfusion  study    PLAN:       Nuclear stress EF: 31%.  There was no ST segment deviation noted during stress.  Defect 1: There is a medium defect of moderate severity present in the basal inferolateral, mid inferolateral and apical inferior location.  Findings consistent with ischemia.  This is an intermediate risk study.  The left ventricular ejection fraction is moderately decreased (30-44%). There is a medium size area mild severity reversible defect in the basal and mid inferolateral walls consistent with ischemia. LVEF is moderately decreased.   Echo 04/19/17 Study Conclusions - Left ventricle: The cavity size was normal. Wall thickness was increased in a pattern of severe LVH. Systolic function was vigorous. The estimated ejection fraction was in the range of 65% to 70%. Doppler parameters are consistent with abnormal left ventricular relaxation (grade 1 diastolic dysfunction)  In order of problems listed above:  1. Unstable symptoms consistent with acute coronary syndrome whether or not his enzymes are positive he has a markedly abnormal EKG at rest abnormal myocardial perfusion study multiple ED visits for chest pain and will be treated as acute coronary syndrome as an inpatient.- ASA, statin , BB, oxygen , hep gtt, nitro s/l- cath orders  AM 3/28 2. Hypertension stable continue current treatment I will have him start a beta-blocker and continue his medications with diuretic calcium channel blocker and ACE.  He will also benefit from a statin if enzymes are abnormal Lovenox.   3. He snores severely may require an outpatient sleep test Severity of Illness: The appropriate patient status for this patient is INPATIENT. Inpatient status is judged to be reasonable and necessary in order to provide the required intensity of service to ensure the patient's safety. The patient's presenting symptoms, physical exam findings, and initial radiographic and laboratory data in the context of their chronic  comorbidities is felt to place them at high risk for further clinical deterioration. Furthermore, it is not anticipated that the patient will be medically stable for discharge from the hospital within 2 midnights of admission. The following factors support the patient status of inpatient.   " The patient's presenting symptoms include ABNL  EKG, .ABNL  NUC  STUDY  " The worrisome physical exam findings include CHEST PAIN  " The initial radiographic and laboratory data are worrisome because of ABNORMAL nuc   STUDY  " The chronic co-morbidities include **HTN , smoking  * I certify that at the point of admission it is my clinical judgment that the patient will require inpatient hospital care spanning beyond 2 midnights from the point of admission due to high intensity of service, high risk for further deterioration and high frequency of surveillance required.*   Cardic cath  Orders  Placed by On call Interventional accepting provider Dr Katrinka Blazing ,  Cardiac  Cath  Risk benefits  Will be  Discussed by Interventional cardiology, as  Well , and  We  Informed  Risks not limiting to death , MI, stroke , infection , renal failure, infection    Signed, Lynwood Dawley, MD  05/03/2017 1:18 AM

## 2017-05-03 NOTE — Progress Notes (Signed)
  Echocardiogram 2D Echocardiogram has been performed.  Luke Wade 05/03/2017, 12:31 PM

## 2017-05-03 NOTE — Progress Notes (Signed)
[facility] BAND REMOVAL  LOCATION:    right radial  DEFLATED PER PROTOCOL:    Yes.    TIME BAND OFF / DRESSING APPLIED:    1230   SITE UPON ARRIVAL:    Level 0  SITE AFTER BAND REMOVAL:    Level 0  CIRCULATION SENSATION AND MOVEMENT:    Within Normal Limits   Yes.    COMMENTS:   Post removal instructions provided with effective teach back. Dressing in place C/D/I, no bleeding and no hematoma noted. Tolerated well.

## 2017-05-04 ENCOUNTER — Other Ambulatory Visit: Payer: Self-pay | Admitting: *Deleted

## 2017-05-04 MED FILL — Heparin Sodium (Porcine) 2 Unit/ML in Sodium Chloride 0.9%: INTRAMUSCULAR | Qty: 1000 | Status: AC

## 2017-05-04 NOTE — Patient Outreach (Signed)
[facility] Care Management  05/04/2017  Luke Wade 10-29-1980 [id number]   Subjective: Telephone call to patient's home / mobile number, spoke with patient, and HIPAA verified.  Discussed [facility] Transition of care follow up, patient voiced understanding, and is in agreement to follow up.    Patient states he is doing well, incision with soreness, has a blood pressure cuff for monitoring as needed, will call to set up follow up appointment with primary MD (Dr. Harmon DunAndy Wade), and has follow up appointment with cardiologist on 05/17/17.  Patient states he is able to manage self care and has assistance as needed with activities of daily living / home management.   Patient voices understanding of medical diagnosis and treatment plan.  States he is accessing the following [facility] benefits: outpatient pharmacy (not currently using, [facility], RNCM will email patient pharmacy contact number),  hospital indemnity (not a chosen benefit),  will call [employer] today to follow up family medical leave act (FMLA) denial, and start request process for  Americans with Disabilities Act (ADA) accommodation. Discussed hypertension disease management resources for [facility] and patient declined referral at this time.  Patient states he does not have any education material, transition of care, care coordination, disease management, disease monitoring, transportation, community resource, or pharmacy needs at this time.  States he is very appreciative of the follow up and is in agreement to receive [facility] information.     Objective:Per KPN (Knowledge Performance Now, point of care tool) and chart review, patient hospitalized 05/02/17 - 05/03/17 for chest pain.  Patient also has a history of hypertension.    Assessment: Received [facility] Transition of care referral on 05/03/17.   Transition of care follow up completed, no care  management needs, and will proceed with case closure.     Plan: RNCM will send patient successful outreach letter, [facility] pamph[facility], and magnet. RNCM will complete case closure due to follow up completed / no care management needs.  RNCM will send secure email to patient with [facility] contact information per patient's request.      Theodore Rahrig H. Gardiner Barefootooper RN, BSN, CCM [facility] Care Management [facility] Telephonic CM Phone: [phone] Fax: [phone]

## 2017-05-08 ENCOUNTER — Telehealth: Payer: Self-pay | Admitting: Cardiology

## 2017-05-08 NOTE — Telephone Encounter (Signed)
Informed patient that he would need to bring this paper work to the office to go through the proper process.

## 2017-05-08 NOTE — Telephone Encounter (Signed)
Needs a form filled out for his work

## 2017-05-11 ENCOUNTER — Ambulatory Visit (HOSPITAL_BASED_OUTPATIENT_CLINIC_OR_DEPARTMENT_OTHER): Payer: 59

## 2017-05-14 ENCOUNTER — Ambulatory Visit (HOSPITAL_BASED_OUTPATIENT_CLINIC_OR_DEPARTMENT_OTHER)
Admission: RE | Admit: 2017-05-14 | Discharge: 2017-05-14 | Disposition: A | Discharge status: Home / Self Care | Payer: 59 | Source: Ambulatory Visit | Attending: Cardiology | Admitting: Cardiology

## 2017-05-14 DIAGNOSIS — I10 Essential (primary) hypertension: Secondary | ICD-10-CM | POA: Insufficient documentation

## 2017-05-14 NOTE — Progress Notes (Signed)
  Renal artery duplex exam performed. Dorothey BasemanReel, Burr Soffer M 05/14/2017, 10:47 AM

## 2017-05-15 ENCOUNTER — Other Ambulatory Visit: Payer: Self-pay

## 2017-05-15 DIAGNOSIS — R93429 Abnormal radiologic findings on diagnostic imaging of unspecified kidney: Secondary | ICD-10-CM

## 2017-05-15 DIAGNOSIS — I10 Essential (primary) hypertension: Secondary | ICD-10-CM

## 2017-05-16 ENCOUNTER — Encounter (HOSPITAL_BASED_OUTPATIENT_CLINIC_OR_DEPARTMENT_OTHER): Payer: Self-pay

## 2017-05-16 ENCOUNTER — Ambulatory Visit (HOSPITAL_BASED_OUTPATIENT_CLINIC_OR_DEPARTMENT_OTHER)
Admission: RE | Admit: 2017-05-16 | Discharge: 2017-05-16 | Disposition: A | Discharge status: Home / Self Care | Payer: 59 | Source: Ambulatory Visit | Attending: Cardiology | Admitting: Cardiology

## 2017-05-16 ENCOUNTER — Other Ambulatory Visit: Payer: Self-pay

## 2017-05-16 ENCOUNTER — Telehealth: Payer: Self-pay

## 2017-05-16 ENCOUNTER — Emergency Department (HOSPITAL_BASED_OUTPATIENT_CLINIC_OR_DEPARTMENT_OTHER)
Admission: EM | Admit: 2017-05-16 | Discharge: 2017-05-16 | Disposition: A | Discharge status: Home / Self Care | Payer: 59 | Attending: Emergency Medicine | Admitting: Emergency Medicine

## 2017-05-16 DIAGNOSIS — K5790 Diverticulosis of intestine, part unspecified, without perforation or abscess without bleeding: Secondary | ICD-10-CM

## 2017-05-16 DIAGNOSIS — I776 Arteritis, unspecified: Secondary | ICD-10-CM | POA: Insufficient documentation

## 2017-05-16 DIAGNOSIS — T782XXA Anaphylactic shock, unspecified, initial encounter: Secondary | ICD-10-CM | POA: Diagnosis not present

## 2017-05-16 DIAGNOSIS — Z87891 Personal history of nicotine dependence: Secondary | ICD-10-CM | POA: Diagnosis not present

## 2017-05-16 DIAGNOSIS — L959 Vasculitis limited to the skin, unspecified: Secondary | ICD-10-CM | POA: Diagnosis not present

## 2017-05-16 DIAGNOSIS — I10 Essential (primary) hypertension: Secondary | ICD-10-CM

## 2017-05-16 DIAGNOSIS — L299 Pruritus, unspecified: Secondary | ICD-10-CM | POA: Diagnosis present

## 2017-05-16 DIAGNOSIS — K4090 Unilateral inguinal hernia, without obstruction or gangrene, not specified as recurrent: Secondary | ICD-10-CM

## 2017-05-16 DIAGNOSIS — R93429 Abnormal radiologic findings on diagnostic imaging of unspecified kidney: Secondary | ICD-10-CM

## 2017-05-16 DIAGNOSIS — L509 Urticaria, unspecified: Secondary | ICD-10-CM | POA: Insufficient documentation

## 2017-05-16 DIAGNOSIS — F1210 Cannabis abuse, uncomplicated: Secondary | ICD-10-CM | POA: Insufficient documentation

## 2017-05-16 DIAGNOSIS — I701 Atherosclerosis of renal artery: Secondary | ICD-10-CM | POA: Diagnosis not present

## 2017-05-16 IMAGING — CT CT CTA ABD/PEL W/CM AND/OR W/O CM
2 of 9 series · 10 of 46 positions shown, 15 images · IV contrast (APPLIED)
Comparison: None.

CLINICAL DATA: 36-year-old male with a history of abnormal renal
ultrasound and suspected renovascular hypertension

EXAM:
CTA ABDOMEN AND PELVIS wITHOUT AND WITH CONTRAST
TECHNIQUE: Multidetector CT imaging of the abdomen and pelvis was performed
using the standard protocol during bolus administration of
intravenous contrast. Multiplanar reconstructed images and MIPs were
obtained and reviewed to evaluate the vascular anatomy.
CONTRAST:  100mL [XP] IOPAMIDOL ([XP]) INJECTION 76%

[Series 5: coronal arterial · coronal · arterial · 0.80mm/px · 2 of 123 slices shown]
[im 41/123  soft-tissue]
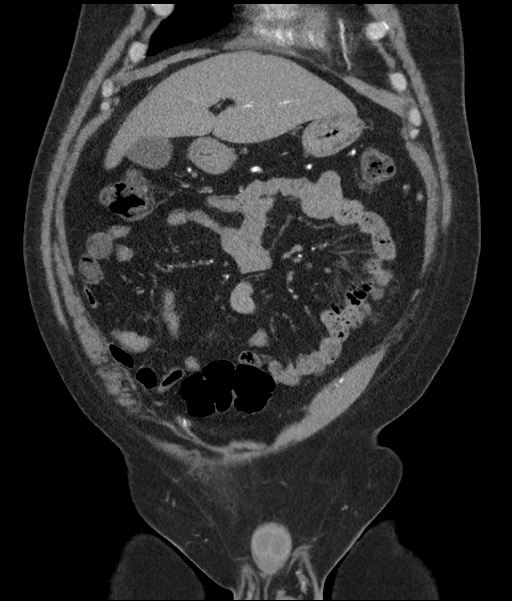
[im 82/123  soft-tissue]
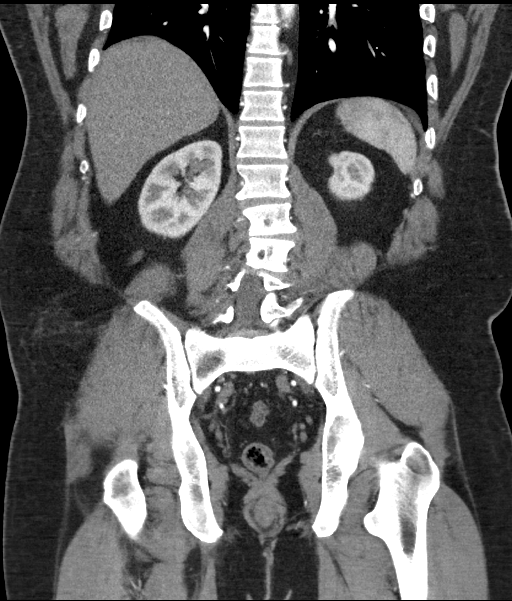

[Series 10: reanl axial venous · axial · portal-venous · 0.96mm/px · z∈[-465,-95]mm · 8 of 94 slices shown, 13 images]
[im 10/94  soft-tissue]
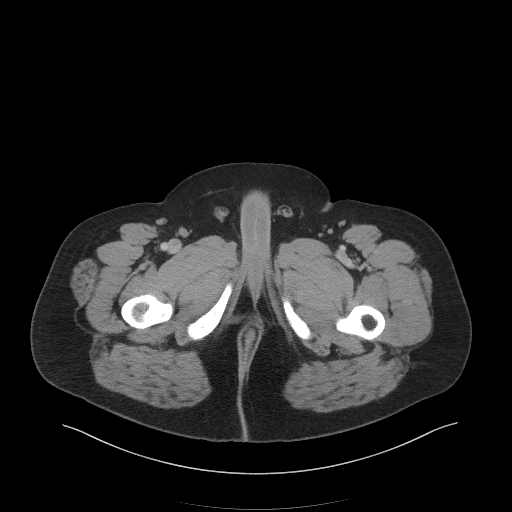
[im 10/94  bone]
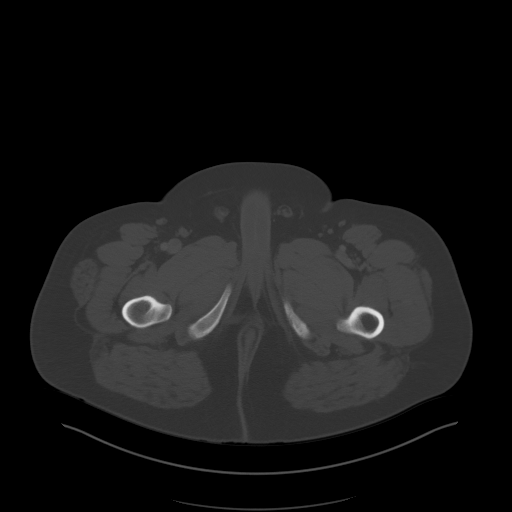
[im 19/94  soft-tissue]
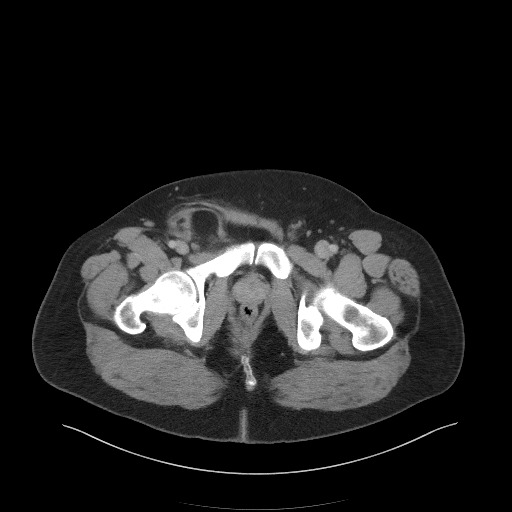
[im 28/94  soft-tissue]
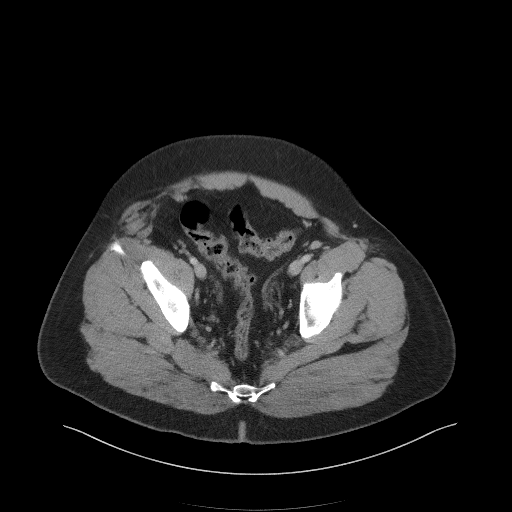
[im 38/94  soft-tissue]
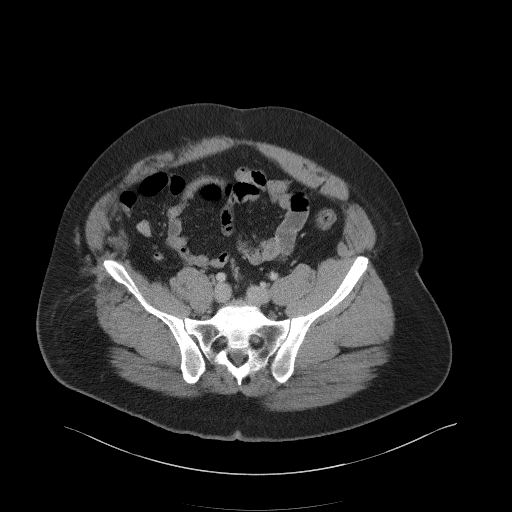
[im 56/94  soft-tissue]
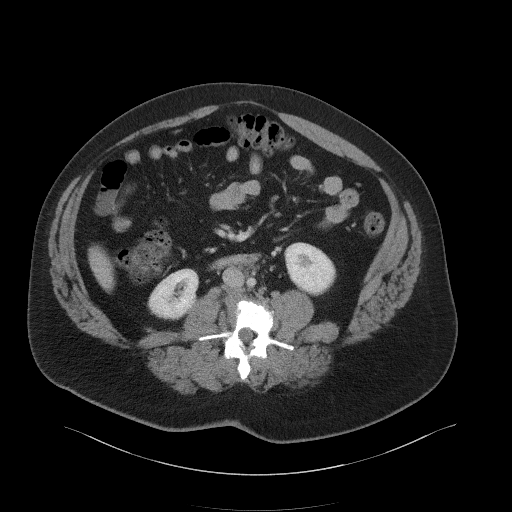
[im 56/94  lung]
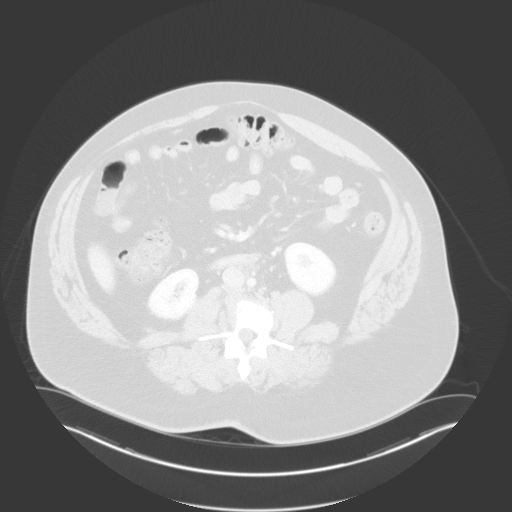
[im 66/94  soft-tissue]
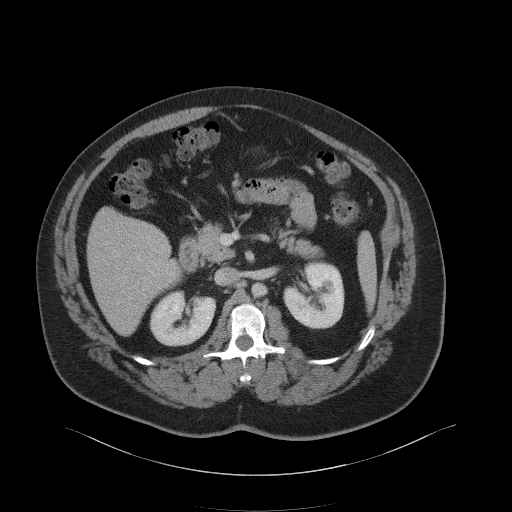
[im 66/94  lung]
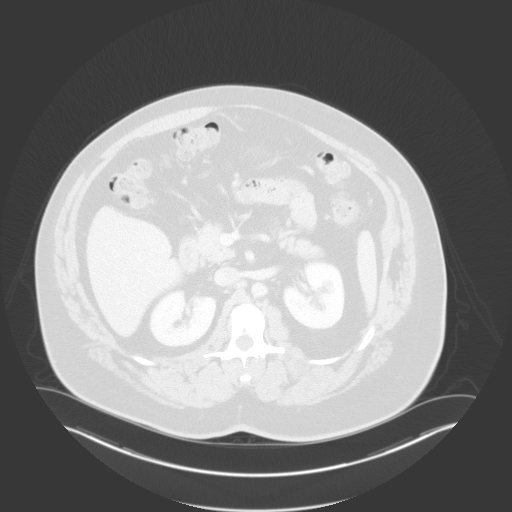
[im 75/94  soft-tissue]
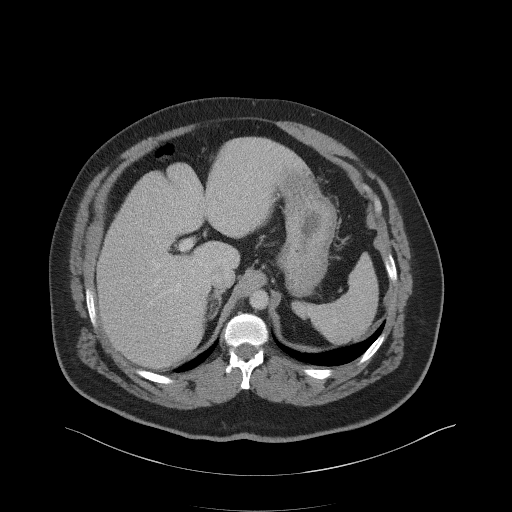
[im 75/94  lung]
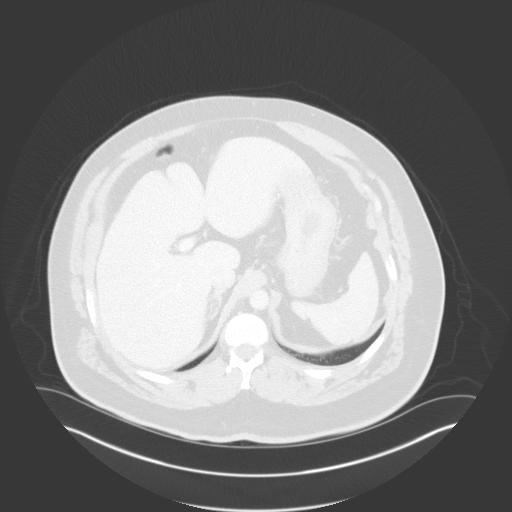
[im 84/94  soft-tissue]
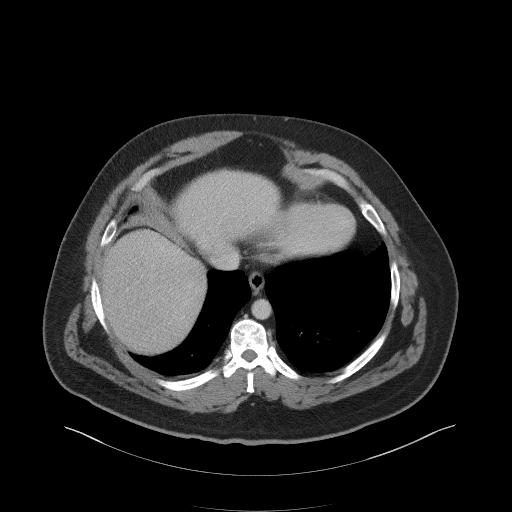
[im 84/94  lung]
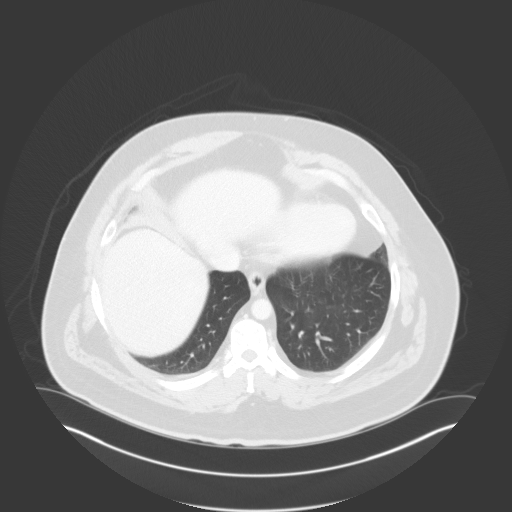

[10 of 46 positions shown; findings below may reference images not displayed]

FINDINGS: VASCULAR

Aorta: Distal thoracic and the suprarenal abdominal aorta within
normal limits without significant atherosclerotic changes. No
aneurysm or dissection flap.

The infrarenal abdominal aorta demonstrates a short segment of
narrowing, which expands to a more normal caliber at the
bifurcation. The diameter at the tapered segment measures
approximately 9 [XP] mm, and there is a questionable linear filling
defect/flap at this site.

Secondary findings of abnormal soft tissue surrounding the proximal
inferior mesenteric artery extending inferiorly and along the course
of the left colic artery, as well as along the superior rectal
arteries. All of these arteries appear patent, with circumferential
soft tissue. Additionally, there is questionable wall thickening of
the proximal superior mesenteric artery, without abnormal soft
tissue.

Small lymph nodes in the periaortic nodal stations.

Celiac: Celiac artery remains patent with no significant stenosis.
No significant atherosclerotic changes. No beading.

SMA: Superior mesenteric artery is patent, with the sagittal
reformatted images demonstrating questionable thickening of the
arterial wall. No significant atherosclerotic changes.

Renals: Single bilateral renal arteries. No significant
atherosclerotic changes at the origin of the renal arteries. No
circumferential soft tissue. No beading or dissection.

IMA: Inferior mesenteric artery is patent. There is circumferential
soft tissue surrounding the IMA just after the origin.

Right lower extremity:

No significant atherosclerotic changes of the right iliac system. No
stenosis/narrowing. No occlusion. Hypogastric artery is patent.

Proximal profunda femoris and SFA patent.

Left lower extremity:

No significant atherosclerotic changes of the left iliac system. No
stenosis/narrowing. No occlusion. Hypogastric arteries patent.
Proximal profunda femoris and SFA patent.

Veins: Unremarkable appearance of the venous system.

Review of the MIP images confirms the above findings.

NON-VASCULAR

Lower chest: No acute.

Hepatobiliary: Unremarkable appearance of the liver. Unremarkable
gall bladder.

Pancreas: Unremarkable appearance of the pancreas. No
pericholecystic fluid or inflammatory changes. Unremarkable ductal
system.

Spleen: Unremarkable.

Adrenals/Urinary Tract: Unremarkable appearance of adrenal glands.

Right:

No hydronephrosis. Symmetric perfusion to the left. No
nephrolithiasis. Unremarkable course of the right ureter.

Left:

No hydronephrosis. Symmetric perfusion to the right. No
nephrolithiasis. Unremarkable course of the left ureter.

Unremarkable appearance of the urinary bladder .

Stomach/Bowel: Unremarkable appearance of the stomach. Unremarkable
appearance of small bowel. No evidence of obstruction. Colonic
diverticula without evidence of acute inflammatory changes. Normal
appendix.

Lymphatic: Multiple lymph nodes in the para-aortic nodal station,
none of which are enlarged.

Mesenteric: No free fluid or air. No adenopathy.

Reproductive: Unremarkable appearance of the pelvic organs.

Other: Fat containing right inguinal hernia, with the hernia ELIJAH
medial to the epigastric artery. Small fat containing umbilical
hernia.

Musculoskeletal: No acute displaced fracture. There is scalloping
along the right aspect of lumbar vertebral bodies L1, L2, L3, L4,
and L5 with associated hypoplastic right-sided pedicles.

No significant degenerative changes.
IMPRESSION: No acute CT finding.

CT findings are concerning for chronic vasculitis, with vascular
changes involving inferior mesenteric artery, the aorta, and
questionably of the proximal superior mesenteric artery. Changes
include circumferential soft tissue surrounding the inferior
mesenteric artery and the proximal left colic artery and superior
rectal arteries. Differential diagnosis includes lymphoma as well as
retroperitoneal fibrosis.

There is a short segment of infrarenal abdominal aorta that is
narrowed, which may be secondary to changes of vasculitis, or
alternatively could represent mid aortic syndrome. If this
represents mid aortic syndrome, the location of narrowing would not
account for decreased renal blood flow and renovascular
hypertension, as the segment of narrowing does not involve the renal
artery origins. If the patient has symptoms of lower extremity
claudication, correlation with noninvasive exam may be useful.

The study is negative for significant narrowing or atherosclerotic
changes of the renal arteries.

Abnormal configuration of the lumbar vertebral bodies with
scalloping along the right L1-L5 and associated small right-sided
pedicles at each level. The changes appear chronic, with no active
destructive changes present. Correlation with any history of prior
radiation therapy, trauma, or a medical history including
neurofibromatosis may elicit the etiology.

Right-sided fat containing inguinal hernia, appears to be direct
type.

Diverticular disease without evidence of acute diverticulitis.

These results were discussed by telephone at the time of
interpretation on [DATE] at [DATE] with amber at Dr. ELIJAH
[REDACTED].

## 2017-05-16 MED ORDER — PREDNISONE 20 MG PO TABS: ORAL_TABLET | ORAL | 0 refills | Status: DC

## 2017-05-16 MED ORDER — DIPHENHYDRAMINE HCL 50 MG/ML IJ SOLN
25.0000 mg | Freq: Once | INTRAMUSCULAR | Status: AC
  Administered 2017-05-16: 25 mg via INTRAVENOUS
  Filled 2017-05-16: qty 1

## 2017-05-16 MED ORDER — EPINEPHRINE 0.3 MG/0.3ML IJ SOAJ: 0.3000 mg | Freq: Once | INTRAMUSCULAR | 1 refills | Status: AC

## 2017-05-16 MED ORDER — FAMOTIDINE 20 MG PO TABS: 20.0000 mg | ORAL_TABLET | Freq: Two times a day (BID) | ORAL | 0 refills | Status: DC

## 2017-05-16 MED ORDER — EPINEPHRINE 0.3 MG/0.3ML IJ SOAJ
0.3000 mg | Freq: Once | INTRAMUSCULAR | Status: AC
  Administered 2017-05-16: 0.3 mg via INTRAMUSCULAR
  Filled 2017-05-16: qty 0.3

## 2017-05-16 MED ORDER — IOPAMIDOL (ISOVUE-370) INJECTION 76%
100.0000 mL | Freq: Once | INTRAVENOUS | Status: AC | PRN
  Administered 2017-05-16: 100 mL via INTRAVENOUS

## 2017-05-16 MED ORDER — DIPHENHYDRAMINE HCL 25 MG PO TABS: 25.0000 mg | ORAL_TABLET | Freq: Four times a day (QID) | ORAL | 0 refills | Status: DC | PRN

## 2017-05-16 MED ORDER — FAMOTIDINE IN NACL 20-0.9 MG/50ML-% IV SOLN
20.0000 mg | Freq: Once | INTRAVENOUS | Status: AC
  Administered 2017-05-16: 20 mg via INTRAVENOUS
  Filled 2017-05-16: qty 50

## 2017-05-16 MED ORDER — METHYLPREDNISOLONE SODIUM SUCC 125 MG IJ SOLR
125.0000 mg | Freq: Once | INTRAMUSCULAR | Status: AC
  Administered 2017-05-16: 125 mg via INTRAVENOUS
  Filled 2017-05-16: qty 2

## 2017-05-16 MED FILL — FAMOTIDINE 20 MG TABLET: 20 | 5 days supply | Qty: 10 | Fill #0

## 2017-05-16 MED FILL — BANOPHEN 25 MG CAPSULE: 25 | 25 days supply | Qty: 100 | Fill #0

## 2017-05-16 MED FILL — predniSONE 20 MG TABS: 20 | 4 days supply | Qty: 8 | Fill #0

## 2017-05-16 MED FILL — EPINEPHRINE 0.3 MG AUTO-INJ: 0.3 | 2 days supply | Qty: 2 | Fill #0

## 2017-05-16 NOTE — Telephone Encounter (Signed)
Informed patient's PCP of the CTA results and that the patient would need to be seen asap per Dr. Tomie Chinaevankar. Results were faxed to the PCP. Per the PCP the patient has an appointment to be seen tomorrow.

## 2017-05-16 NOTE — ED Provider Notes (Signed)
Emergency Department Provider Note   I have reviewed the triage vital signs and the nursing notes.   HISTORY  Chief Complaint Allergic Reaction   HPI Luke Wade is a 37 y.o. male with multiple medical problems as documented below that presents to ER with allergic reaction. Recently discharged from hospital after L heart Cath that was clean. Found to have renal aretery stenosis on [place] a couple days ago so was getting angio of abdomen done to eval for the extent and shortly after CT scan had onset of pruritus and subsequently with hives. No n/v/sob/diarrhea/syncope but had never had this before so came directly here for evaluation. No h/o allergies to anything that he knows fo. States itching is slightly improved from when it started.   No other associated or modifying symptoms.    Past Medical History:  Diagnosis Date  . Abnormal myocardial perfusion study 05/02/2017  . Chest pain 04/16/2017  . Cigarette smoker 04/16/2017  . Hypertension 04/16/2017  . Overweight 04/16/2017    Patient Active Problem List   Diagnosis Date Noted  . Unstable angina (HCC) 05/03/2017  . Abnormal myocardial perfusion study 05/02/2017  . ACS (acute coronary syndrome) (HCC) 05/02/2017  . Chest pain 04/16/2017  . Hypertension 04/16/2017  . Cigarette smoker 04/16/2017  . Overweight 04/16/2017    Past Surgical History:  Procedure Laterality Date  . LEFT HEART CATH AND CORONARY ANGIOGRAPHY N/A 05/03/2017   Procedure: LEFT HEART CATH AND CORONARY ANGIOGRAPHY;  Surgeon: Marykay Lex, MD;  Location: [facility] INVASIVE CV LAB;  Service: Cardiovascular;  Laterality: N/A;  . NO PAST SURGERIES      Current Outpatient Rx  . Order #: [id number] Class: Print  . Order #: [id number] Class: Print  . Order #: [id number] Class: Historical Med  . Order #: [id number] Class: Print  . Order #: [id number] Class: Normal  . Order #: [id number] Class: Print  . Order #: [id number] Class: Normal    Allergies Contrast media  [iodinated diagnostic agents]  Family History  Problem Relation Age of Onset  . Cancer Mother   . Cancer Father     Social History Social History   Tobacco Use  . Smoking status: Former Smoker    Packs/day: 0.25    Types: Cigarettes  . Smokeless tobacco: Never Used  Substance Use Topics  . Alcohol use: Yes    Comment: weekends   . Drug use: Not Currently    Types: Marijuana    Review of Systems  All other systems negative except as documented in the HPI. All pertinent positives and negatives as reviewed in the HPI. ____________________________________________   PHYSICAL EXAM:  VITAL SIGNS: ED Triage Vitals  Enc Vitals Group     BP 05/16/17 1231 (!) 155/80     Pulse Rate 05/16/17 1231 80     Resp 05/16/17 1231 18     Temp 05/16/17 1231 98.8 F (37.1 C)     Temp Source 05/16/17 1231 Oral     SpO2 05/16/17 1231 99 %     Weight 05/16/17 1231 233 lb (105.7 kg)     Height 05/16/17 1231 5\' 5"  (1.651 m)    Constitutional: Alert and oriented. Well appearing and in no acute distress. Eyes: Conjunctivae are normal. PERRL. EOMI. Head: Atraumatic. Nose: No congestion/rhinnorhea. Mouth/Throat: Mucous membranes are moist.  Oropharynx non-erythematous. Neck: No stridor.  No meningeal signs.   Cardiovascular: Normal rate, regular rhythm. Good peripheral circulation. Grossly normal heart sounds.   Respiratory: Normal respiratory effort.  No retractions. Lungs with diffuse  end expiratory wheezing.. Gastrointestinal: Soft and nontender. No distention.  Musculoskeletal: No lower extremity tenderness nor edema. No gross deformities of extremities. Neurologic:  Normal speech and language. No gross focal neurologic deficits are appreciated.  Skin:  Skin is warm, dry and intact. Hives noted to upper back, face and neck.   ____________________________________________   LABS (all labs ordered are listed, but only abnormal results are displayed)  Labs Reviewed - No data to  display ____________________________________________  EKG   EKG Interpretation  Date/Time:  Wednesday May 16 2017 13:44:33 EDT Ventricular Rate:  93 PR Interval:    QRS Duration: 97 QT Interval:  371 QTC Calculation: 462 R Axis:   59 Text Interpretation:  Sinus rhythm Probable left atrial enlargement LVH with secondary repolarization abnormality ST depr, consider ischemia, inferior leads Anterior ST elevation, probably due to LVH similar to earlier in day Confirmed by Marily MemosMesner, Anik Wesch [phone]([id number]) on 05/16/2017 3:47:04 PM       ____________________________________________  RADIOLOGY  Ct Angio Abdomen Pelvis  W &/or Wo Contrast  Result Date: 05/16/2017 CLINICAL DATA:  37 year old male with a history of abnormal renal ultrasound and suspected renovascular hypertension EXAM: CTA ABDOMEN AND PELVIS wITHOUT AND WITH CONTRAST TECHNIQUE: Multidetector CT imaging of the abdomen and pelvis was performed using the standard protocol during bolus administration of intravenous contrast. Multiplanar reconstructed images and MIPs were obtained and reviewed to evaluate the vascular anatomy. CONTRAST:  100mL ISOVUE-370 IOPAMIDOL (ISOVUE-370) INJECTION 76% COMPARISON:  None. FINDINGS: VASCULAR Aorta: Distal thoracic and the suprarenal abdominal aorta within normal limits without significant atherosclerotic changes. No aneurysm or dissection flap. The infrarenal abdominal aorta demonstrates a short segment of narrowing, which expands to a more normal caliber at the bifurcation. The diameter at the tapered segment measures approximately 9 mm-10 mm, and there is a questionable linear filling defect/flap at this site. Secondary findings of abnormal soft tissue surrounding the proximal inferior mesenteric artery extending inferiorly and along the course of the left colic artery, as well as along the superior rectal arteries. All of these arteries appear patent, with circumferential soft tissue. Additionally, there is  questionable wall thickening of the proximal superior mesenteric artery, without abnormal soft tissue. Small lymph nodes in the periaortic nodal stations. Celiac: Celiac artery remains patent with no significant stenosis. No significant atherosclerotic changes. No beading. SMA: Superior mesenteric artery is patent, with the sagittal reformatted images demonstrating questionable thickening of the arterial wall. No significant atherosclerotic changes. Renals: Single bilateral renal arteries. No significant atherosclerotic changes at the origin of the renal arteries. No circumferential soft tissue. No beading or dissection. IMA: Inferior mesenteric artery is patent. There is circumferential soft tissue surrounding the IMA just after the origin. Right lower extremity: No significant atherosclerotic changes of the right iliac system. No stenosis/narrowing. No occlusion. Hypogastric artery is patent. Proximal profunda femoris and SFA patent. Left lower extremity: No significant atherosclerotic changes of the left iliac system. No stenosis/narrowing. No occlusion. Hypogastric arteries patent. Proximal profunda femoris and SFA patent. Veins: Unremarkable appearance of the venous system. Review of the MIP images confirms the above findings. NON-VASCULAR Lower chest: No acute. Hepatobiliary: Unremarkable appearance of the liver. Unremarkable gall bladder. Pancreas: Unremarkable appearance of the pancreas. No pericholecystic fluid or inflammatory changes. Unremarkable ductal system. Spleen: Unremarkable. Adrenals/Urinary Tract: Unremarkable appearance of adrenal glands. Right: No hydronephrosis. Symmetric perfusion to the left. No nephrolithiasis. Unremarkable course of the right ureter. Left: No hydronephrosis. Symmetric perfusion to the right. No nephrolithiasis. Unremarkable course of the left ureter. Unremarkable  appearance of the urinary bladder . Stomach/Bowel: Unremarkable appearance of the stomach. Unremarkable  appearance of small bowel. No evidence of obstruction. Colonic diverticula without evidence of acute inflammatory changes. Normal appendix. Lymphatic: Multiple lymph nodes in the para-aortic nodal station, none of which are enlarged. Mesenteric: No free fluid or air. No adenopathy. Reproductive: Unremarkable appearance of the pelvic organs. Other: Fat containing right inguinal hernia, with the hernia seg medial to the epigastric artery. Small fat containing umbilical hernia. Musculoskeletal: No acute displaced fracture. There is scalloping along the right aspect of lumbar vertebral bodies L1, L2, L3, L4, and L5 with associated hypoplastic right-sided pedicles. No significant degenerative changes. IMPRESSION: No acute CT finding. CT findings are concerning for chronic vasculitis, with vascular changes involving inferior mesenteric artery, the aorta, and questionably of the proximal superior mesenteric artery. Changes include circumferential soft tissue surrounding the inferior mesenteric artery and the proximal left colic artery and superior rectal arteries. Differential diagnosis includes lymphoma as well as retroperitoneal fibrosis. There is a short segment of infrarenal abdominal aorta that is narrowed, which may be secondary to changes of vasculitis, or alternatively could represent mid aortic syndrome. If this represents mid aortic syndrome, the location of narrowing would not account for decreased renal blood flow and renovascular hypertension, as the segment of narrowing does not involve the renal artery origins. If the patient has symptoms of lower extremity claudication, correlation with noninvasive exam may be useful. The study is negative for significant narrowing or atherosclerotic changes of the renal arteries. Abnormal configuration of the lumbar vertebral bodies with scalloping along the right L1-L5 and associated small right-sided pedicles at each level. The changes appear chronic, with no active  destructive changes present. Correlation with any history of prior radiation therapy, trauma, or a medical history including neurofibromatosis may elicit the etiology. Right-sided fat containing inguinal hernia, appears to be direct type. Diverticular disease without evidence of acute diverticulitis. These results were discussed by telephone at the time of interpretation on 05/16/2017 at 2:39 pm with amber at Dr. Annabell Howells office. Signed, Yvone Neu. Loreta Ave, DO [facility] Rad[facility] Electronically Signed   By: Gilmer Mor D.O.   On: 05/16/2017 14:40    ____________________________________________   PROCEDURES  Procedure(s) performed:   Procedures  CRITICAL CARE Performed by: Marily Memos Total critical care time: 35 minutes Critical care time was exclusive of separately billable procedures and treating other patients. Critical care was necessary to treat or prevent imminent or life-threatening deterioration. Critical care was time spent personally by me on the following activities: development of treatment plan with patient and/or surrogate as well as nursing, discussions with consultants, evaluation of patient's response to treatment, examination of patient, obtaining history from patient or surrogate, ordering and performing treatments and interventions, ordering and review of laboratory studies, ordering and review of radiographic studies, pulse oximetry and re-evaluation of patient's condition.  ____________________________________________   INITIAL IMPRESSION / ASSESSMENT AND PLAN / ED COURSE  With wheezing (quit smoking a month ago), rash after contrast administration, highest concern at this time is for anaphylaxis. Although the wheezing could be from preexisting illness he doesn't remember having coughing/sob recently so suggest it was present prior to contrast. Will need 4 hour obs and epi pen rx.  Ct shows renal artery stenosis  (likely), but also concerning for possible thrombus in IVC, contacted radiology and will ask them to read it quicker to ensure this is not the case.   Ct shows vasculitis, explained he needs  pcp follow up for this for further workup and referral.   Care transferred to Dr. Vanessa Barbara pending 1 more hour (for 4 total) of observation for anaphylaxis. Discussed rebound reactions with him. rx's will be provided on discharge.      Pertinent labs & imaging results that were available during my care of the patient were reviewed by me and considered in my medical decision making (see chart for details).  ____________________________________________  FINAL CLINICAL IMPRESSION(S) / ED DIAGNOSES  Final diagnoses:  Anaphylaxis, initial encounter  Vasculitis (HCC)  Renal artery stenosis (HCC)     MEDICATIONS GIVEN DURING THIS VISIT:  Medications  EPINEPHrine (EPI-PEN) injection 0.3 mg (0.3 mg Intramuscular Given 05/16/17 1252)  methylPREDNISolone sodium succinate (SOLU-MEDROL) 125 mg/2 mL injection 125 mg (125 mg Intravenous Given 05/16/17 1252)  diphenhydrAMINE (BENADRYL) injection 25 mg (25 mg Intravenous Given 05/16/17 1254)  famotidine (PEPCID) IVPB 20 mg premix (0 mg Intravenous Stopped 05/16/17 1343)     NEW OUTPATIENT MEDICATIONS STARTED DURING THIS VISIT:  New Prescriptions   DIPHENHYDRAMINE (BENADRYL) 25 MG TABLET    Take 1 tablet (25 mg total) by mouth every 6 (six) hours as needed for itching.   EPINEPHRINE 0.3 MG/0.3 ML IJ SOAJ INJECTION    Inject 0.3 mLs (0.3 mg total) into the muscle once for 1 dose.   FAMOTIDINE (PEPCID) 20 MG TABLET    Take 1 tablet (20 mg total) by mouth 2 (two) times daily.   PREDNISONE (DELTASONE) 20 MG TABLET    2 tabs po daily x 4 days    Note:  This note was prepared with assistance of Dragon voice recognition software. Occasional wrong-word or sound-a-like substitutions may have occurred due to the inherent limitations of voice recognition software.     Marily Memos, MD 05/16/17 [phone]

## 2017-05-16 NOTE — ED Notes (Signed)
ED Provider at bedside. 

## 2017-05-16 NOTE — ED Provider Notes (Signed)
4:38 PM Care assumed from Dr. Clayborne DanaMesner.  At time of transfer care, patient is awaiting reassessment at 430 to determine if patient is stable for discharge home after anaphylactic reaction and epinephrine administration.    Patient reports feeling completely normal has had no further symptoms.  No wheezing or shortness of breath.  No oropharyngeal symptoms.  Patient feels ready to go.  Patient will follow up with his PCP for further management and understood return precautions.  Patient had no other questions or concerns and was discharged in good condition.  Clinical Impression: 1. Anaphylaxis, initial encounter   2. Vasculitis (HCC)   3. Renal artery stenosis (HCC)     Disposition: Discharge  Condition: Good  I have discussed the results, Dx and Tx plan with the pt(& family if present). He/she/they expressed understanding and agree(s) with the plan. Discharge instructions discussed at great length. Strict return precautions discussed and pt &/or family have verbalized understanding of the instructions. No further questions at time of discharge.    New Prescriptions   DIPHENHYDRAMINE (BENADRYL) 25 MG TABLET    Take 1 tablet (25 mg total) by mouth every 6 (six) hours as needed for itching.   EPINEPHRINE 0.3 MG/0.3 ML IJ SOAJ INJECTION    Inject 0.3 mLs (0.3 mg total) into the muscle once for 1 dose.   FAMOTIDINE (PEPCID) 20 MG TABLET    Take 1 tablet (20 mg total) by mouth 2 (two) times daily.   PREDNISONE (DELTASONE) 20 MG TABLET    2 tabs po daily x 4 days    Follow Up: Soundra PilonBrake, Andrew R, FNP [address] [place] [address] [phone]  Schedule an appointment as soon as possible for a visit  vasculitis on ct scan  [facility] [address] [id number] [address]c [address] [place] [id number] [phone]  If symptoms worsen     Eyonna Sandstrom, Canary Brimhristopher J, MD 05/16/17 [phone]

## 2017-05-16 NOTE — Discharge Instructions (Addendum)
Please follow-up with your primary doctor.  If any symptoms change or worsen, please return to the nearest emergency department.

## 2017-05-16 NOTE — ED Notes (Signed)
Patient is resting comfortably, denies chest pain at present. ED MD informed

## 2017-05-16 NOTE — ED Notes (Signed)
Family at bedside. 

## 2017-05-16 NOTE — Telephone Encounter (Signed)
Informed patient that he would need to follow up with his PCP asap for his abdominal CTA. Patient was agreeable and stated he would follow up with his PCP.

## 2017-05-16 NOTE — ED Triage Notes (Signed)
Pt states he had CT in the building approx 20 min ago-was going to leave-started having itching all over-hives noted to face-no resp distress noted-steady gait

## 2017-05-16 NOTE — ED Notes (Signed)
Hives have resolved to face

## 2017-05-17 ENCOUNTER — Other Ambulatory Visit: Payer: Self-pay

## 2017-05-17 ENCOUNTER — Other Ambulatory Visit (HOSPITAL_BASED_OUTPATIENT_CLINIC_OR_DEPARTMENT_OTHER): Payer: 59

## 2017-05-17 ENCOUNTER — Ambulatory Visit: Payer: 59 | Admitting: Cardiology

## 2017-05-17 DIAGNOSIS — M546 Pain in thoracic spine: Secondary | ICD-10-CM | POA: Diagnosis not present

## 2017-05-17 DIAGNOSIS — I776 Arteritis, unspecified: Secondary | ICD-10-CM | POA: Diagnosis not present

## 2017-05-17 DIAGNOSIS — K219 Gastro-esophageal reflux disease without esophagitis: Secondary | ICD-10-CM | POA: Diagnosis not present

## 2017-05-18 ENCOUNTER — Encounter: Payer: Self-pay | Admitting: Cardiology

## 2017-05-18 ENCOUNTER — Ambulatory Visit (INDEPENDENT_AMBULATORY_CARE_PROVIDER_SITE_OTHER): Payer: 59 | Admitting: Cardiology

## 2017-05-18 VITALS — BP 138/82 | HR 70 | Ht 65.0 in | Wt 232.8 lb

## 2017-05-18 DIAGNOSIS — I10 Essential (primary) hypertension: Secondary | ICD-10-CM

## 2017-05-18 NOTE — Patient Instructions (Signed)
Medication Instructions:  Your physician recommends that you continue on your current medications as directed. Please refer to the Current Medication list given to you today.   Labwork: None  Testing/Procedures: None  Follow-Up: Your physician recommends that you schedule a follow-up appointment in: 3 months  Any Other Special Instructions Will Be Listed Below (If Applicable).     If you need a refill on your cardiac medications before your next appointment, please call your pharmacy.   

## 2017-05-18 NOTE — Progress Notes (Signed)
Cardiology Office Note:    Date:  05/18/2017   ID:  Luke Wade, DOB December 18, 1980, MRN [id number]  PCP:  Soundra Pilon, FNP  Cardiologist:  Garwin Brothers, MD   Referring MD: Soundra Pilon, FNP    ASSESSMENT:    1. Essential hypertension    PLAN:    In order of problems listed above:  1. I discussed my findings with the patient at extensive length.  His blood pressure stable.  I spoke with his primary care provider yesterday extensively about the CT scan.  In view of the 6 significant vasculitis I mentioned that he be referred to a tertiary center and they agreed and are in the process of doing this.  He has had blood work yesterday so I will not repeat any.  I reassured him about findings from a blood pressure standpoint.  I am very pleased to know that he quit smoking the last time he saw me.  I told him never to go back to smoking again and revisited the risks.  He will be seen in follow-up appointment in 3 months or earlier if he has any concerns.   Medication Adjustments/Labs and Tests Ordered: Current medicines are reviewed at length with the patient today.  Concerns regarding medicines are outlined above.  No orders of the defined types were placed in this encounter.  No orders of the defined types were placed in this encounter.    Chief Complaint  Patient presents with  . Follow-up     History of Present Illness:    Luke Wade is a 37 y.o. male.  The patient was evaluated for essential hypertension and ultrasound of the abdomen revealed significant issues of the renal arteries and the mesenteric arteries.  Subsequently this has been significantly abnormal suggesting of her vasculitis kind of any involvement of the abdomen and its vasculature.  Patient denies any chest pain orthopnea PND.  He does not explain of any symptoms of claudication or leg pain on ambulation.  He is completely asymptomatic and appears fine. I discussed the findings of the CT  Past  Medical History:  Diagnosis Date  . Abnormal myocardial perfusion study 05/02/2017  . Chest pain 04/16/2017  . Cigarette smoker 04/16/2017  . Hypertension 04/16/2017  . Overweight 04/16/2017    Past Surgical History:  Procedure Laterality Date  . LEFT HEART CATH AND CORONARY ANGIOGRAPHY N/A 05/03/2017   Procedure: LEFT HEART CATH AND CORONARY ANGIOGRAPHY;  Surgeon: Marykay Lex, MD;  Location: [facility] INVASIVE CV LAB;  Service: Cardiovascular;  Laterality: N/A;  . NO PAST SURGERIES      Current Medications: Current Meds  Medication Sig  . BANOPHEN 25 MG capsule   . diphenhydrAMINE (BENADRYL) 25 MG tablet Take 1 tablet (25 mg total) by mouth every 6 (six) hours as needed for itching.  Marland Kitchen EPINEPHrine 0.3 mg/0.3 mL IJ SOAJ injection   . esomeprazole (NEXIUM) 20 MG capsule Take 20 mg by mouth daily at 12 noon.  . famotidine (PEPCID) 20 MG tablet Take 1 tablet (20 mg total) by mouth 2 (two) times daily.  . predniSONE (DELTASONE) 20 MG tablet 2 tabs po daily x 4 days  . spironolactone-hydrochlorothiazide (ALDACTAZIDE) 25-25 MG tablet Take 1 tablet by mouth daily.     Allergies:   Contrast media [iodinated diagnostic agents]   Social History   Socioeconomic History  . Marital status: Married    Spouse name: Not on file  . Number of children: Not on  file  . Years of education: Not on file  . Highest education level: Not on file  Occupational History  . Not on file  Social Needs  . Financial resource strain: Not on file  . Food insecurity:    Worry: Not on file    Inability: Not on file  . Transportation needs:    Medical: Not on file    Non-medical: Not on file  Tobacco Use  . Smoking status: Former Smoker    Packs/day: 0.25    Types: Cigarettes  . Smokeless tobacco: Never Used  Substance and Sexual Activity  . Alcohol use: Yes    Comment: weekends   . Drug use: Not Currently    Types: Marijuana  . Sexual activity: Not on file  Lifestyle  . Physical activity:    Days per  week: Not on file    Minutes per session: Not on file  . Stress: Not on file  Relationships  . Social connections:    Talks on phone: Not on file    Gets together: Not on file    Attends religious service: Not on file    Active member of club or organization: Not on file    Attends meetings of clubs or organizations: Not on file    Relationship status: Not on file  Other Topics Concern  . Not on file  Social History Narrative  . Not on file     Family History: The patient's family history includes Cancer in his father and mother.  ROS:   Please see the history of present illness.    All other systems reviewed and are negative.  EKGs/Labs/Other Studies Reviewed:    The following studies were reviewed today: CT scan report was discussed with the patient at length   Recent Labs: 05/03/2017: ALT 32; B Natriuretic Peptide 36.0; BUN 15; Creatinine, Ser 1.10; Hemoglobin 14.2; Magnesium 2.2; Platelets 247; Potassium 3.8; Sodium 135; TSH 3.742  Recent Lipid Panel    Component Value Date/Time   CHOL 184 05/03/2017 0701   TRIG 267 (H) 05/03/2017 0701   HDL 47 05/03/2017 0701   CHOLHDL 3.9 05/03/2017 0701   VLDL 53 (H) 05/03/2017 0701   LDLCALC 84 05/03/2017 0701    Physical Exam:    VS:  BP 138/82 (BP Location: Right Arm, Patient Position: Sitting, Cuff Size: Normal)   Pulse 70   Ht 5\' 5"  (1.651 m)   Wt 232 lb 12.8 oz (105.6 kg)   SpO2 99%   BMI 38.74 kg/m     Wt Readings from Last 3 Encounters:  05/18/17 232 lb 12.8 oz (105.6 kg)  05/16/17 233 lb (105.7 kg)  05/03/17 228 lb 2.8 oz (103.5 kg)     GEN: Patient is in no acute distress HEENT: Normal NECK: No JVD; No carotid bruits LYMPHATICS: No lymphadenopathy CARDIAC: Hear sounds regular, 2/6 systolic murmur at the apex. RESPIRATORY:  Clear to auscultation without rales, wheezing or rhonchi  ABDOMEN: Soft, non-tender, non-distended MUSCULOSKELETAL:  No edema; No deformity  SKIN: Warm and dry NEUROLOGIC:  Alert  and oriented x 3 PSYCHIATRIC:  Normal affect   Signed, Garwin Brothersajan R Revankar, MD  05/18/2017 12:05 PM    [facility]

## 2017-05-21 ENCOUNTER — Telehealth: Payer: Self-pay

## 2017-05-21 NOTE — Telephone Encounter (Signed)
Called patient to inform him that his FMLA paperwork was at the office and would need his signature when he is able to come.

## 2017-05-23 ENCOUNTER — Telehealth: Payer: Self-pay | Admitting: Cardiology

## 2017-05-23 NOTE — Telephone Encounter (Signed)
Left voicemail to return call. 

## 2017-05-23 NOTE — Telephone Encounter (Signed)
Please call [place] at number [facility]. He says it is urgent.

## 2017-05-31 DIAGNOSIS — N135 Crossing vessel and stricture of ureter without hydronephrosis: Secondary | ICD-10-CM | POA: Diagnosis not present

## 2017-05-31 DIAGNOSIS — E669 Obesity, unspecified: Secondary | ICD-10-CM | POA: Diagnosis not present

## 2017-05-31 DIAGNOSIS — R5383 Other fatigue: Secondary | ICD-10-CM | POA: Diagnosis not present

## 2017-05-31 DIAGNOSIS — Z6838 Body mass index (BMI) 38.0-38.9, adult: Secondary | ICD-10-CM | POA: Diagnosis not present

## 2017-05-31 DIAGNOSIS — I776 Arteritis, unspecified: Secondary | ICD-10-CM | POA: Diagnosis not present

## 2017-06-18 ENCOUNTER — Telehealth: Payer: Self-pay | Admitting: Cardiology

## 2017-06-18 ENCOUNTER — Other Ambulatory Visit: Payer: Self-pay

## 2017-06-18 DIAGNOSIS — F330 Major depressive disorder, recurrent, mild: Secondary | ICD-10-CM | POA: Diagnosis not present

## 2017-06-18 DIAGNOSIS — F411 Generalized anxiety disorder: Secondary | ICD-10-CM | POA: Diagnosis not present

## 2017-06-18 MED ORDER — SPIRONOLACTONE-HCTZ 25-25 MG PO TABS: 1.0000 | ORAL_TABLET | Freq: Every day | ORAL | 2 refills | Status: DC

## 2017-06-18 NOTE — Telephone Encounter (Signed)
Please call in refill to [facility] on [address] for spironolactone if doctor wants him to stay on it.

## 2017-06-18 NOTE — Telephone Encounter (Signed)
Med refill has been sent. 

## 2017-06-20 MED FILL — SPIRONOLACTONE-HCTZ 25-25 T: 25-25 | 90 days supply | Qty: 90 | Fill #0 | Status: TO

## 2017-07-05 ENCOUNTER — Encounter (HOSPITAL_COMMUNITY): Payer: Self-pay

## 2017-07-05 ENCOUNTER — Other Ambulatory Visit: Payer: Self-pay

## 2017-07-05 ENCOUNTER — Emergency Department (HOSPITAL_COMMUNITY)
Admission: EM | Admit: 2017-07-05 | Discharge: 2017-07-06 | Disposition: A | Discharge status: Home / Self Care | Payer: 59 | Attending: Emergency Medicine | Admitting: Emergency Medicine

## 2017-07-05 ENCOUNTER — Emergency Department (HOSPITAL_COMMUNITY): Payer: 59

## 2017-07-05 DIAGNOSIS — M546 Pain in thoracic spine: Secondary | ICD-10-CM | POA: Diagnosis not present

## 2017-07-05 DIAGNOSIS — R079 Chest pain, unspecified: Secondary | ICD-10-CM | POA: Diagnosis not present

## 2017-07-05 DIAGNOSIS — Z87891 Personal history of nicotine dependence: Secondary | ICD-10-CM | POA: Insufficient documentation

## 2017-07-05 DIAGNOSIS — R0902 Hypoxemia: Secondary | ICD-10-CM | POA: Diagnosis not present

## 2017-07-05 DIAGNOSIS — R0789 Other chest pain: Secondary | ICD-10-CM | POA: Diagnosis not present

## 2017-07-05 DIAGNOSIS — Z7982 Long term (current) use of aspirin: Secondary | ICD-10-CM | POA: Insufficient documentation

## 2017-07-05 DIAGNOSIS — I10 Essential (primary) hypertension: Secondary | ICD-10-CM | POA: Diagnosis not present

## 2017-07-05 DIAGNOSIS — R200 Anesthesia of skin: Secondary | ICD-10-CM | POA: Insufficient documentation

## 2017-07-05 DIAGNOSIS — Z79899 Other long term (current) drug therapy: Secondary | ICD-10-CM | POA: Diagnosis not present

## 2017-07-05 DIAGNOSIS — Z8679 Personal history of other diseases of the circulatory system: Secondary | ICD-10-CM | POA: Insufficient documentation

## 2017-07-05 LAB — CBC
HEMATOCRIT: 42.3 % (ref 39.0–52.0)
Hemoglobin: 13.6 g/dL (ref 13.0–17.0)
MCH: 25.1 pg — AB (ref 26.0–34.0)
MCHC: 32.2 g/dL (ref 30.0–36.0)
MCV: 78.2 fL (ref 78.0–100.0)
Platelets: 250 10*3/uL (ref 150–400)
RBC: 5.41 MIL/uL (ref 4.22–5.81)
RDW: 14.6 % (ref 11.5–15.5)
WBC: 8.6 10*3/uL (ref 4.0–10.5)

## 2017-07-05 LAB — BASIC METABOLIC PANEL
Anion gap: 7 (ref 5–15)
BUN: 18 mg/dL (ref 6–20)
CHLORIDE: 104 mmol/L (ref 101–111)
CO2: 27 mmol/L (ref 22–32)
Calcium: 9 mg/dL (ref 8.9–10.3)
Creatinine, Ser: 1.01 mg/dL (ref 0.61–1.24)
GFR calc Af Amer: >60 mL/min (ref >60)
GLUCOSE: 118 mg/dL — AB (ref 65–99)
POTASSIUM: 4.1 mmol/L (ref 3.5–5.1)
Sodium: 138 mmol/L (ref 135–145)

## 2017-07-05 LAB — I-STAT TROPONIN, ED: Troponin i, poc: 0.01 ng/mL (ref 0.00–0.08)

## 2017-07-05 IMAGING — DX DG CHEST 2V
2 series · 2 of 2 positions shown · non-contrast
Comparison: [DATE]

CLINICAL DATA: Chest pain

EXAM:
CHEST - 2 VIEW

[chest pa]
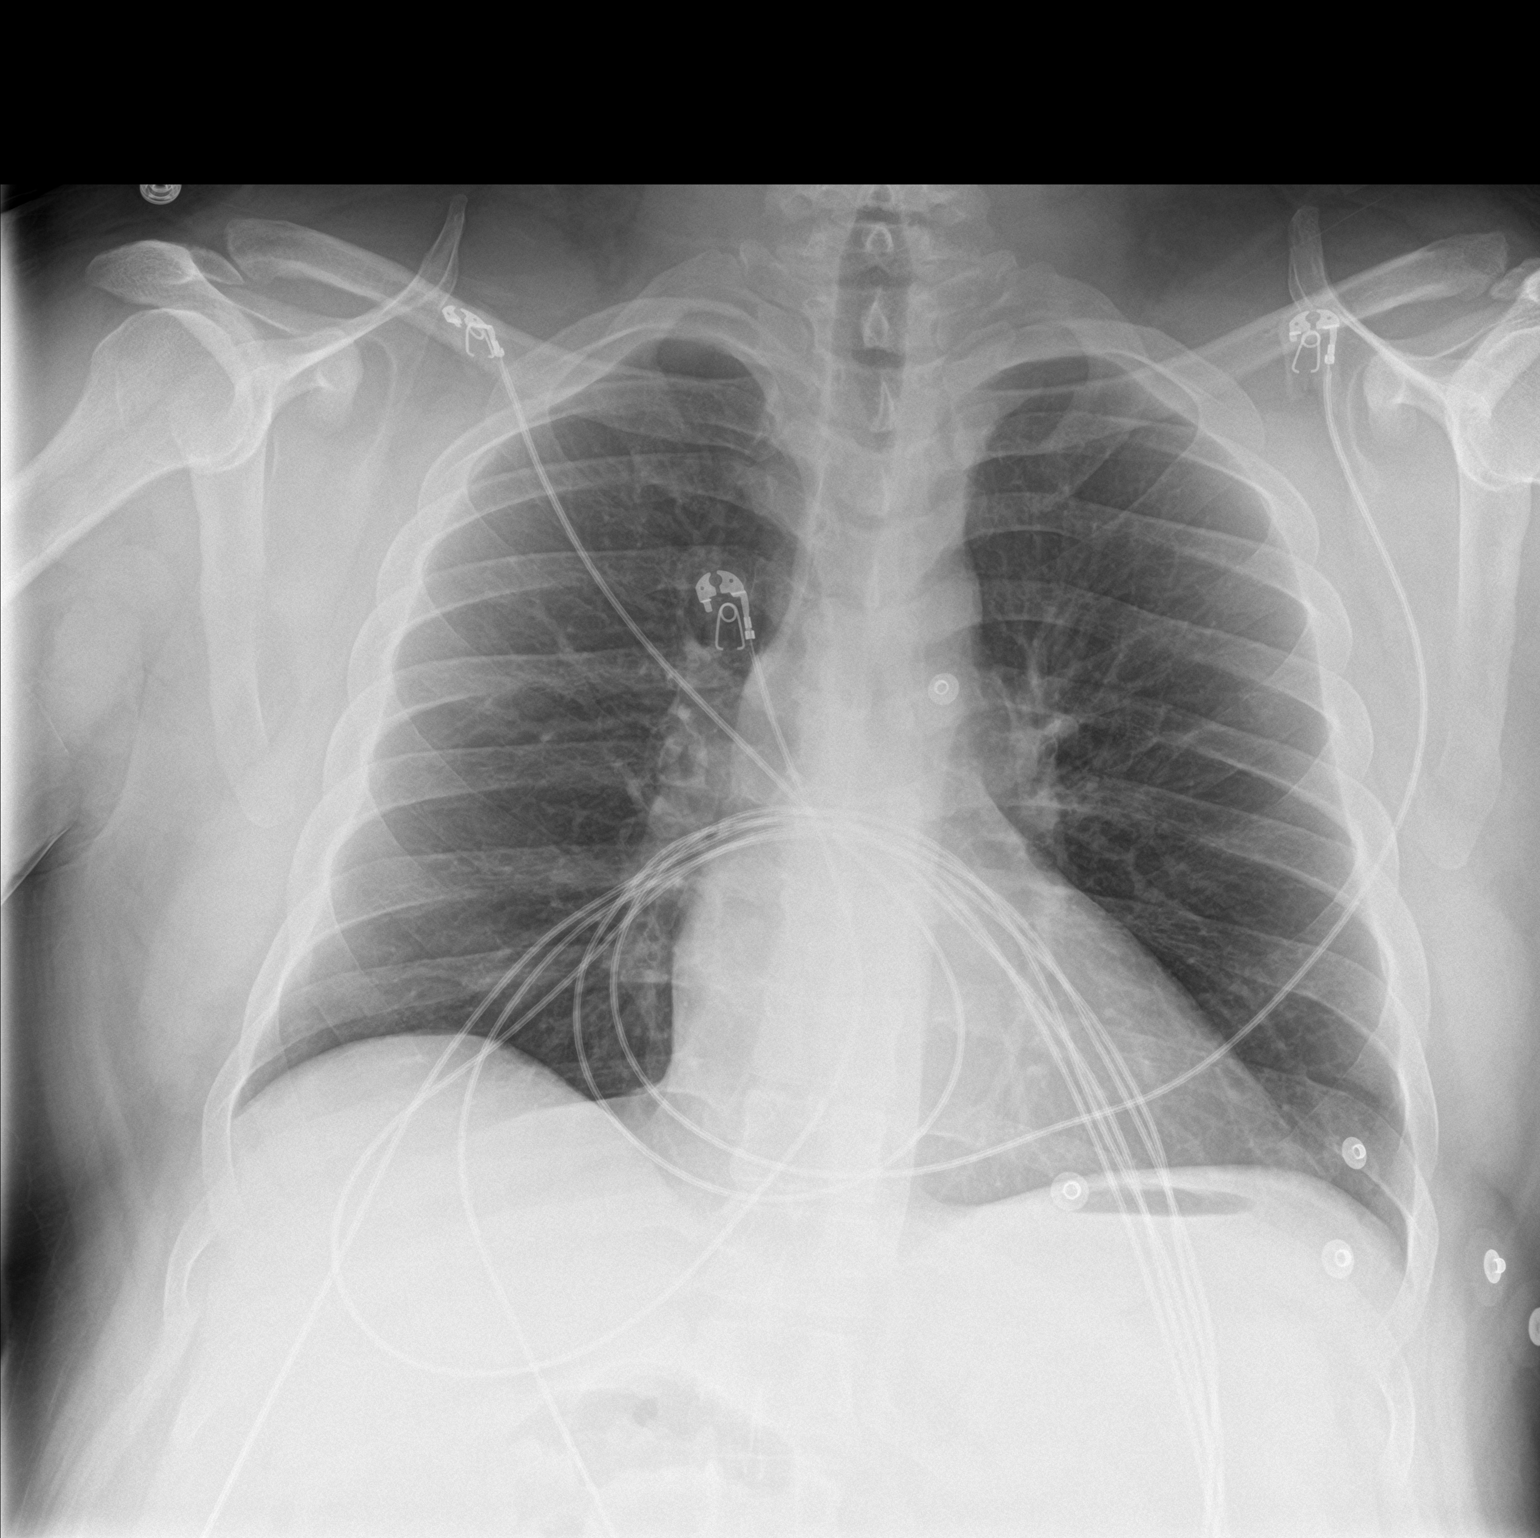

[chest lat]
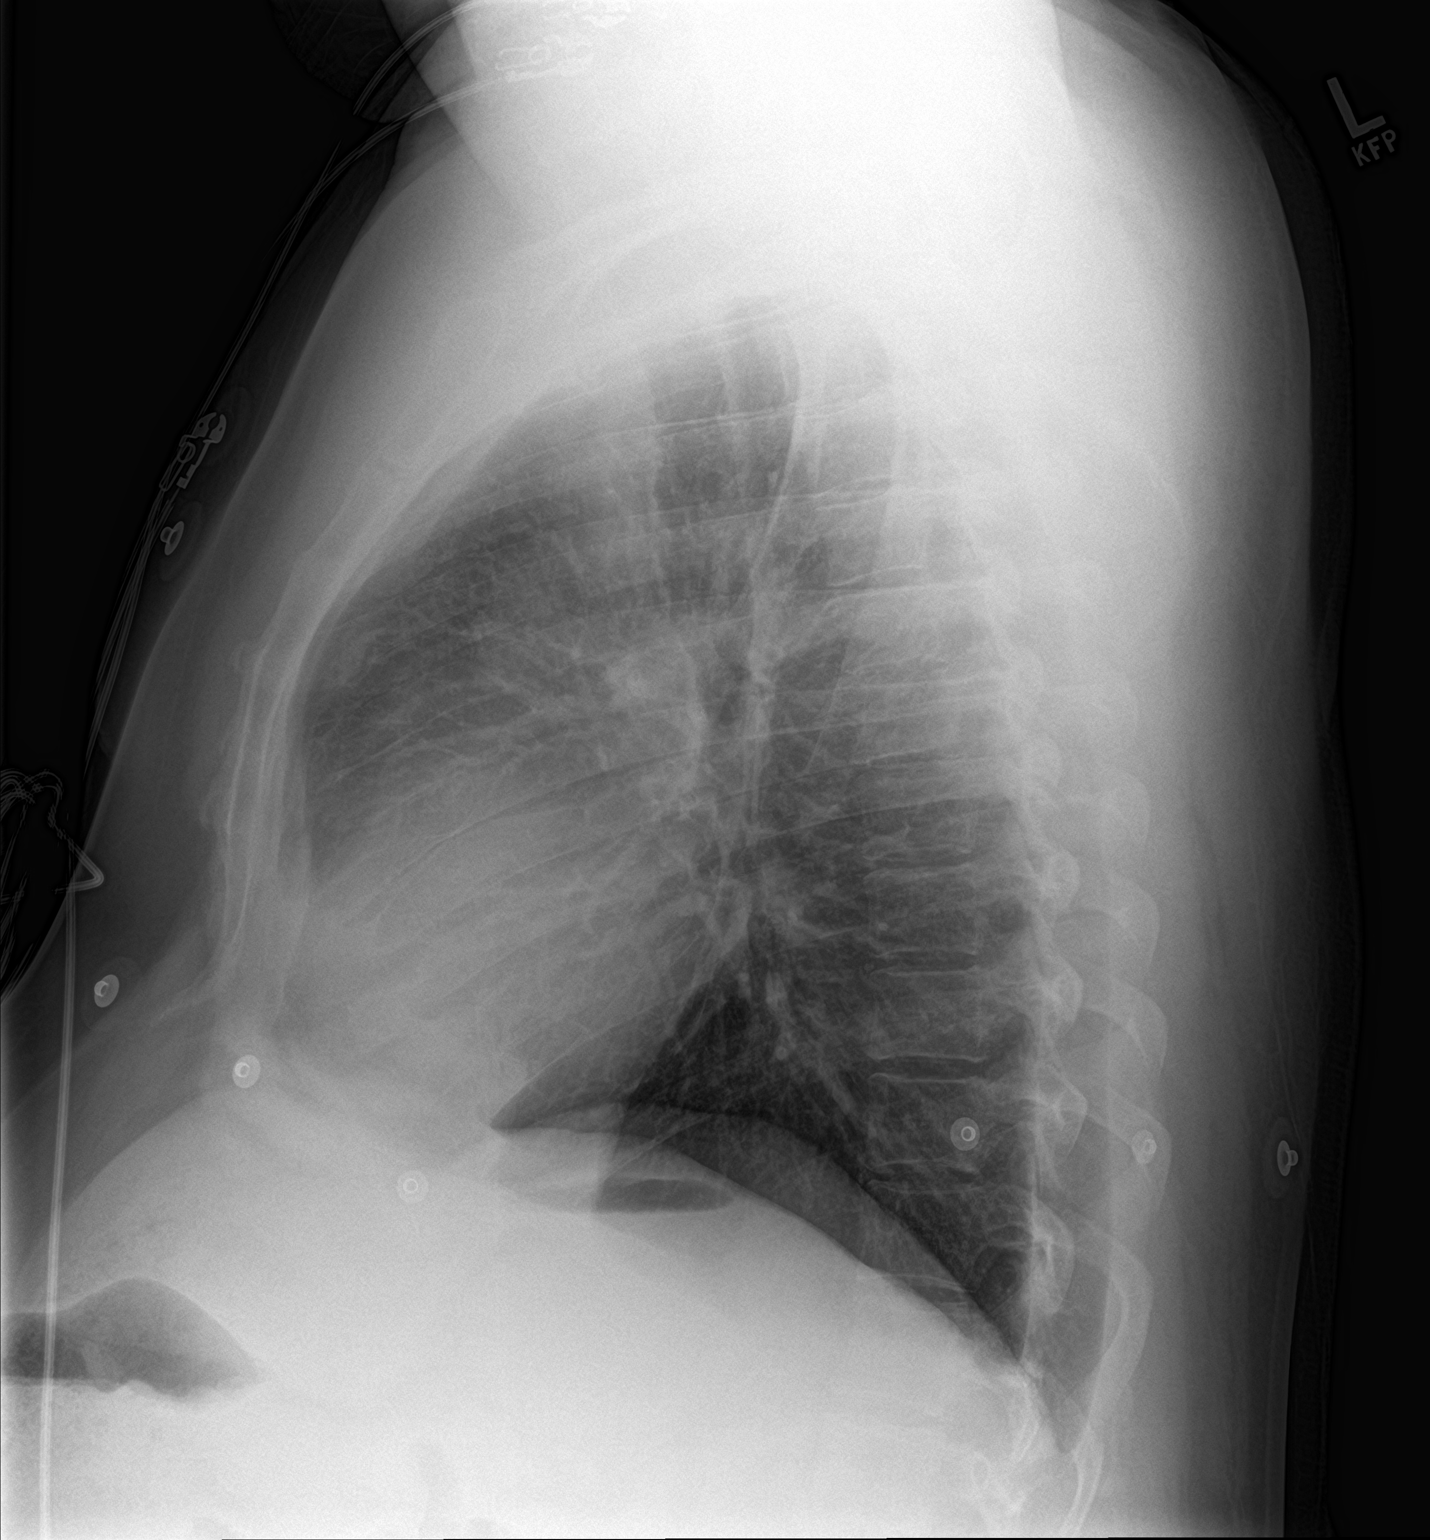

[2 of 2 positions shown; findings below may reference images not displayed]

FINDINGS: Lungs are clear.  No pleural effusion or pneumothorax.

The heart is normal in size.

Visualized osseous structures are within normal limits.
IMPRESSION: Normal chest radiographs.

## 2017-07-05 NOTE — ED Notes (Signed)
DG chest clicked off in error, not yet completed.

## 2017-07-05 NOTE — ED Triage Notes (Signed)
Per [facility], pt arrives from home with complaints of centralized CP and bilateral leg numbness starting after dinner at 1930. Pt reports initial CP sharp and 10/10 radiating to back. Pt received 324 ASA and 2 NTG. Pt now states CP 4/10 and denies any more leg numbness. Hx of cardiac cath in April. Initial BP 210/110, and bp after NTG 139/74. Pt had similar episode last night. Pt denies SOB, N/V, or diaphoresis.

## 2017-07-05 NOTE — ED Notes (Signed)
Patient transported to X-ray 

## 2017-07-05 NOTE — ED Notes (Signed)
ED Provider at bedside. 

## 2017-07-06 MED ORDER — HYDROCODONE-ACETAMINOPHEN 5-325 MG PO TABS: 1.0000 | ORAL_TABLET | Freq: Four times a day (QID) | ORAL | 0 refills | Status: DC | PRN

## 2017-07-06 NOTE — Discharge Instructions (Addendum)
Hydrocodone is prescribed as needed for pain.  Follow-up with your rheumatologist in the next few days, and return to the ER if symptoms significantly worsen or change.

## 2017-07-06 NOTE — ED Provider Notes (Signed)
[facility] EMERGENCY DEPARTMENT Provider Note   CSN: [id number] Arrival date & time: 07/05/17  2225     History   Chief Complaint Chief Complaint  Patient presents with  . Chest Pain    HPI Luke Wade is a 37 y.o. male.  Patient is a 37 year old male with past medical history of hypertension presenting for evaluation of chest discomfort.  This started this evening at approximately 730 while he was lying down after eating dinner.  The pain is sharp in nature and radiates to his back.  He also describes his legs becoming "numb" during this particular episode.  His symptoms are improving somewhat, however are still present.  He has had similar episodes in the past and has been seen for this.  He tells me he has these episodes every evening and this is been ongoing for the past several weeks.  Upon reviewing the medical record, he is recently undergone a heart catheterization which was revealing of normal coronary arteries.  He also had a CT scan of the vasculature of his abdomen.  This revealed what appears to be vasculitis of the aorta and branches of the aorta.  This test was ordered by his primary doctor.  The history is provided by the patient.  Chest Pain   This is a recurrent problem. Episode onset: 8:30. The problem occurs constantly. The problem has been gradually improving. Associated with: unknown. The pain is moderate. The quality of the pain is described as sharp. The pain radiates to the upper back. Associated symptoms include back pain. Pertinent negatives include no nausea and no shortness of breath. He has tried nothing for the symptoms. The treatment provided no relief.    Past Medical History:  Diagnosis Date  . Abnormal myocardial perfusion study 05/02/2017  . Chest pain 04/16/2017  . Cigarette smoker 04/16/2017  . Hypertension 04/16/2017  . Overweight 04/16/2017    Patient Active Problem List   Diagnosis Date Noted  . Abnormal myocardial  perfusion study 05/02/2017  . Chest pain 04/16/2017  . Hypertension 04/16/2017  . Overweight 04/16/2017    Past Surgical History:  Procedure Laterality Date  . LEFT HEART CATH AND CORONARY ANGIOGRAPHY N/A 05/03/2017   Procedure: LEFT HEART CATH AND CORONARY ANGIOGRAPHY;  Surgeon: Marykay Lex, MD;  Location: [facility] INVASIVE CV LAB;  Service: Cardiovascular;  Laterality: N/A;  . NO PAST SURGERIES          Home Medications    Prior to Admission medications   Medication Sig Start Date End Date Taking? Authorizing Provider  aspirin EC 81 MG tablet Take 81 mg by mouth daily.   Yes [provider]  EPINEPHrine 0.3 mg/0.3 mL IJ SOAJ injection Inject 0.3 mg into the muscle as needed (for allergic reaction).  05/16/17  Yes [provider]  metoprolol tartrate (LOPRESSOR) 50 MG tablet Take 50 mg by mouth 2 (two) times daily. 05/30/17  Yes [provider]  spironolactone-hydrochlorothiazide (ALDACTAZIDE) 25-25 MG tablet Take 1 tablet by mouth daily. 06/18/17  Yes Revankar, Aundra Dubin, MD  diphenhydrAMINE (BENADRYL) 25 MG tablet Take 1 tablet (25 mg total) by mouth every 6 (six) hours as needed for itching. Patient not taking: Reported on 07/05/2017 05/16/17   Mesner, Barbara Cower, MD  famotidine (PEPCID) 20 MG tablet Take 1 tablet (20 mg total) by mouth 2 (two) times daily. Patient not taking: Reported on 07/05/2017 05/16/17   Mesner, Barbara Cower, MD  predniSONE (DELTASONE) 20 MG tablet 2 tabs po daily x  4 days Patient not taking: Reported on 07/05/2017 05/16/17   Mesner, Barbara Cower, MD    Family History Family History  Problem Relation Age of Onset  . Cancer Mother   . Cancer Father     Social History Social History   Tobacco Use  . Smoking status: Former Smoker    Packs/day: 0.25    Types: Cigarettes  . Smokeless tobacco: Never Used  Substance Use Topics  . Alcohol use: Yes    Comment: occ  . Drug use: Not Currently    Types: Marijuana     Allergies   Contrast media  [iodinated diagnostic agents]   Review of Systems Review of Systems  Respiratory: Negative for shortness of breath.   Cardiovascular: Positive for chest pain.  Gastrointestinal: Negative for nausea.  Musculoskeletal: Positive for back pain.  All other systems reviewed and are negative.    Physical Exam Updated Vital Signs BP (!) 156/79   Pulse 90   Temp 98.3 F (36.8 C) (Oral)   Resp (!) 21   Ht 5\' 5"  (1.651 m)   Wt 108.9 kg (240 lb)   SpO2 98%   BMI 39.94 kg/m   Physical Exam  Constitutional: He is oriented to person, place, and time. He appears well-developed and well-nourished. No distress.  HENT:  Head: Normocephalic and atraumatic.  Mouth/Throat: Oropharynx is clear and moist.  Neck: Normal range of motion. Neck supple.  Cardiovascular: Normal rate and regular rhythm. Exam reveals no friction rub.  No murmur heard. Pulmonary/Chest: Effort normal and breath sounds normal. No respiratory distress. He has no wheezes. He has no rales.  Abdominal: Soft. Bowel sounds are normal. He exhibits no distension. There is no tenderness.  Musculoskeletal: Normal range of motion. He exhibits no edema.       Right lower leg: Normal. He exhibits no tenderness and no edema.       Left lower leg: Normal. He exhibits no tenderness and no edema.  Neurological: He is alert and oriented to person, place, and time. Coordination normal.  Strength is 5 out of 5 in both lower extremities.  Sensation is intact throughout.  DTRs are 1+ and symmetrical in the lower extremities.  Skin: Skin is warm and dry. He is not diaphoretic.  Nursing note and vitals reviewed.    ED Treatments / Results  Labs (all labs ordered are listed, but only abnormal results are displayed) Labs Reviewed  BASIC METABOLIC PANEL - Abnormal; Notable for the following components:      Result Value   Glucose, Bld 118 (*)    All other components within normal limits  CBC - Abnormal; Notable for the following components:    MCH 25.1 (*)    All other components within normal limits  I-STAT TROPONIN, ED    EKG EKG Interpretation  Date/Time:  Thursday Jul 05 2017 22:41:12 EDT Ventricular Rate:  92 PR Interval:  142 QRS Duration: 94 QT Interval:  358 QTC Calculation: 442 R Axis:   62 Text Interpretation:   Poor data quality, interpretation may be adversely affected Normal sinus rhythm Left ventricular hypertrophy with repolarization abnormality Abnormal ECG No significant change since last tracing Confirmed by Bary Castilla ([id number]) on 07/05/2017 10:46:56 PM   Radiology Dg Chest 2 View  Result Date: 07/05/2017 CLINICAL DATA:  Chest pain EXAM: CHEST - 2 VIEW COMPARISON:  05/02/2017 FINDINGS: Lungs are clear.  No pleural effusion or pneumothorax. The heart is normal in size. Visualized osseous structures are within normal limits. IMPRESSION:  Normal chest radiographs. Electronically Signed   By: Charline BillsSriyesh  Krishnan M.D.   On: 07/05/2017 23:10    Procedures Procedures (including critical care time)  Medications Ordered in ED Medications - No data to display   Initial Impression / Assessment and Plan / ED Course  I have reviewed the triage vital signs and the nursing notes.  Pertinent labs & imaging results that were available during my care of the patient were reviewed by me and considered in my medical decision making (see chart for details).  Patient presenting here with complaints of chest pain and leg numbness.  His symptoms have significantly improved without intervention while in the ER.  His work-up reveals no evidence for a cardiac etiology and his laboratory studies are otherwise reassuring.  Upon reviewing his record, it appears as though he has some sort of vasculitis involving his aorta and several arteries originating from the aorta.  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  I suspect that this is the cause of his symptoms.  He has been seen by rheumatology for this and prescribed  blood pressure medication, but no other specific treatment was recommended.  At this point, I see nothing emergent and no indication for admission.  He will be prescribed medication for his pain and is to follow-up as needed.  Final Clinical Impressions(s) / ED Diagnoses   Final diagnoses:  None    ED Discharge Orders    None       Geoffery Lyonselo, Tiani Stanbery, MD 07/06/17 [phone]

## 2017-07-06 NOTE — ED Notes (Signed)
ED Provider at bedside. 

## 2018-08-05 DIAGNOSIS — K219 Gastro-esophageal reflux disease without esophagitis: Secondary | ICD-10-CM

## 2018-08-05 HISTORY — DX: Gastro-esophageal reflux disease without esophagitis: K21.9

## 2019-04-28 ENCOUNTER — Emergency Department (HOSPITAL_COMMUNITY)
Admission: EM | Admit: 2019-04-28 | Discharge: 2019-04-28 | Disposition: A | Discharge status: Home / Self Care | Payer: Self-pay | Attending: Emergency Medicine | Admitting: Emergency Medicine

## 2019-04-28 ENCOUNTER — Emergency Department (HOSPITAL_COMMUNITY): Payer: Self-pay

## 2019-04-28 ENCOUNTER — Other Ambulatory Visit: Payer: Self-pay

## 2019-04-28 ENCOUNTER — Encounter (HOSPITAL_COMMUNITY): Payer: Self-pay | Admitting: Emergency Medicine

## 2019-04-28 DIAGNOSIS — M545 Low back pain: Secondary | ICD-10-CM | POA: Insufficient documentation

## 2019-04-28 DIAGNOSIS — R3915 Urgency of urination: Secondary | ICD-10-CM | POA: Insufficient documentation

## 2019-04-28 DIAGNOSIS — R200 Anesthesia of skin: Secondary | ICD-10-CM | POA: Insufficient documentation

## 2019-04-28 DIAGNOSIS — Z79899 Other long term (current) drug therapy: Secondary | ICD-10-CM | POA: Insufficient documentation

## 2019-04-28 DIAGNOSIS — I10 Essential (primary) hypertension: Secondary | ICD-10-CM | POA: Insufficient documentation

## 2019-04-28 DIAGNOSIS — R1084 Generalized abdominal pain: Secondary | ICD-10-CM | POA: Insufficient documentation

## 2019-04-28 LAB — URINALYSIS, ROUTINE W REFLEX MICROSCOPIC
Bilirubin Urine: NEGATIVE
Glucose, UA: NEGATIVE mg/dL
Hgb urine dipstick: NEGATIVE
Ketones, ur: NEGATIVE mg/dL
Leukocytes,Ua: NEGATIVE
Nitrite: NEGATIVE
Protein, ur: NEGATIVE mg/dL
Specific Gravity, Urine: 1.028 (ref 1.005–1.030)
pH: 5 (ref 5.0–8.0)

## 2019-04-28 LAB — CBC
HCT: 49.5 % (ref 39.0–52.0)
Hemoglobin: 15.5 g/dL (ref 13.0–17.0)
MCH: 25.3 pg — ABNORMAL LOW (ref 26.0–34.0)
MCHC: 31.3 g/dL (ref 30.0–36.0)
MCV: 80.8 fL (ref 80.0–100.0)
Platelets: 288 10*3/uL (ref 150–400)
RBC: 6.13 MIL/uL — ABNORMAL HIGH (ref 4.22–5.81)
RDW: 15.9 % — ABNORMAL HIGH (ref 11.5–15.5)
WBC: 7.3 10*3/uL (ref 4.0–10.5)
nRBC: 0 % (ref 0.0–0.2)

## 2019-04-28 LAB — COMPREHENSIVE METABOLIC PANEL
ALT: 28 U/L (ref 0–44)
AST: 21 U/L (ref 15–41)
Albumin: 4.1 g/dL (ref 3.5–5.0)
Alkaline Phosphatase: 53 U/L (ref 38–126)
Anion gap: 8 (ref 5–15)
BUN: 17 mg/dL (ref 6–20)
CO2: 22 mmol/L (ref 22–32)
Calcium: 9 mg/dL (ref 8.9–10.3)
Chloride: 106 mmol/L (ref 98–111)
Creatinine, Ser: 1.12 mg/dL (ref 0.61–1.24)
GFR calc Af Amer: >60 mL/min (ref >60)
GFR calc non Af Amer: >60 mL/min (ref >60)
Glucose, Bld: 99 mg/dL (ref 70–99)
Potassium: 4.6 mmol/L (ref 3.5–5.1)
Sodium: 136 mmol/L (ref 135–145)
Total Bilirubin: 0.5 mg/dL (ref 0.3–1.2)
Total Protein: 7.5 g/dL (ref 6.5–8.1)

## 2019-04-28 IMAGING — CT CT L SPINE W/O CM
3 series · 14 of 35 positions shown, 17 images · non-contrast
Comparison: None.

CLINICAL DATA: Back and left leg pain.

EXAM:
CT LUMBAR SPINE WITHOUT CONTRAST
TECHNIQUE: Multidetector CT imaging of the lumbar spine was performed without
intravenous contrast administration. Multiplanar CT image
reconstructions were also generated.

[Series 9: cor · coronal · 0.39mm/px · 3 of 100 slices shown]
[im 20/100  bone]
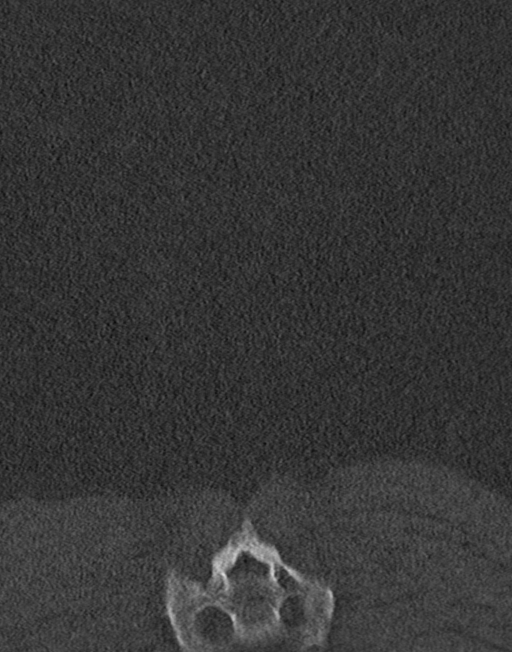
[im 40/100  bone]
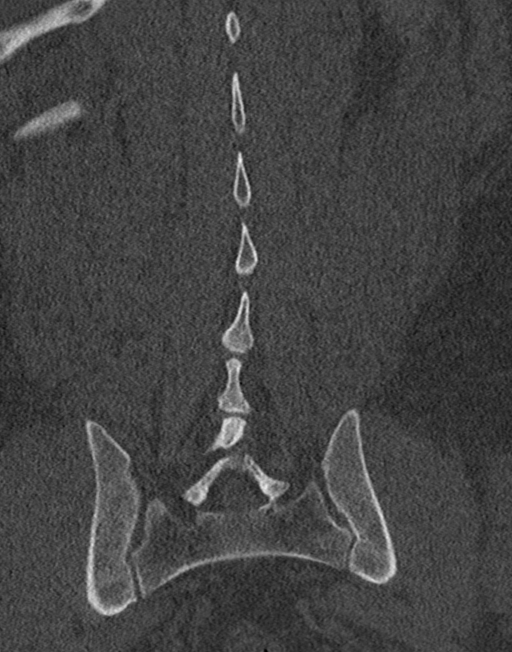
[im 60/100  bone]
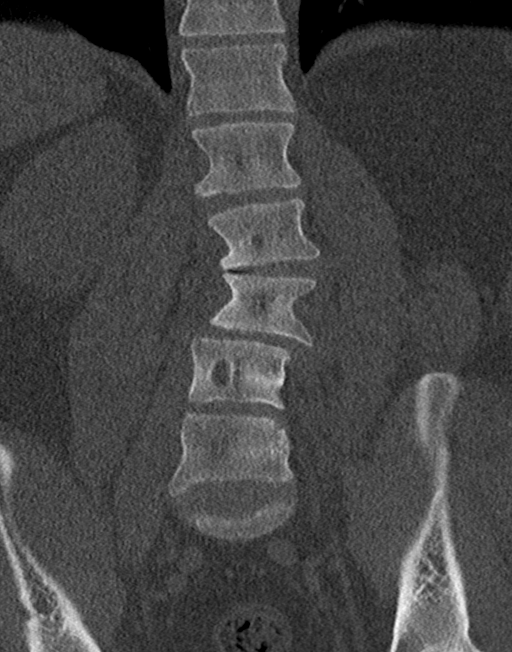

[Series 10: sag · sagittal · 0.39mm/px · 5 of 100 slices shown, 6 images]
[im 34/100  bone]
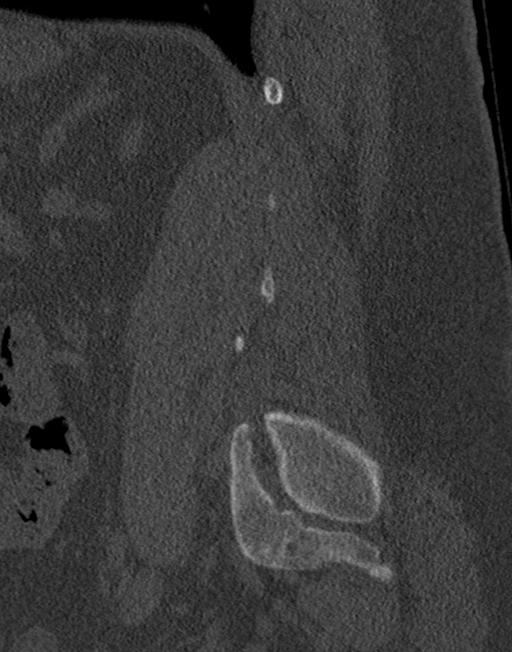
[im 42/100  bone]
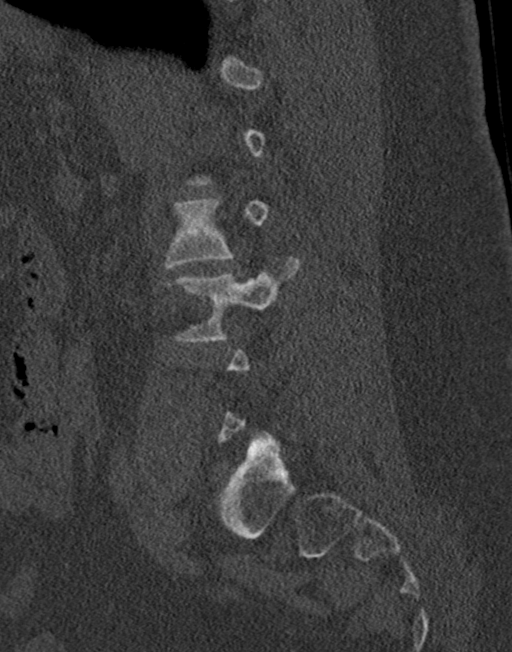
[im 50/100  soft-tissue]
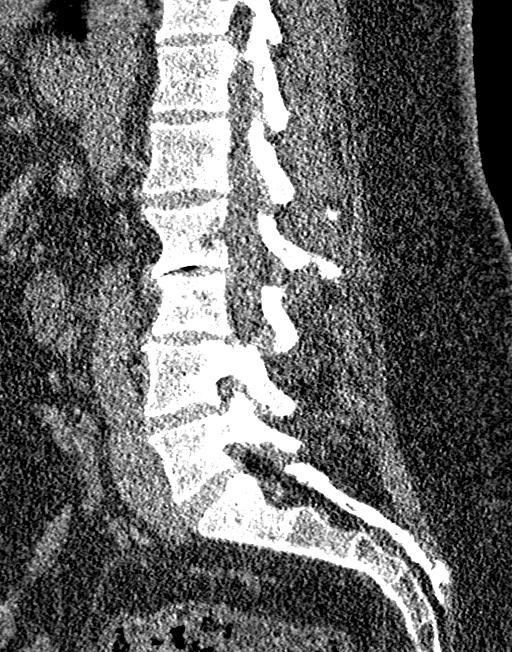
[im 50/100  bone]
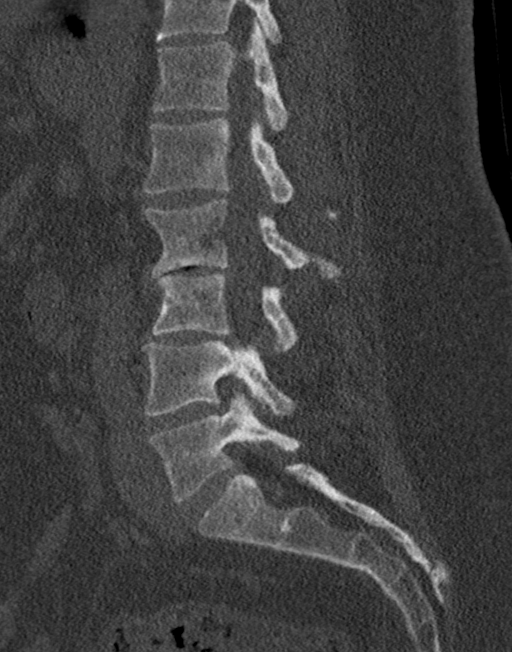
[im 58/100  bone]
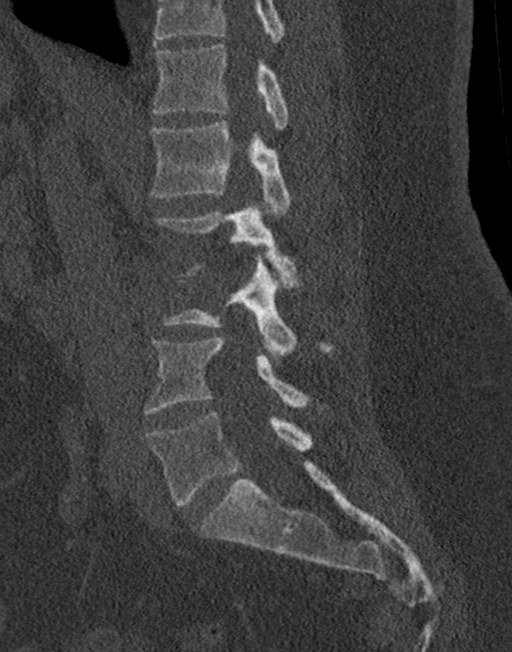
[im 67/100  bone]
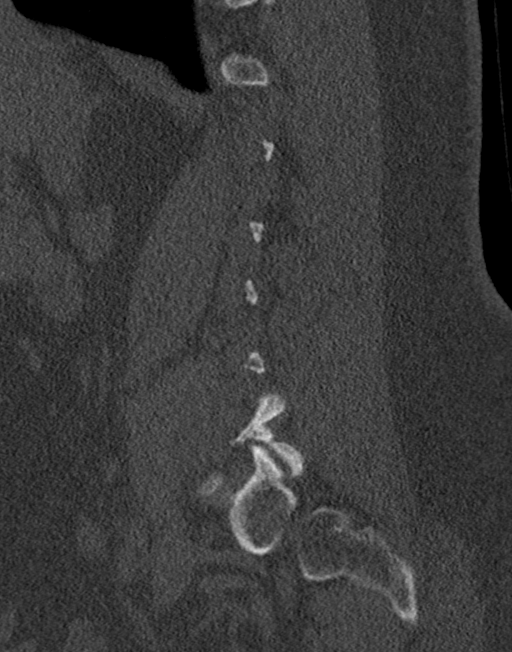

[Series 11: axial · axial · 0.39mm/px · z∈[+848,+1042]mm · 6 of 127 slices shown, 8 images]
[im 20/127  soft-tissue]
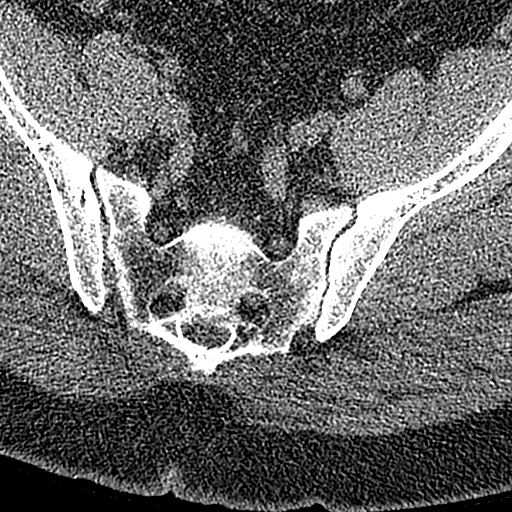
[im 20/127  bone]
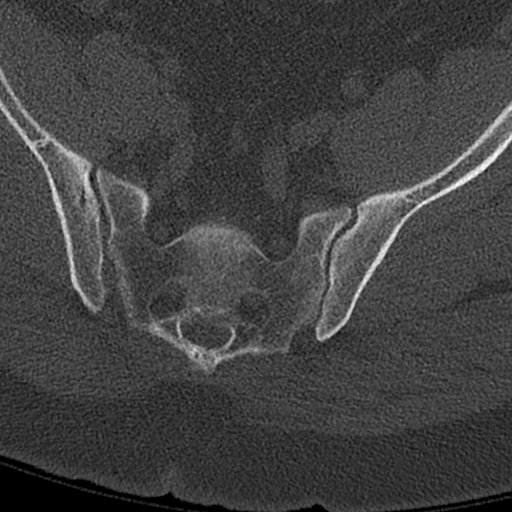
[im 39/127  bone]
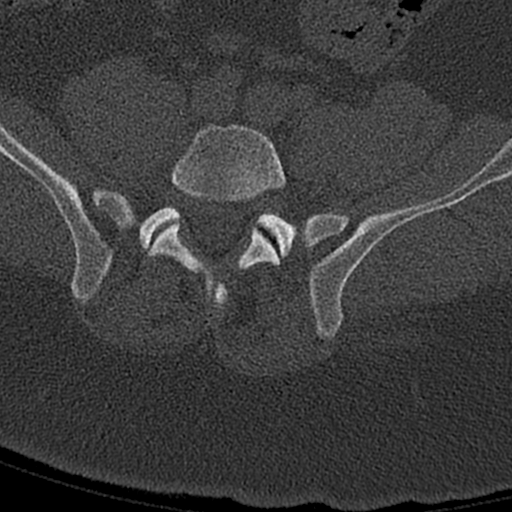
[im 59/127  bone]
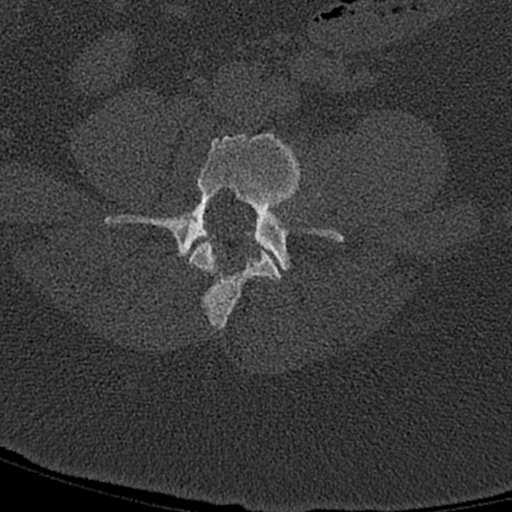
[im 78/127  bone]
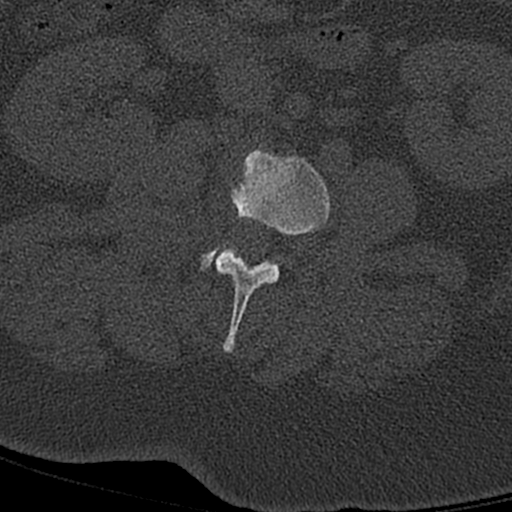
[im 97/127  soft-tissue]
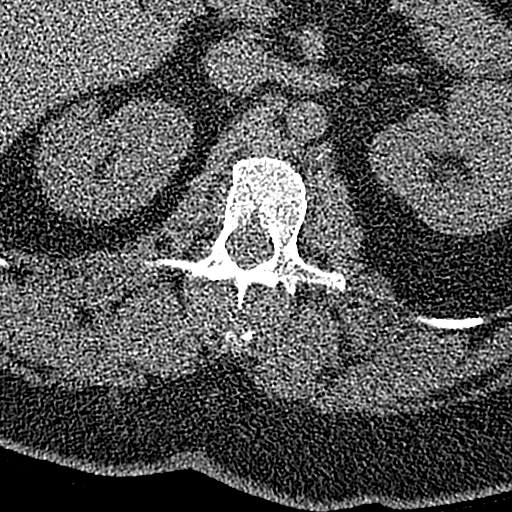
[im 97/127  bone]
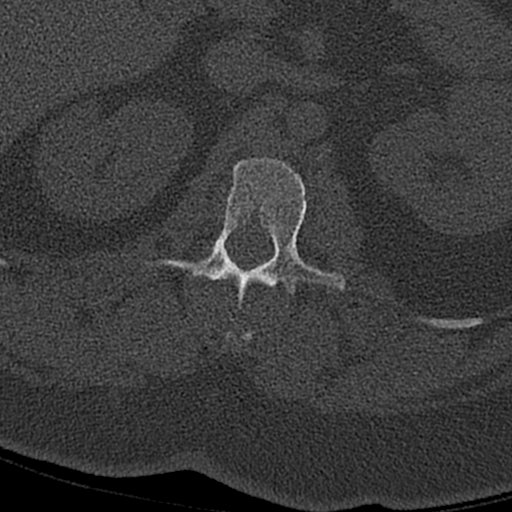
[im 117/127  bone]
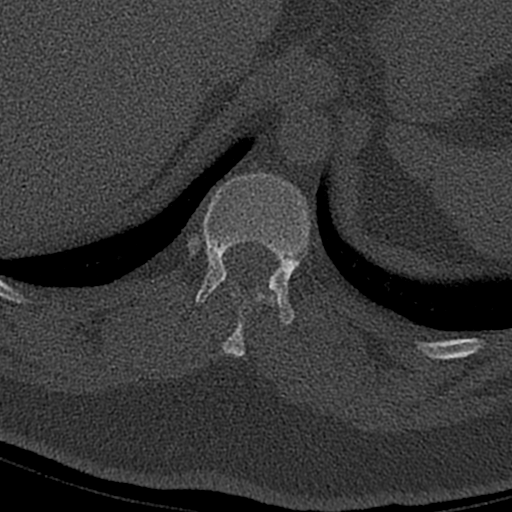

[14 of 35 positions shown; findings below may reference images not displayed]

FINDINGS: Segmentation: There are five lumbar type vertebral bodies. The last
full intervertebral disc space is labeled L5-S1.

Alignment: Moderate left convex lumbar scoliosis. Suspect some
congenital anomaly of L2 and L3 with very concave margins along the
right aspect vertebral body. The facets are normally aligned. No
pars defects.

Vertebrae: No acute vertebral body fracture or worrisome bone
lesion.

Paraspinal and other soft tissues: No significant paraspinal or
retroperitoneal findings.

Disc levels:

T12-L1: No significant findings.

L1-2: No significant findings.

L2-3: Disc space narrowing on the right side but the spinal canal is
generous. No obvious large disc protrusion, spinal or foraminal
stenosis.

L3-4: Mild degenerative disc disease and facet disease but no large
disc protrusions, spinal or foraminal stenosis.

L4-5: No significant findings.

L5-S1: No significant findings.
IMPRESSION: 1. No acute bony findings.
2. Moderate left convex lumbar scoliosis.
3. No large disc protrusions, spinal or foraminal stenosis.

## 2019-04-28 IMAGING — CT CT ABD-PELV W/O CM
3 of 7 series · 11 of 46 positions shown, 17 images · non-contrast
Comparison: [DATE].

CLINICAL DATA: Paresthesias in the back radiating down the leg with
urinary urgency.

EXAM:
CT ABDOMEN AND PELVIS WITHOUT CONTRAST
TECHNIQUE: Multidetector CT imaging of the abdomen and pelvis was performed
following the standard protocol without IV contrast.

[Series 3: a/p w/o 5mm · axial · non-contrast · 0.87mm/px · z∈[+732,+1077]mm · 9 of 87 slices shown, 15 images]
[im 9/87  soft-tissue]
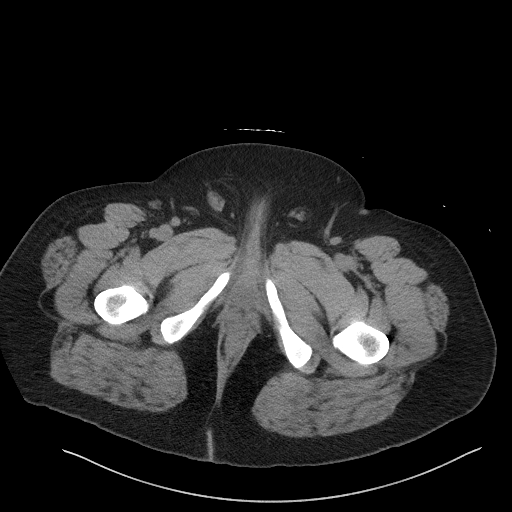
[im 9/87  bone]
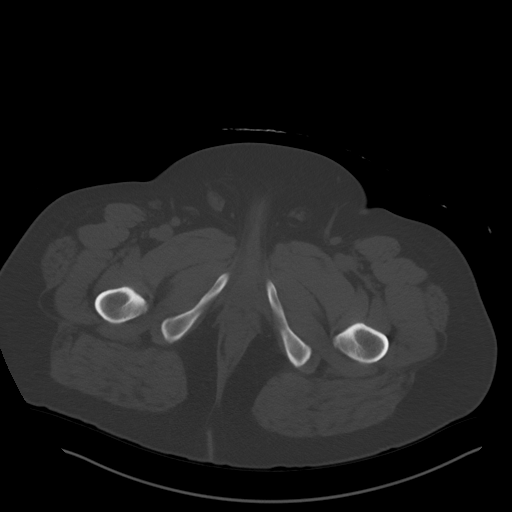
[im 18/87  soft-tissue]
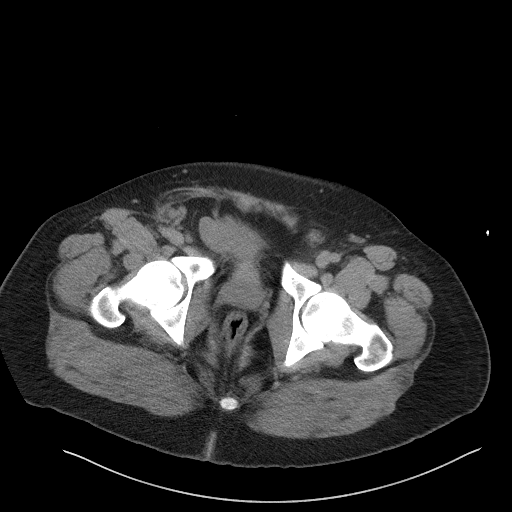
[im 26/87  soft-tissue]
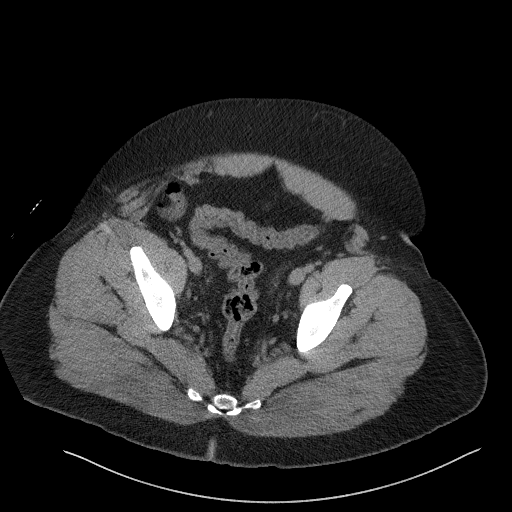
[im 35/87  soft-tissue]
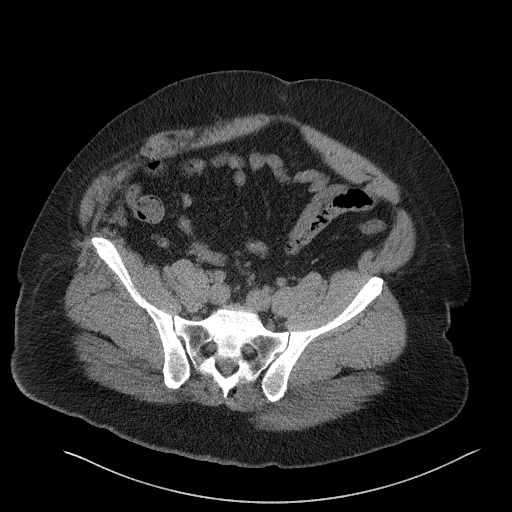
[im 44/87  soft-tissue]
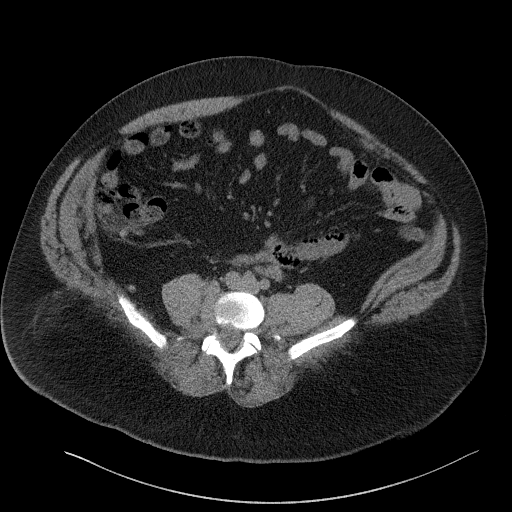
[im 52/87  soft-tissue]
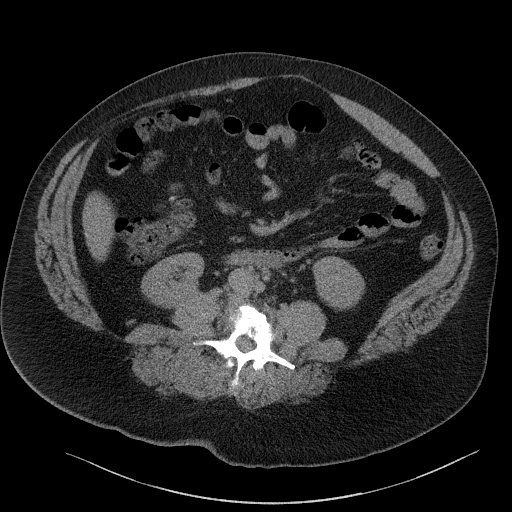
[im 52/87  lung]
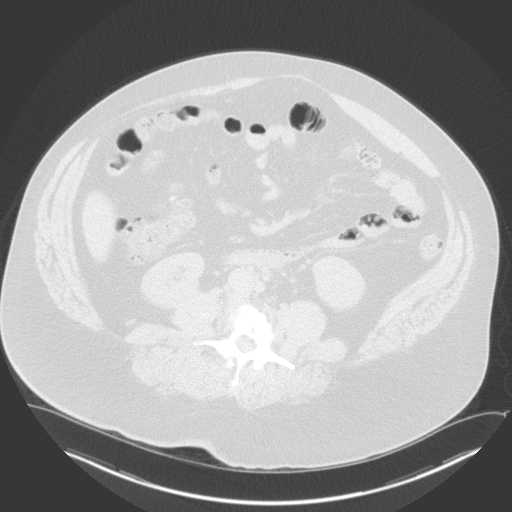
[im 61/87  soft-tissue]
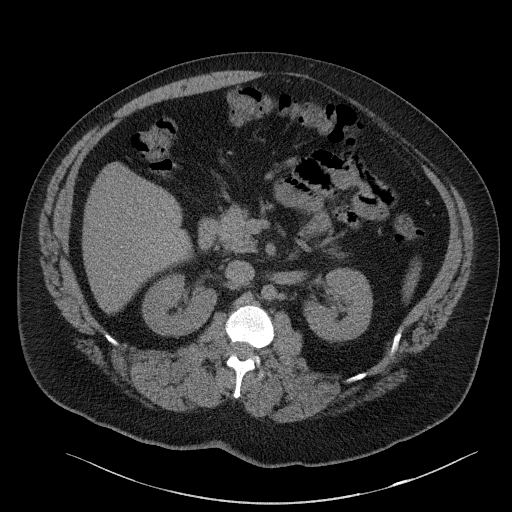
[im 61/87  lung]
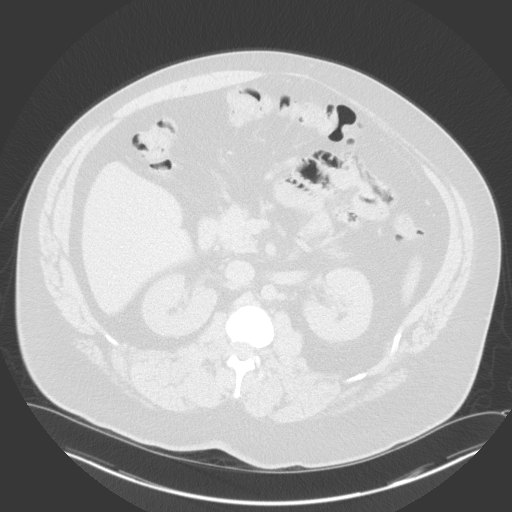
[im 69/87  soft-tissue]
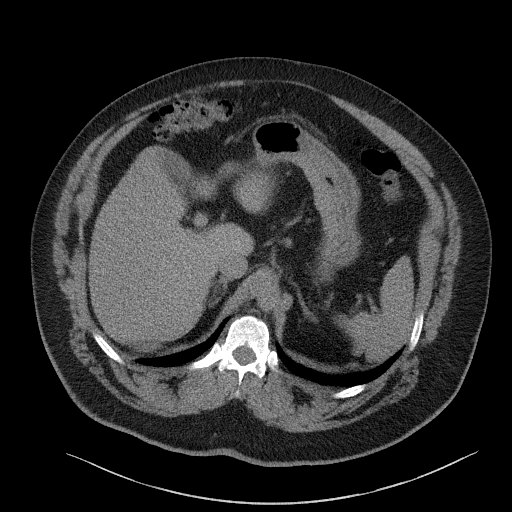
[im 69/87  lung]
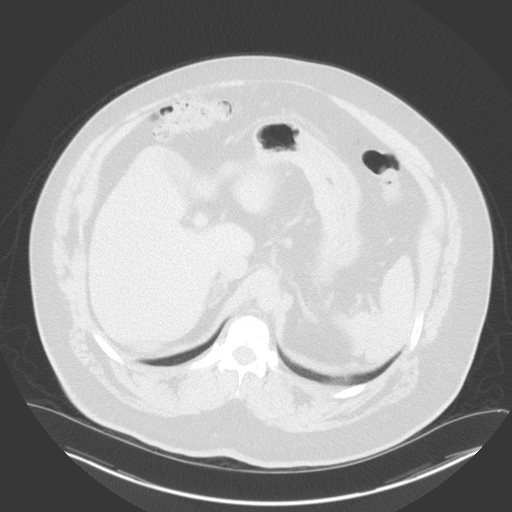
[im 78/87  soft-tissue]
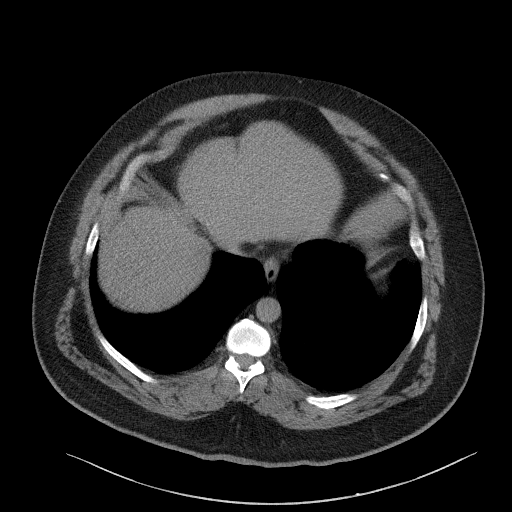
[im 78/87  lung]
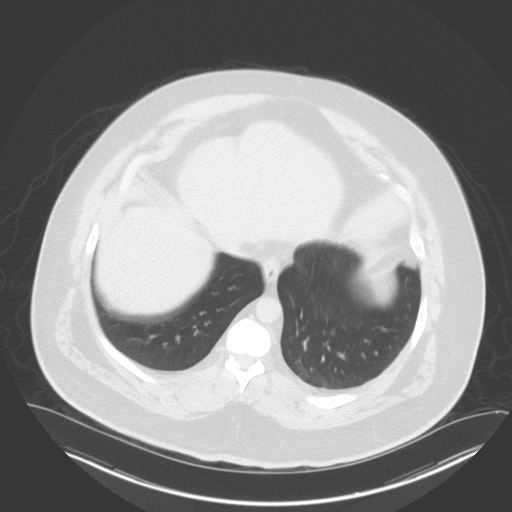
[im 78/87  bone]
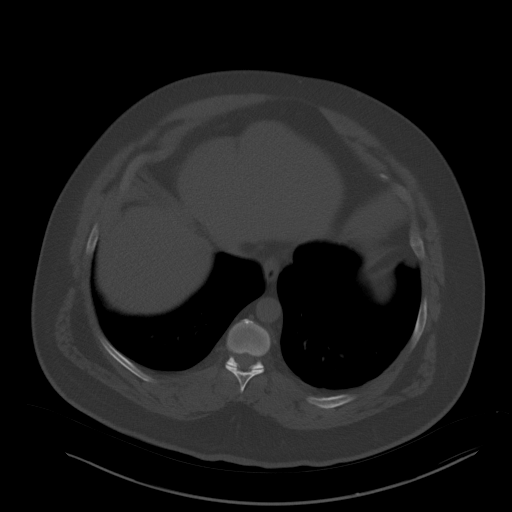

[Series 7: a/p w/o sag · sagittal · non-contrast · 0.72mm/px · 1 of 223 slices shown]
[im 112/223  soft-tissue]
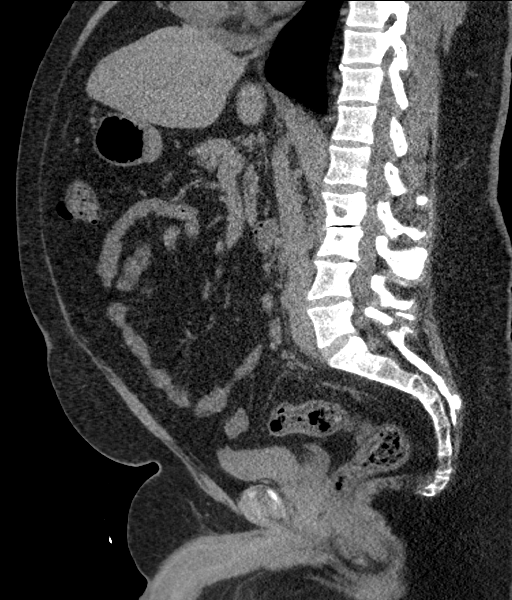

[Series 9: cor · coronal · 0.39mm/px · 1 of 100 slices shown]
[im 50/100  soft-tissue]
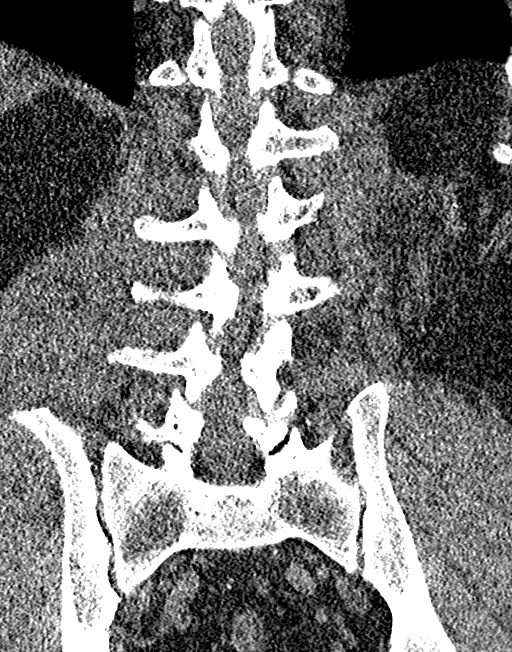

[11 of 46 positions shown; findings below may reference images not displayed]

FINDINGS: Lower chest: Lung bases are clear. Heart is at the upper limits of
normal in size. No pericardial or pleural effusion. Distal esophagus
is unremarkable.

Hepatobiliary: Liver and gallbladder are unremarkable. No biliary
ductal dilatation.

Pancreas: Negative.

Spleen: Negative.

Adrenals/Urinary Tract: Adrenal glands and kidneys are unremarkable.
Ureters are decompressed. The right anterolateral aspect of the
bladder extends slightly into a right inguinal hernia.

Stomach/Bowel: Stomach, small bowel, appendix and colon are
unremarkable.

Vascular/Lymphatic: The inferior infrarenal abdominal aorta is small
in caliber, as on [DATE]. Vascular structures are otherwise
unremarkable. A soft tissue lesion in the left periaortic region
measures 1.3 cm (3/41), unchanged from [DATE], indicative of a
benign lesion. Otherwise, no pathologically enlarged lymph nodes.

Reproductive: Prostate is visualized.

Other: Right inguinal hernia contains the right anterolateral corner
of the bladder. No free fluid.

Musculoskeletal: Degenerative changes at L2-3. Levoconvex scoliosis
centered at L2-3.
IMPRESSION: 1. Right inguinal hernia contains the right anterolateral aspect of
the bladder. No additional findings to explain the patient's
symptoms.
2. CT lumbar spine dictated separately.

## 2019-04-28 MED ORDER — METHOCARBAMOL 500 MG PO TABS: 500.0000 mg | ORAL_TABLET | Freq: Three times a day (TID) | ORAL | 0 refills | Status: DC | PRN

## 2019-04-28 NOTE — ED Provider Notes (Signed)
[facility] EMERGENCY DEPARTMENT Provider Note   CSN: [id number] Arrival date & time: 04/28/19  0102     History CC: Abnormal leg sensation.   Luke Wade is a 39 y.o. male with a history of hypertension who presents to the ED with complaints of intermittent abnormal sensation to his bilateral lower extremities x 1 year, worse over the past 1 week. Patient states that he has episodes where he feels an abnormal sensation which he has trouble describing shoot down the bilateral lower extremities in a symmetric pattern, with these episodes he has an abnormal feeling in the lower back, & that he feels his legs are going to give out. Sometimes he has abdominal cramping with the urge to urinate with these episodes and needs to get to the bathroom quickly to be able to make it, he does not feel he is retaining after urination. Has not noted any bowel incontinence. No specific triggers or alleviating/aggravating factors. Episodes last about 1 minute in duration. Denies back pain, back trauma, saddle anesthesia, fever, chills, IV drug use, dysuria, hematuria, or hx of cancer. Patient has not had prior back surgeries. No recent injuries or change in activity. Sxs do not occur in the UEs. Patient is currently asymptomatic.    HPI     Past Medical History:  Diagnosis Date  . Abnormal myocardial perfusion study 05/02/2017  . Chest pain 04/16/2017  . Cigarette smoker 04/16/2017  . Hypertension 04/16/2017  . Overweight 04/16/2017    Patient Active Problem List   Diagnosis Date Noted  . Abnormal myocardial perfusion study 05/02/2017  . Chest pain 04/16/2017  . Hypertension 04/16/2017  . Overweight 04/16/2017    Past Surgical History:  Procedure Laterality Date  . LEFT HEART CATH AND CORONARY ANGIOGRAPHY N/A 05/03/2017   Procedure: LEFT HEART CATH AND CORONARY ANGIOGRAPHY;  Surgeon: Marykay Lex, MD;  Location: [facility] INVASIVE CV LAB;  Service: Cardiovascular;  Laterality: N/A;    . NO PAST SURGERIES         Family History  Problem Relation Age of Onset  . Cancer Mother   . Cancer Father     Social History   Tobacco Use  . Smoking status: Former Smoker    Packs/day: 0.25    Types: Cigarettes  . Smokeless tobacco: Never Used  Substance Use Topics  . Alcohol use: Yes    Comment: occ  . Drug use: Not Currently    Types: Marijuana    Home Medications Prior to Admission medications   Medication Sig Start Date End Date Taking? Authorizing Provider  aspirin EC 81 MG tablet Take 81 mg by mouth daily.    [provider]  diphenhydrAMINE (BENADRYL) 25 MG tablet Take 1 tablet (25 mg total) by mouth every 6 (six) hours as needed for itching. Patient not taking: Reported on 07/05/2017 05/16/17   Mesner, Barbara Cower, MD  EPINEPHrine 0.3 mg/0.3 mL IJ SOAJ injection Inject 0.3 mg into the muscle as needed (for allergic reaction).  05/16/17   [provider]  famotidine (PEPCID) 20 MG tablet Take 1 tablet (20 mg total) by mouth 2 (two) times daily. Patient not taking: Reported on 07/05/2017 05/16/17   Mesner, Barbara Cower, MD  HYDROcodone-acetaminophen (NORCO) 5-325 MG tablet Take 1-2 tablets by mouth every 6 (six) hours as needed. 07/06/17   Geoffery Lyons, MD  metoprolol tartrate (LOPRESSOR) 50 MG tablet Take 50 mg by mouth 2 (two) times daily. 05/30/17   [provider]  predniSONE (DELTASONE) 20  MG tablet 2 tabs po daily x 4 days Patient not taking: Reported on 07/05/2017 05/16/17   Mesner, Barbara Cower, MD  spironolactone-hydrochlorothiazide (ALDACTAZIDE) 25-25 MG tablet Take 1 tablet by mouth daily. 06/18/17   Revankar, Aundra Dubin, MD    Allergies    Contrast media [iodinated diagnostic agents]  Review of Systems   Review of Systems  Constitutional: Negative for chills and fever.  Respiratory: Negative for shortness of breath.   Cardiovascular: Negative for chest pain.  Gastrointestinal: Positive for abdominal pain. Negative for blood in stool, constipation,  diarrhea, nausea and vomiting.  Genitourinary: Positive for urgency. Negative for dysuria, frequency, genital sores, hematuria, penile swelling, scrotal swelling and testicular pain.  Neurological: Positive for weakness (giving out sensation) and numbness ("abnormal sensation"). Negative for dizziness, tremors, seizures, syncope, facial asymmetry, speech difficulty, light-headedness and headaches.       Negative for urinary retention or incontinence to bowel.   All other systems reviewed and are negative.   Physical Exam Updated Vital Signs BP 133/83 (BP Location: Right Arm)   Pulse 81   Temp (!) 97.3 F (36.3 C) (Oral)   Resp 17   Ht 5\' 5"  (1.651 m)   Wt 111.1 kg   SpO2 99%   BMI 40.77 kg/m   Physical Exam Vitals and nursing note reviewed.  Constitutional:      General: He is not in acute distress.    Appearance: He is well-developed. He is not toxic-appearing.  HENT:     Head: Normocephalic and atraumatic.  Eyes:     General:        Right eye: No discharge.        Left eye: No discharge.     Conjunctiva/sclera: Conjunctivae normal.  Cardiovascular:     Rate and Rhythm: Normal rate and regular rhythm.     Comments: 2+ symmetric DP/PT pulses bilaterally.  Pulmonary:     Effort: Pulmonary effort is normal. No respiratory distress.     Breath sounds: Normal breath sounds. No wheezing, rhonchi or rales.  Abdominal:     General: There is no distension.     Palpations: Abdomen is soft.     Tenderness: There is no abdominal tenderness. There is no guarding or rebound.  Musculoskeletal:     Cervical back: Normal range of motion and neck supple. No spinous process tenderness or muscular tenderness.     Comments: No obvious deformity, appreciable swelling, erythema, ecchymosis, significant open wounds, or increased warmth.  Extremities: Normal ROM. Nontender.  Back: No point/focal vertebral tenderness, no palpable step off or crepitus.  Skin:    General: Skin is warm and dry.       Findings: No rash.  Neurological:     Mental Status: He is alert.     Deep Tendon Reflexes:     Reflex Scores:      Patellar reflexes are 2+ on the right side and 2+ on the left side.    Comments: Sensation grossly intact to bilateral lower extremities. 5/5 symmetric strength with hip flexion/extension, knee flexion/extension, and plantar/dorsiflexion bilaterally. Gait is intact without obvious foot drop.   Psychiatric:        Behavior: Behavior normal.    ED Results / Procedures / Treatments   Labs (all labs ordered are listed, but only abnormal results are displayed) Labs Reviewed  CBC - Abnormal; Notable for the following components:      Result Value   RBC 6.13 (*)    MCH 25.3 (*)  RDW 15.9 (*)    All other components within normal limits  COMPREHENSIVE METABOLIC PANEL  URINALYSIS, ROUTINE W REFLEX MICROSCOPIC    EKG None  Radiology CT Abdomen Pelvis Wo Contrast  Result Date: 04/28/2019 CLINICAL DATA:  Paresthesias in the back radiating down the leg with urinary urgency. EXAM: CT ABDOMEN AND PELVIS WITHOUT CONTRAST TECHNIQUE: Multidetector CT imaging of the abdomen and pelvis was performed following the standard protocol without IV contrast. COMPARISON:  05/16/2017. FINDINGS: Lower chest: Lung bases are clear. Heart is at the upper limits of normal in size. No pericardial or pleural effusion. Distal esophagus is unremarkable. Hepatobiliary: Liver and gallbladder are unremarkable. No biliary ductal dilatation. Pancreas: Negative. Spleen: Negative. Adrenals/Urinary Tract: Adrenal glands and kidneys are unremarkable. Ureters are decompressed. The right anterolateral aspect of the bladder extends slightly into a right inguinal hernia. Stomach/Bowel: Stomach, small bowel, appendix and colon are unremarkable. Vascular/Lymphatic: The inferior infrarenal abdominal aorta is small in caliber, as on 05/16/2017. Vascular structures are otherwise unremarkable. A soft tissue lesion in the  left periaortic region measures 1.3 cm (3/41), unchanged from 05/16/2017, indicative of a benign lesion. Otherwise, no pathologically enlarged lymph nodes. Reproductive: Prostate is visualized. Other: Right inguinal hernia contains the right anterolateral corner of the bladder. No free fluid. Musculoskeletal: Degenerative changes at L2-3. Levoconvex scoliosis centered at L2-3. IMPRESSION: 1. Right inguinal hernia contains the right anterolateral aspect of the bladder. No additional findings to explain the patient's symptoms. 2. CT lumbar spine dictated separately. Electronically Signed   By: Lorin Picket M.D.   On: 04/28/2019 12:08   CT L-SPINE NO CHARGE  Result Date: 04/28/2019 CLINICAL DATA:  Back and left leg pain. EXAM: CT LUMBAR SPINE WITHOUT CONTRAST TECHNIQUE: Multidetector CT imaging of the lumbar spine was performed without intravenous contrast administration. Multiplanar CT image reconstructions were also generated. COMPARISON:  None. FINDINGS: Segmentation: There are five lumbar type vertebral bodies. The last full intervertebral disc space is labeled L5-S1. Alignment: Moderate left convex lumbar scoliosis. Suspect some congenital anomaly of L2 and L3 with very concave margins along the right aspect vertebral body. The facets are normally aligned. No pars defects. Vertebrae: No acute vertebral body fracture or worrisome bone lesion. Paraspinal and other soft tissues: No significant paraspinal or retroperitoneal findings. Disc levels: T12-L1: No significant findings. L1-2: No significant findings. L2-3: Disc space narrowing on the right side but the spinal canal is generous. No obvious large disc protrusion, spinal or foraminal stenosis. L3-4: Mild degenerative disc disease and facet disease but no large disc protrusions, spinal or foraminal stenosis. L4-5: No significant findings. L5-S1: No significant findings. IMPRESSION: 1. No acute bony findings. 2. Moderate left convex lumbar scoliosis. 3. No  large disc protrusions, spinal or foraminal stenosis. Electronically Signed   By: Marijo Sanes M.D.   On: 04/28/2019 12:29    Procedures Procedures (including critical care time)  Medications Ordered in ED Medications - No data to display  ED Course  I have reviewed the triage vital signs and the nursing notes.  Pertinent labs & imaging results that were available during my care of the patient were reviewed by me and considered in my medical decision making (see chart for details).    MDM Rules/Calculators/A&P                     Patient presents to the ED with complaints of intermittent abnormal sensation to the bilateral lower extremities for the past year- increased frequency for the past 1 week.  Patient is nontoxic, vitals without significant abnormality. Currently asymptomatic. Benign physical exam. Plan for labs, CT A/P and CT L spine as well as post void residual bladder scan for further assessment per discussion with Dr. Pilar Plate.   CBC: No significant anemia, no leukocytosis.  CMP: No significant electrolyte derangement.  UA: No UTI, no hematuria.   CT A/P:  Right inguinal hernia contains the right anterolateral aspect of the bladder. No additional findings to explain the patient's symptoms.   CT L Spine: 1. No acute bony findings. 2. Moderate left convex lumbar scoliosis. 3. No large disc protrusions, spinal or foraminal stenosis.  Overall reassuring work-up.  Patient with reassuring serial neurologic exams- no focal deficits, ambulatory, symmetric DTRs, residual void bladder scan obtained shortly after urinating as opposed to immediate with <30 ccs present- no significant retention, do not suspect acute cauda equina or significant spinal cord impingement. CT imaging without large disc protrusions, spinal or foraminal stenosis. CT also without mass/cancerous process leading to compression and cause of sxs. Patient's LEs are symmetric in color and temperature, 2+ symmetric DP/PT  pulses, no edema, legs are nontender, do not suspect arterial emboli or DVT. No fever, no hx of IVDU, do not suspect IVDU. Unclear definitve cause of patient's symptoms, considering muscle spasm therefore will trial robaxin- discussed no driving/operating heavy machinery with this medicine. Close PCP follow up with strict return precautions. I discussed results, treatment plan, need for follow-up, and return precautions with the patient. Provided opportunity for questions, patient confirmed understanding and is in agreement with plan.   Findings and plan of care discussed with supervising physician Dr. Pilar Plate who is in agreement.   Final Clinical Impression(s) / ED Diagnoses Final diagnoses:  Numbness    Rx / DC Orders ED Discharge Orders         Ordered    methocarbamol (ROBAXIN) 500 MG tablet  Every 8 hours PRN     04/28/19 1304           Rhianne Soman, [place], PA-C 04/28/19 1319    Sabas Sous, MD 04/29/19 [phone]

## 2019-04-28 NOTE — ED Notes (Signed)
Pt taken to CT.

## 2019-04-28 NOTE — ED Notes (Signed)
Patient verbalizes understanding of discharge instructions. Opportunity for questioning and answers were provided. Armband removed by staff, pt discharged from ED. Ambulated out to lobby  

## 2019-04-28 NOTE — Discharge Instructions (Signed)
You were seen in the emergency department today for an abnormal sensation in your legs that you get at times.  Your labs were overall reassuring.  Your CT scan did not show a cause to this problem.  Your CT scan did show that you have a hernia in her right groin area.  We are sending you home with Robaxin to help with possible muscle spasm cause. - Robaxin is the muscle relaxer I have prescribed, this is meant to help with muscle tightness. Be aware that this medication may make you drowsy therefore the first time you take this it should be at a time you are in an environment where you can rest. Do not drive or operate heavy machinery when taking this medication. Do not drink alcohol or take other sedating medications with this medicine such as narcotics or benzodiazepines.   You make take Tylenol per over the counter dosing with these medications.   We have prescribed you new medication(s) today. Discuss the medications prescribed today with your pharmacist as they can have adverse effects and interactions with your other medicines including over the counter and prescribed medications. Seek medical evaluation if you start to experience new or abnormal symptoms after taking one of these medicines, seek care immediately if you start to experience difficulty breathing, feeling of your throat closing, facial swelling, or rash as these could be indications of a more serious allergic reaction  We would like you to follow-up very closely with your primary care provider within the next 2 days.  Return to the emergency department for new or worsening symptoms including but not limited to persistent/new/worse abnormal sensation/numbness/weakness, trouble walking, loss of control of your bowel or bladder function, not feeling that you can completely empty your bladder, fever, or any other concerns.

## 2019-04-28 NOTE — ED Triage Notes (Signed)
Pt here with c/o leg pain , pt has had it happen before but it went away

## 2019-05-13 ENCOUNTER — Other Ambulatory Visit: Payer: Self-pay | Admitting: Family Medicine

## 2019-05-13 DIAGNOSIS — R200 Anesthesia of skin: Secondary | ICD-10-CM

## 2019-05-19 ENCOUNTER — Other Ambulatory Visit: Payer: Self-pay

## 2019-05-19 ENCOUNTER — Other Ambulatory Visit: Payer: Self-pay | Admitting: Family Medicine

## 2019-05-20 ENCOUNTER — Ambulatory Visit
Admission: RE | Admit: 2019-05-20 | Discharge: 2019-05-20 | Disposition: A | Discharge status: Home / Self Care | Payer: BC Managed Care – PPO | Source: Ambulatory Visit | Attending: Family Medicine | Admitting: Family Medicine

## 2019-05-20 ENCOUNTER — Other Ambulatory Visit: Payer: Self-pay

## 2019-05-20 DIAGNOSIS — R200 Anesthesia of skin: Secondary | ICD-10-CM
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
## 2019-06-05 ENCOUNTER — Other Ambulatory Visit: Payer: Self-pay | Admitting: Neurological Surgery

## 2019-06-05 DIAGNOSIS — M5416 Radiculopathy, lumbar region: Secondary | ICD-10-CM

## 2019-06-09 ENCOUNTER — Ambulatory Visit
Admission: RE | Admit: 2019-06-09 | Discharge: 2019-06-09 | Disposition: A | Discharge status: Home / Self Care | Payer: BC Managed Care – PPO | Source: Ambulatory Visit | Attending: Neurological Surgery | Admitting: Neurological Surgery

## 2019-06-09 ENCOUNTER — Ambulatory Visit: Payer: Self-pay | Admitting: Cardiology

## 2019-06-09 DIAGNOSIS — M5416 Radiculopathy, lumbar region: Secondary | ICD-10-CM

## 2019-06-09 IMAGING — MR MR LUMBAR SPINE W/O CM
4 of 5 series · 25 of 48 positions shown · non-contrast
Comparison: CT of the lumbar spine [DATE]

CLINICAL DATA: Lumbar radiculopathy. Additional history provided:
Patient reports back pain, numbness and weakness in legs for 2
years.

EXAM:
MRI LUMBAR SPINE WITHOUT CONTRAST
TECHNIQUE: Multiplanar, multisequence MR imaging of the lumbar spine was
performed. No intravenous contrast was administered.

[Series 3: T2 post-contrast · sagittal · 4.0mm · 0.55mm/px · 6 of 13 slices shown]
[im 1/13]
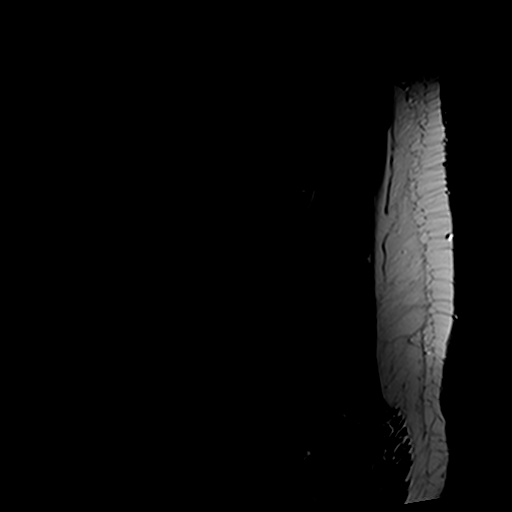
[im 3/13]
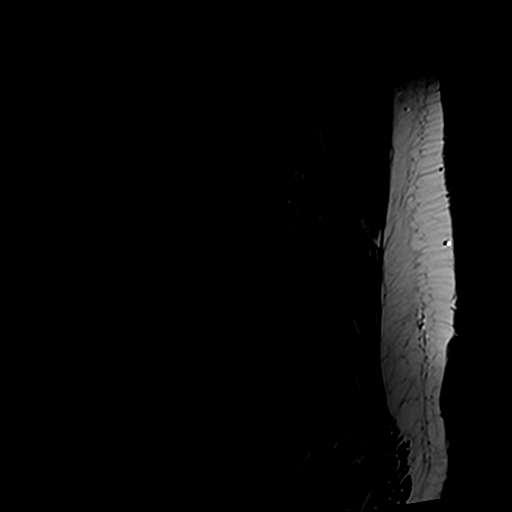
[im 5/13]
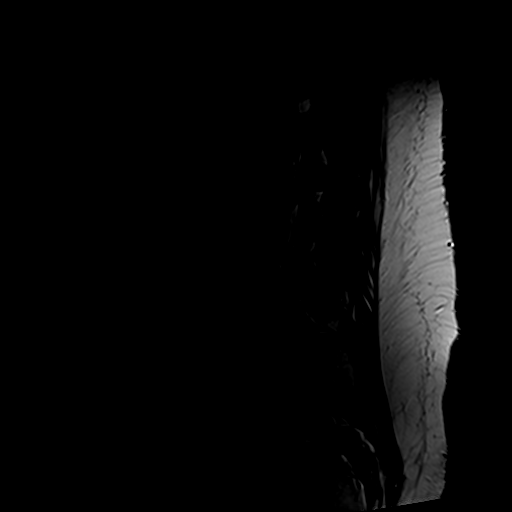
[im 8/13]
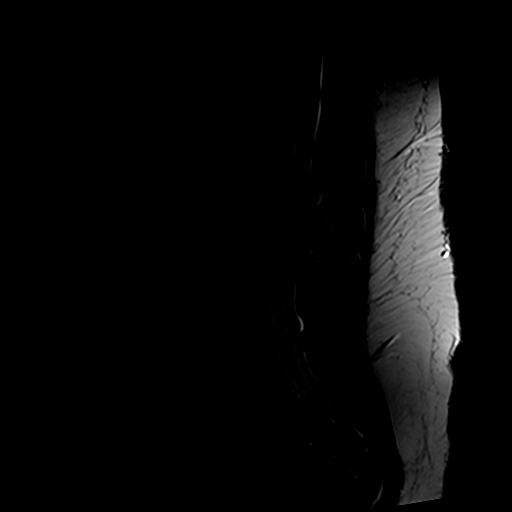
[im 10/13]
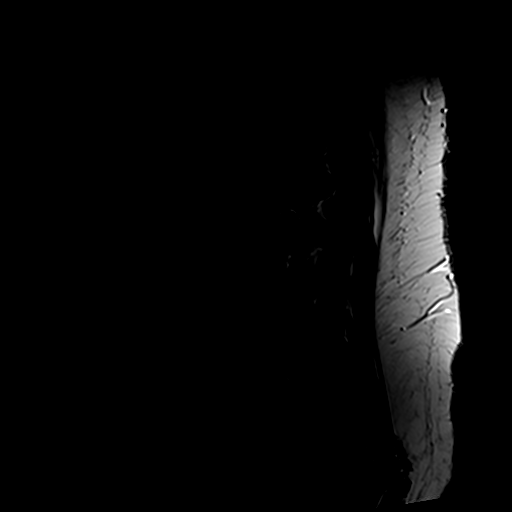
[im 13/13]
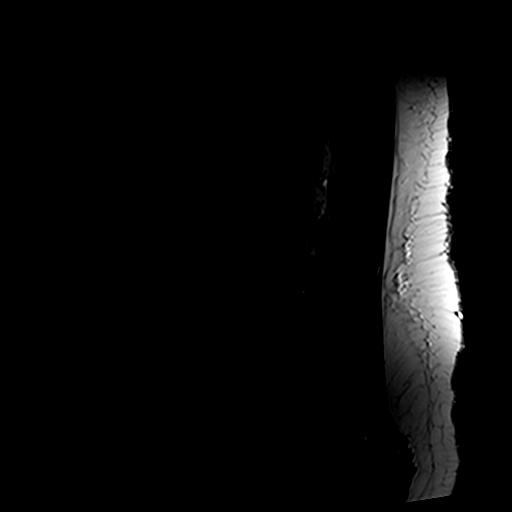

[Series 5: T1 · sagittal · 4.0mm · 0.55mm/px · 6 of 13 slices shown (1 of 2)]
[im 1/13]
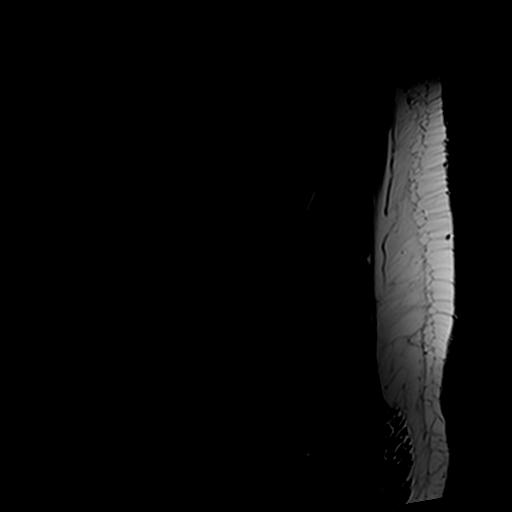
[im 3/13]
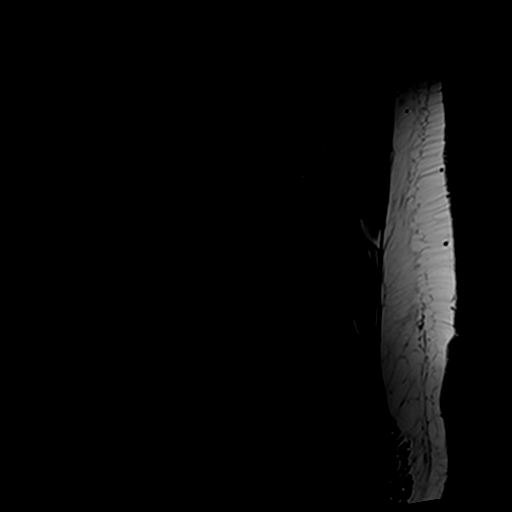
[im 5/13]
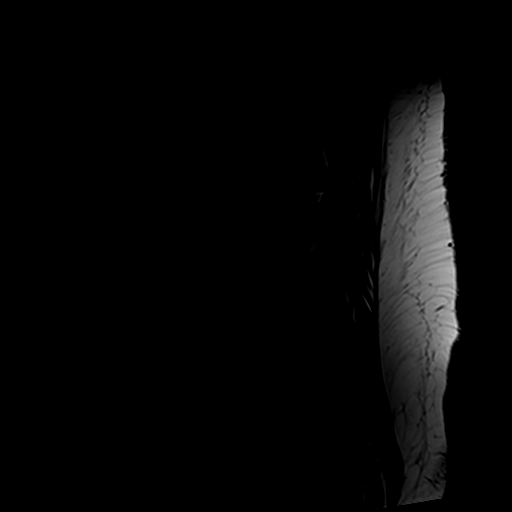
[im 8/13]
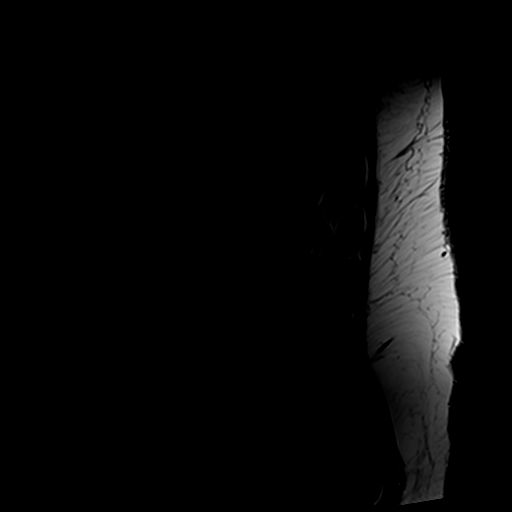
[im 10/13]
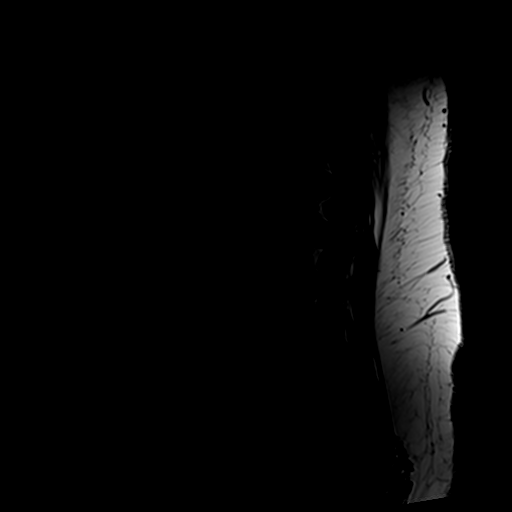
[im 13/13]
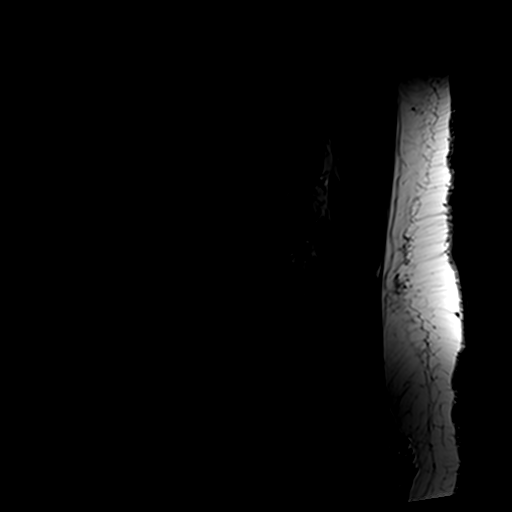

[Series 6: T1 · axial · 4.0mm · 0.37mm/px · z∈[-37,+91]mm · 4 of 31 slices shown (2 of 2)]
[im 1/31]
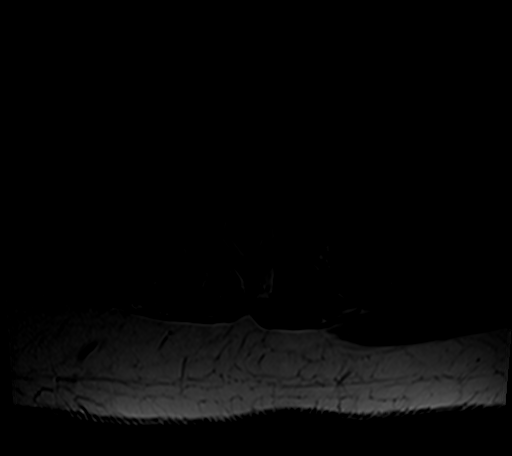
[im 5/31]
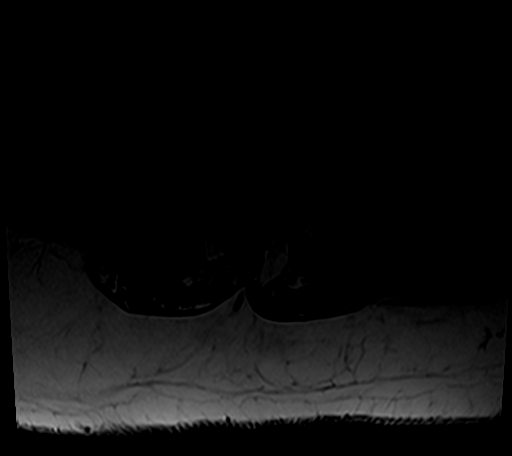
[im 16/31]
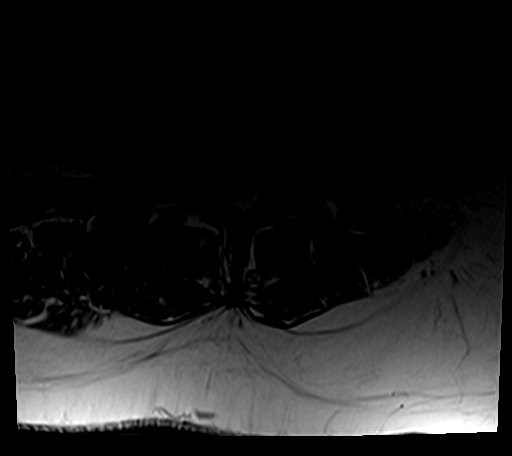
[im 26/31]
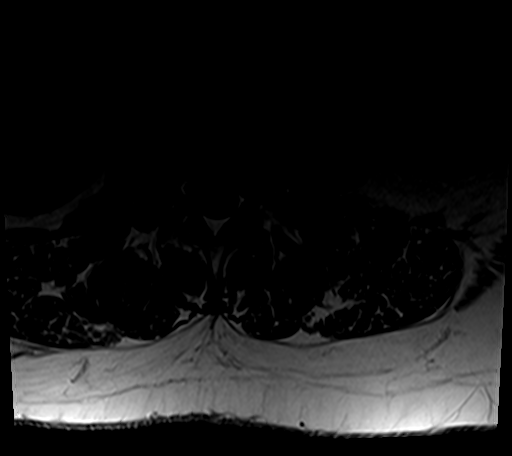

[Series 7: T2 · axial · 4.0mm · 0.74mm/px · z∈[-37,+129]mm · 9 of 31 slices shown]
[im 1/31]
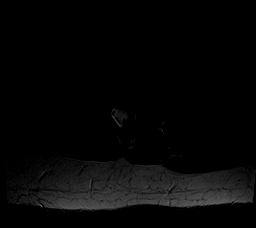
[im 5/31]
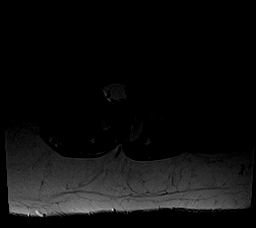
[im 9/31]
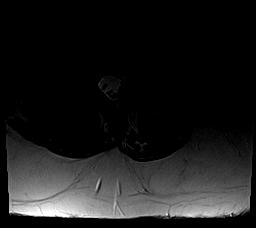
[im 13/31]
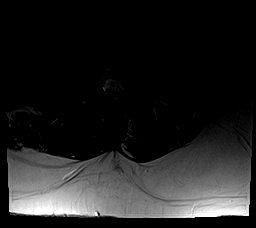
[im 16/31]
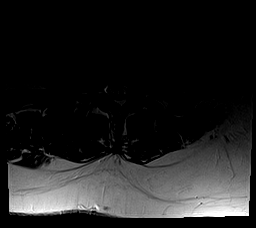
[im 18/31]
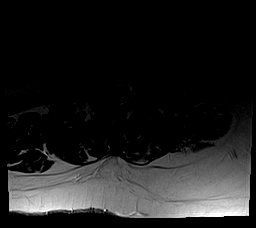
[im 22/31]
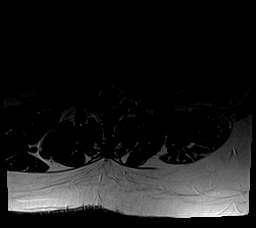
[im 26/31]
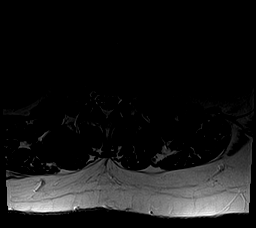
[im 31/31]
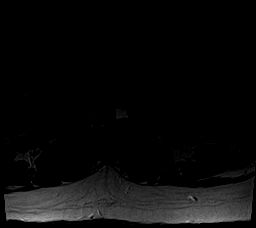

[25 of 48 positions shown; findings below may reference images not displayed]

FINDINGS: Segmentation:  5 lumbar vertebrae.

Alignment: Redemonstrated prominent lumbar levocurvature centered at
L2-L3. Left lateral subluxation of L3 upon L4 was better appreciated
on prior CT [DATE].

Vertebrae: Vertebral body height is maintained. Prominent
degenerative endplate marrow edema on the right at L2-L3.

Conus medullaris and cauda equina: Conus extends to the T12-L1
level. No signal abnormality within the visualized distal spinal
cord.

Paraspinal and other soft tissues: No abnormality identified.

Disc levels:

Moderate L2-L3 and L3-L4 disc degeneration. No more than mild disc
degeneration at the remaining levels.

T12-L1: No significant disc herniation or stenosis. Left-sided nerve
root sheath cyst.

L1-L2: No significant disc herniation or stenosis.

L2-L3: Disc bulge. Endplate spurring along the right lateral aspect
of the disc space. Mild facet arthrosis/ligamentum flavum
hypertrophy. No significant spinal canal stenosis or neural
foraminal narrowing.

L3-L4: Disc bulge with endplate spurring. Mild facet arthrosis. No
significant spinal canal stenosis. Moderate left neural foraminal
narrowing.

L4-L5: Mild facet arthrosis. No significant disc herniation or
spinal canal stenosis. Mild left neural foraminal narrowing.

L5-S1: No significant disc herniation or spinal canal stenosis. Mild
facet arthrosis. Mild left neural foraminal narrowing.
IMPRESSION: 1. Lumbar spondylosis as outlined and most notably as follows.
2. Moderate disc degeneration at L2-L3 and L3-L4.
3. No significant spinal canal stenosis at any level.
4. Multifactorial L3-L4 moderate left neural foraminal narrowing.
Facet arthrosis also contributes to mild left-sided neural foraminal
narrowing at L4-L5 and L5-S1.
5. Prominent lumbar levocurvature centered at L2-L3. Prominent
degenerative endplate marrow edema on the right at this level.
6. L3-L4 left lateral subluxation was better appreciated on prior CT
[DATE].

## 2019-06-12 ENCOUNTER — Ambulatory Visit: Payer: BC Managed Care – PPO | Attending: Internal Medicine

## 2019-06-12 DIAGNOSIS — Z23 Encounter for immunization: Secondary | ICD-10-CM

## 2019-06-12 NOTE — Progress Notes (Signed)
   Covid-19 Vaccination Clinic  Name:  CONALL VANGORDER    MRN: [id number] DOB: Jun 29, 1980  06/12/2019  Mr. Lindahl was observed post Covid-19 immunization for 30 minutes based on pre-vaccination screening without incident. He was provided with Vaccine Information Sheet and instruction to access the V-Safe system.   Mr. Stalvey was instructed to call 911 with any severe reactions post vaccine: Marland Kitchen Difficulty breathing  . Swelling of face and throat  . A fast heartbeat  . A bad rash all over body  . Dizziness and weakness   Immunizations Administered    Name Date Dose VIS Date Route   Pfizer COVID-19 Vaccine 06/12/2019 12:20 PM 0.3 mL 04/02/2018 Intramuscular   Manufacturer: [employer], [employer]   Lot: [id number]   NDC: [address]-8

## 2019-07-08 ENCOUNTER — Ambulatory Visit: Payer: BC Managed Care – PPO | Attending: Internal Medicine

## 2019-07-08 DIAGNOSIS — Z23 Encounter for immunization: Secondary | ICD-10-CM

## 2019-07-08 NOTE — Progress Notes (Signed)
° °  Covid-19 Vaccination Clinic  Name:  Luke Wade    MRN: [id number] DOB: 05/23/1980  07/08/2019  Luke Wade was observed post Covid-19 immunization for 15 minutes without incident. He was provided with Vaccine Information Sheet and instruction to access the V-Safe system.   Luke Wade was instructed to call 911 with any severe reactions post vaccine:  Difficulty breathing   Swelling of face and throat   A fast heartbeat   A bad rash all over body   Dizziness and weakness   Immunizations Administered    Name Date Dose VIS Date Route   Pfizer COVID-19 Vaccine 07/08/2019 11:55 AM 0.3 mL 04/02/2018 Intramuscular   Manufacturer: [employer], [employer]   Lot: [id number]   NDC: [address]-6

## 2020-02-08 DIAGNOSIS — W228XXA Striking against or struck by other objects, initial encounter: Secondary | ICD-10-CM | POA: Diagnosis not present

## 2020-02-08 DIAGNOSIS — Z5321 Procedure and treatment not carried out due to patient leaving prior to being seen by health care provider: Secondary | ICD-10-CM | POA: Diagnosis not present

## 2020-02-08 DIAGNOSIS — S0990XA Unspecified injury of head, initial encounter: Secondary | ICD-10-CM | POA: Diagnosis not present
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
## 2020-02-09 ENCOUNTER — Emergency Department (HOSPITAL_COMMUNITY)
Admission: EM | Admit: 2020-02-09 | Discharge: 2020-02-09 | Disposition: A | Discharge status: Home / Self Care | Payer: BC Managed Care – PPO | Attending: Emergency Medicine | Admitting: Emergency Medicine

## 2020-02-09 ENCOUNTER — Encounter (HOSPITAL_COMMUNITY): Payer: Self-pay | Admitting: *Deleted

## 2020-02-09 ENCOUNTER — Other Ambulatory Visit: Payer: Self-pay

## 2020-02-09 DIAGNOSIS — S0990XA Unspecified injury of head, initial encounter: Secondary | ICD-10-CM | POA: Diagnosis present

## 2020-02-09 DIAGNOSIS — Z87891 Personal history of nicotine dependence: Secondary | ICD-10-CM | POA: Insufficient documentation

## 2020-02-09 DIAGNOSIS — Z79899 Other long term (current) drug therapy: Secondary | ICD-10-CM | POA: Insufficient documentation

## 2020-02-09 DIAGNOSIS — Y92009 Unspecified place in unspecified non-institutional (private) residence as the place of occurrence of the external cause: Secondary | ICD-10-CM | POA: Insufficient documentation

## 2020-02-09 DIAGNOSIS — G44309 Post-traumatic headache, unspecified, not intractable: Secondary | ICD-10-CM | POA: Insufficient documentation

## 2020-02-09 DIAGNOSIS — W228XXA Striking against or struck by other objects, initial encounter: Secondary | ICD-10-CM | POA: Insufficient documentation

## 2020-02-09 DIAGNOSIS — G44319 Acute post-traumatic headache, not intractable: Secondary | ICD-10-CM

## 2020-02-09 DIAGNOSIS — Z7982 Long term (current) use of aspirin: Secondary | ICD-10-CM | POA: Diagnosis not present

## 2020-02-09 DIAGNOSIS — I10 Essential (primary) hypertension: Secondary | ICD-10-CM | POA: Diagnosis not present

## 2020-02-09 NOTE — ED Provider Notes (Signed)
[facility] DEPT Provider Note   CSN: [id number] Arrival date & time: 02/09/20  1736     History Chief Complaint  Patient presents with  . Head Injury    Luke Wade is a 40 y.o. male with history of hypertension.  Patient presents with a chief complaint of head injury.  Patient reports that on Saturday 02/07/20 he was installing a washer and dryer in his home and struck the top of his head against a wall.   Patient reports that he felt "dazed," after hitting his head but denies any loss of consciousness.  He has no memory loss, no episodes of vomiting after hitting his head.  Patient complains of a headache, headache is intermittent, when present 10/10 on pain scale, located all over his head, worse with activity and light. Patient reports that his headache pain comes on gradually, pain is not maximally onset.  Patient denies any acute neck or back pain associated to his injury.  Patient reports that he has back on and off to exertion from his job.  Patient denies any numbness or tingling in extremities, weakness in extremities, facial asymmetry, slurred speech, bowel or bladder incontinence, bowel or bladder retention.    HPI     Past Medical History:  Diagnosis Date  . Abnormal myocardial perfusion study 05/02/2017  . Chest pain 04/16/2017  . Cigarette smoker 04/16/2017  . Hypertension 04/16/2017  . Overweight 04/16/2017    Patient Active Problem List   Diagnosis Date Noted  . Abnormal myocardial perfusion study 05/02/2017  . Chest pain 04/16/2017  . Hypertension 04/16/2017  . Overweight 04/16/2017    Past Surgical History:  Procedure Laterality Date  . LEFT HEART CATH AND CORONARY ANGIOGRAPHY N/A 05/03/2017   Procedure: LEFT HEART CATH AND CORONARY ANGIOGRAPHY;  Surgeon: Leonie Man, MD;  Location: [facility];  Service: Cardiovascular;  Laterality: N/A;  . NO PAST SURGERIES         Family History  Problem Relation Age of Onset   . Cancer Mother   . Cancer Father     Social History   Tobacco Use  . Smoking status: Former Smoker    Packs/day: 0.25    Types: Cigarettes  . Smokeless tobacco: Never Used  Substance Use Topics  . Alcohol use: Yes    Comment: occ  . Drug use: Not Currently    Types: Marijuana    Home Medications Prior to Admission medications   Medication Sig Start Date End Date Taking? Authorizing Provider  aspirin EC 81 MG tablet Take 81 mg by mouth daily.    [provider]  EPINEPHrine 0.3 mg/0.3 mL IJ SOAJ injection Inject 0.3 mg into the muscle as needed (for allergic reaction).  05/16/17   [provider]  gabapentin (NEURONTIN) 300 MG capsule Take 300 mg by mouth daily. 01/10/20   [provider]  HYDROcodone-acetaminophen (NORCO) 5-325 MG tablet Take 1-2 tablets by mouth every 6 (six) hours as needed. Patient not taking: No sig reported 07/06/17   Veryl Speak, MD  losartan (COZAAR) 100 MG tablet Take 100 mg by mouth daily. 02/01/20   [provider]  methocarbamol (ROBAXIN) 500 MG tablet Take 1 tablet (500 mg total) by mouth every 8 (eight) hours as needed for muscle spasms. Patient not taking: No sig reported 04/28/19   Petrucelli, Samantha R, PA-C  metoprolol tartrate (LOPRESSOR) 50 MG tablet Take 50 mg by mouth 2 (two) times daily. 05/30/17   [provider]  spironolactone-hydrochlorothiazide (ALDACTAZIDE) 25-25 MG tablet Take 1 tablet by mouth daily. Patient not taking: No sig reported 06/18/17   Revankar, Aundra Dubin, MD  diphenhydrAMINE (BENADRYL) 25 MG tablet Take 1 tablet (25 mg total) by mouth every 6 (six) hours as needed for itching. Patient not taking: No sig reported 05/16/17 04/28/19  Mesner, Barbara Cower, MD  famotidine (PEPCID) 20 MG tablet Take 1 tablet (20 mg total) by mouth 2 (two) times daily. Patient not taking: Reported on 07/05/2017 05/16/17 04/28/19  Mesner, Barbara Cower, MD    Allergies    Contrast media [iodinated diagnostic  agents]  Review of Systems   Review of Systems  Eyes: Positive for photophobia. Negative for visual disturbance.  Musculoskeletal: Negative for back pain and neck pain.  Neurological: Positive for headaches. Negative for dizziness, tremors, seizures, syncope, facial asymmetry, speech difficulty, weakness, light-headedness and numbness.  Psychiatric/Behavioral: Negative for confusion.    Physical Exam Updated Vital Signs BP (!) 155/98 (BP Location: Left Arm)   Pulse 71   Temp 97.8 F (36.6 C) (Oral)   Resp 18   SpO2 99%   Physical Exam Vitals and nursing note reviewed.  Constitutional:      General: He is not in acute distress.    Appearance: He is obese. He is not ill-appearing, toxic-appearing or diaphoretic.  HENT:     Head: Normocephalic and atraumatic. No raccoon eyes or Battle's sign.     Jaw: No trismus.     Mouth/Throat:     Mouth: Mucous membranes are moist.     Pharynx: Oropharynx is clear.  Eyes:     General: No scleral icterus.       Right eye: No discharge.        Left eye: No discharge.     Extraocular Movements: Extraocular movements intact.     Pupils: Pupils are equal, round, and reactive to light.  Cardiovascular:     Rate and Rhythm: Normal rate.  Pulmonary:     Effort: Pulmonary effort is normal.  Musculoskeletal:     Cervical back: Normal range of motion and neck supple. No deformity, rigidity, tenderness or bony tenderness. No pain with movement, spinous process tenderness or muscular tenderness. Normal range of motion.     Thoracic back: No deformity, tenderness or bony tenderness.     Lumbar back: No deformity, tenderness or bony tenderness.  Skin:    General: Skin is warm and dry.  Neurological:     General: No focal deficit present.     Mental Status: He is alert.     GCS: GCS eye subscore is 4. GCS verbal subscore is 5. GCS motor subscore is 6.     Cranial Nerves: No cranial nerve deficit or facial asymmetry.     Sensory: Sensation is  intact.     Motor: No weakness, tremor, seizure activity or pronator drift.     Coordination: Romberg sign negative.     Gait: Gait is intact. Gait normal.     Comments: CN II-XII intact, equal grip strength, +5 strength to bilateral upper and lower extremities   Psychiatric:        Behavior: Behavior is cooperative.     ED Results / Procedures / Treatments   Labs (all labs ordered are listed, but only abnormal results are displayed) Labs Reviewed - No data to display  EKG None  Radiology No results found.  Procedures Procedures (including critical care time)  Medications Ordered in ED Medications - No data to display  ED Course  I have reviewed the triage vital signs and the nursing notes.  Pertinent labs & imaging results that were available during my care of the patient were reviewed by me and considered in my medical decision making (see chart for details).    MDM Rules/Calculators/A&P                          Alert 40 year old male no acute distress, nontoxic-appearing.  Presents with chief complaint of headaches that occurred after hitting his head.  Patient struck his head against a wall on 02/07/20, no loss of consciousness, no episodes of vomiting, no memory loss, no seizure activity.  Patient is not on any blood thinners.  Patient denies any numbness or tingling in extremities, weakness in extremities, bowel or bladder dysfunction, visual disturbance, no neurological deficit.  Patient reports that headache began gradually, pain was not maximal at onset.  Patient has no focal neurological deficits on exam, no raccoon eye or battle sign, head is atraumatic, no spinal process tenderness.  Per [place] CT head injury patient does not meet criteria for head CT.  Less concern for [facility] as headache began gradually and was not maximal at onset.  Possible the patient is suffering from concussion after hitting his head.  Will have patient follow-up with his primary care provider.   Patient advised of return precautions. Patient verbalized understanding and agreed with plan.    Final Clinical Impression(s) / ED Diagnoses Final diagnoses:  Acute post-traumatic headache, not intractable  Injury of head, initial encounter    Rx / DC Orders ED Discharge Orders    None       Berneice Heinrich 02/09/20 2216    Alvira Monday, MD 02/10/20 1207

## 2020-02-09 NOTE — ED Notes (Signed)
Went to recheck pt's VS Pt asked about wait time and informed that there was a significant wait Pt left the lobby stating that he wasn't going to wait

## 2020-02-09 NOTE — ED Triage Notes (Signed)
Pt hit his head on 1/1. No loss of consciousness. Complains of headaches.

## 2020-02-09 NOTE — Discharge Instructions (Addendum)
You came to the hospital today to be evaluated for your head injury and headaches.  Your head physical exam was reassuring.  You did not need a CT scan performed today.    You can treat your headache with over-the-counter medications such as tylenol as needed. You may take up to 600 MG (3 pills) of normal strength ibuprofen every 8 hours as needed.  You make take tylenol, up to 1,000 mg (two extra strength pills) every 8 hours as needed.   It is safe to take ibuprofen and tylenol at the same time as they work differently.   Do not take more than 3,000 mg tylenol in a 24 hour period (not more than one dose every 8 hours.  Please check all medication labels as many medications such as pain and cold medications may contain tylenol.  Do not drink alcohol while taking these medications.  Do not take other NSAID'S while taking ibuprofen (such as aleve or naproxen).  Please take ibuprofen with food to decrease stomach upset.   Stay hydrated and get plenty of rest. Limit your screen time and complex thinking. Avoid any contact sports/activities to prevent re-injury to your head. Please follow-up with your primary care provider if symptoms do not improve.   Return to the ER if you develop severely worsening headache, changes in your vision, persistent vomiting, or new or concerning symptoms.

## 2020-06-29 ENCOUNTER — Ambulatory Visit: Payer: Self-pay | Admitting: Allergy & Immunology

## 2020-07-19 ENCOUNTER — Ambulatory Visit: Payer: Self-pay | Admitting: Allergy

## 2021-03-21 ENCOUNTER — Telehealth: Payer: Self-pay | Admitting: Cardiology

## 2021-03-21 ENCOUNTER — Other Ambulatory Visit: Payer: Self-pay

## 2021-03-21 NOTE — Telephone Encounter (Signed)
Pt c/o Shortness Of Breath: STAT if SOB developed within the last 24 hours or pt is noticeably SOB on the phone  1. Are you currently SOB (can you hear that pt is SOB on the phone)?  Yes   2. How long have you been experiencing SOB? A few weeks  3. Are you SOB when sitting or when up moving around? Moving around  4. Are you currently experiencing any other symptoms? Intermittent chest pain

## 2021-03-21 NOTE — Telephone Encounter (Signed)
Called patient to get more information. Patient reported having chest pain off and on, on the right side of his chest. He does have a history of having a cardiac stent in 2019 per the patient. The right sided chest pain usually occurs once per day. Patient is not currently having any chest pain or shortness of breath. Over the past two months he has been to the ER on multiple occasions and had x-rays of his chest both at [facility] and also at [facility]. Per the patient, they never find anything and I still have the pain. Discussed the case with Dr. Julien Nordmann nurse and an appointment was made for him to see Dr. Geraldo Pitter in the office this week.

## 2021-03-24 ENCOUNTER — Encounter: Payer: Self-pay | Admitting: Cardiology

## 2021-03-24 ENCOUNTER — Other Ambulatory Visit: Payer: Self-pay

## 2021-03-24 ENCOUNTER — Ambulatory Visit: Payer: BC Managed Care – PPO | Admitting: Cardiology

## 2021-03-24 VITALS — BP 152/72 | HR 87 | Ht 65.0 in | Wt 231.8 lb

## 2021-03-24 DIAGNOSIS — F330 Major depressive disorder, recurrent, mild: Secondary | ICD-10-CM

## 2021-03-24 DIAGNOSIS — I776 Arteritis, unspecified: Secondary | ICD-10-CM

## 2021-03-24 DIAGNOSIS — I701 Atherosclerosis of renal artery: Secondary | ICD-10-CM | POA: Insufficient documentation

## 2021-03-24 DIAGNOSIS — R079 Chest pain, unspecified: Secondary | ICD-10-CM | POA: Diagnosis not present

## 2021-03-24 DIAGNOSIS — F3340 Major depressive disorder, recurrent, in remission, unspecified: Secondary | ICD-10-CM

## 2021-03-24 DIAGNOSIS — F411 Generalized anxiety disorder: Secondary | ICD-10-CM | POA: Insufficient documentation

## 2021-03-24 DIAGNOSIS — I10 Essential (primary) hypertension: Secondary | ICD-10-CM

## 2021-03-24 DIAGNOSIS — M5126 Other intervertebral disc displacement, lumbar region: Secondary | ICD-10-CM | POA: Insufficient documentation

## 2021-03-24 HISTORY — DX: Morbid (severe) obesity due to excess calories: E66.01

## 2021-03-24 HISTORY — DX: Major depressive disorder, recurrent, mild: F33.0

## 2021-03-24 HISTORY — DX: Atherosclerosis of renal artery: I70.1

## 2021-03-24 HISTORY — DX: Other intervertebral disc displacement, lumbar region: M51.26

## 2021-03-24 HISTORY — DX: Major depressive disorder, recurrent, in remission, unspecified: F33.40

## 2021-03-24 HISTORY — DX: Arteritis, unspecified: I77.6

## 2021-03-24 HISTORY — DX: Generalized anxiety disorder: F41.1

## 2021-03-24 MED ORDER — NITROGLYCERIN 0.4 MG SL SUBL: 0.4000 mg | SUBLINGUAL_TABLET | SUBLINGUAL | 6 refills | Status: DC | PRN

## 2021-03-24 MED ORDER — METOPROLOL SUCCINATE ER 25 MG PO TB24: 25.0000 mg | ORAL_TABLET | Freq: Every day | ORAL | 3 refills | Status: DC

## 2021-03-24 NOTE — Patient Instructions (Signed)
Medication Instructions:  Your physician has recommended you make the following change in your medication:   Start Toprol XL 25 mg daily.  Use nitroglycerin 1 tablet placed under the tongue at the first sign of chest pain or an angina attack. 1 tablet may be used every 5 minutes as needed, for up to 15 minutes. Do not take more than 3 tablets in 15 minutes. If pain persist call 911 or go to the nearest ED.   *If you need a refill on your cardiac medications before your next appointment, please call your pharmacy*   Lab Work: None ordered If you have labs (blood work) drawn today and your tests are completely normal, you will receive your results only by: MyChart Message (if you have MyChart) OR A paper copy in the mail If you have any lab test that is abnormal or we need to change your treatment, we will call you to review the results.   Testing/Procedures: Your physician has requested that you have an echocardiogram. Echocardiography is a painless test that uses sound waves to create images of your heart. It provides your doctor with information about the size and shape of your heart and how well your hearts chambers and valves are working. This procedure takes approximately one hour. There are no restrictions for this procedure.    Follow-Up: At [facility], you and your health needs are our priority.  As part of our continuing mission to provide you with exceptional heart care, we have created designated Provider Care Teams.  These Care Teams include your primary Cardiologist (physician) and Advanced Practice Providers (APPs -  Physician Assistants and Nurse Practitioners) who all work together to provide you with the care you need, when you need it.  We recommend signing up for the patient portal called "MyChart".  Sign up information is provided on this After Visit Summary.  MyChart is used to connect with patients for Virtual Visits (Telemedicine).  Patients are able to view  lab/test results, encounter notes, upcoming appointments, etc.  Non-urgent messages can be sent to your provider as well.   To learn more about what you can do with MyChart, go to [email or web address].    Your next appointment:   6 month(s)  The format for your next appointment:   In Person  Provider:   Belva Crome, MD   Other Instructions Echocardiogram An echocardiogram is a test that uses sound waves (ultrasound) to produce images of the heart. Images from an echocardiogram can provide important information about: Heart size and shape. The size and thickness and movement of your heart's walls. Heart muscle function and strength. Heart valve function or if you have stenosis. Stenosis is when the heart valves are too narrow. If blood is flowing backward through the heart valves (regurgitation). A tumor or infectious growth around the heart valves. Areas of heart muscle that are not working well because of poor blood flow or injury from a heart attack. Aneurysm detection. An aneurysm is a weak or damaged part of an artery wall. The wall bulges out from the normal force of blood pumping through the body. Tell a health care provider about: Any allergies you have. All medicines you are taking, including vitamins, herbs, eye drops, creams, and over-the-counter medicines. Any blood disorders you have. Any surgeries you have had. Any medical conditions you have. Whether you are pregnant or may be pregnant. What are the risks? Generally, this is a safe test. However, problems may occur, including an allergic reaction to  dye (contrast) that may be used during the test. What happens before the test? No specific preparation is needed. You may eat and drink normally. What happens during the test? You will take off your clothes from the waist up and put on a hospital gown. Electrodes or electrocardiogram (ECG)patches may be placed on your chest. The electrodes or patches are then  connected to a device that monitors your heart rate and rhythm. You will lie down on a table for an ultrasound exam. A gel will be applied to your chest to help sound waves pass through your skin. A handheld device, called a transducer, will be pressed against your chest and moved over your heart. The transducer produces sound waves that travel to your heart and bounce back (or "echo" back) to the transducer. These sound waves will be captured in real-time and changed into images of your heart that can be viewed on a video monitor. The images will be recorded on a computer and reviewed by your health care provider. You may be asked to change positions or hold your breath for a short time. This makes it easier to get different views or better views of your heart. In some cases, you may receive contrast through an IV in one of your veins. This can improve the quality of the pictures from your heart. The procedure may vary among health care providers and hospitals.   What can I expect after the test? You may return to your normal, everyday life, including diet, activities, and medicines, unless your health care provider tells you not to do that. Follow these instructions at home: It is up to you to get the results of your test. Ask your health care provider, or the department that is doing the test, when your results will be ready. Keep all follow-up visits. This is important. Summary An echocardiogram is a test that uses sound waves (ultrasound) to produce images of the heart. Images from an echocardiogram can provide important information about the size and shape of your heart, heart muscle function, heart valve function, and other possible heart problems. You do not need to do anything to prepare before this test. You may eat and drink normally. After the echocardiogram is completed, you may return to your normal, everyday life, unless your health care provider tells you not to do that. This  information is not intended to replace advice given to you by your health care provider. Make sure you discuss any questions you have with your health care provider. Document Revised: 09/16/2019 Document Reviewed: 09/16/2019 Elsevier Patient Education  2021 [employer].

## 2021-03-24 NOTE — Progress Notes (Signed)
Cardiology Office Note:    Date:  03/24/2021   ID:  Luke Wade, DOB 12/29/1980, MRN [id number]  PCP:  Kristen Loader, FNP  Cardiologist:  Jenean Lindau, MD   Referring MD: Kristen Loader, FNP    ASSESSMENT:    1. Essential hypertension   2. Chest pain, rule out acute myocardial infarction   3. Vasculitis ([place])   4. Morbid obesity ([place])    PLAN:    In order of problems listed above:  Primary prevention stressed with the patient.  Importance of compliance with diet medication stressed any vocalized understanding.  He was advised to walk at least half an hour a day 5 days a week and he promises to do so. Essential hypertension: Blood pressure stable and mildly elevated.  He is monitored by his primary care and his hypertension specialist in [place]. Elevated heart rate: Patient mentions of elevated heart rate at times.  I initiated on him Toprol-XL 25 mg every morning.  He is going to keep a track of pulse blood pressure and get back to [place] in 2 weeks. Chest pain: Coronary angiography in 2019 was normal I told him that he could use nitroglycerin on a as needed basis.  Sublingual nitroglycerin prescription was sent, its protocol and 911 protocol explained and the patient vocalized understanding questions were answered to the patient's satisfaction Obesity dyslipidemia: Weight reduction stressed diet emphasized.  He vocalized understanding and promises to do better. Vasculitis: Followed by primary care Patient will be seen in follow-up appointment in 6 months or earlier if the patient has any concerns    Medication Adjustments/Labs and Tests Ordered: Current medicines are reviewed at length with the patient today.  Concerns regarding medicines are outlined above.  Orders Placed This Encounter  Procedures   EKG 12-Lead   ECHOCARDIOGRAM COMPLETE   Meds ordered this encounter  Medications   nitroGLYCERIN (NITROSTAT) 0.4 MG SL tablet    Sig: Place 1 tablet (0.4 mg total)  under the tongue every 5 (five) minutes as needed.    Dispense:  25 tablet    Refill:  6   metoprolol succinate (TOPROL XL) 25 MG 24 hr tablet    Sig: Take 1 tablet (25 mg total) by mouth daily.    Dispense:  90 tablet    Refill:  3     No chief complaint on file.    History of Present Illness:    Luke Wade is a 41 y.o. male.  Patient has past medical history of essential hypertension and obesity.  He has history of vasculitis.  He tells me that his blood pressure is monitored by his blood pressure doctor in [place].  He denies any chest pain orthopnea or PND.  He occasionally has chest tightness and this is not related to exertion.  At the time of my evaluation, the patient is alert awake oriented and in no distress.  Coronary angiography in 2019 revealed normal coronary arteries.  Past Medical History:  Diagnosis Date   Abnormal myocardial perfusion study 05/02/2017   Acid reflux 08/05/2018   Chest pain 04/16/2017   Chest pain, rule out acute myocardial infarction 04/16/2017   Essential hypertension 04/16/2017   Hypertension 04/16/2017   Overweight 04/16/2017    Past Surgical History:  Procedure Laterality Date   LEFT HEART CATH AND CORONARY ANGIOGRAPHY N/A 05/03/2017   Procedure: LEFT HEART CATH AND CORONARY ANGIOGRAPHY;  Surgeon: Leonie Man, MD;  Location: [facility];  Service: Cardiovascular;  Laterality: N/A;   NO PAST SURGERIES      Current Medications: Current Meds  Medication Sig   amLODipine (NORVASC) 10 MG tablet Take 10 mg by mouth daily.   aspirin EC 81 MG tablet Take 81 mg by mouth daily.   EPINEPHrine 0.3 mg/0.3 mL IJ SOAJ injection Inject 0.3 mg into the muscle as needed (for allergic reaction).    gabapentin (NEURONTIN) 300 MG capsule Take 300 mg by mouth daily.   indapamide (LOZOL) 1.25 MG tablet Take 1.25 mg by mouth every morning.   metoprolol succinate (TOPROL XL) 25 MG 24 hr tablet Take 1 tablet (25 mg total) by mouth daily.    nitroGLYCERIN (NITROSTAT) 0.4 MG SL tablet Place 1 tablet (0.4 mg total) under the tongue every 5 (five) minutes as needed.     Allergies:   Contrast media [iodinated contrast media]   Social History   Socioeconomic History   Marital status: Married    Spouse name: Not on file   Number of children: Not on file   Years of education: Not on file   Highest education level: Not on file  Occupational History   Not on file  Tobacco Use   Smoking status: Former    Packs/day: 0.25    Types: Cigarettes   Smokeless tobacco: Never  Substance and Sexual Activity   Alcohol use: Yes    Comment: occ   Drug use: Not Currently    Types: Marijuana   Sexual activity: Not on file  Other Topics Concern   Not on file  Social History Narrative   Not on file   Social Determinants of Health   Financial Resource Strain: Not on file  Food Insecurity: Not on file  Transportation Needs: Not on file  Physical Activity: Not on file  Stress: Not on file  Social Connections: Not on file     Family History: The patient's family history includes Cancer in his father and mother.  ROS:   Please see the history of present illness.    All other systems reviewed and are negative.  EKGs/Labs/Other Studies Reviewed:    The following studies were reviewed today: EKG reveals sinus rhythm and nonspecific ST-T changes   Recent Labs: No results found for requested labs within last 8760 hours.  Recent Lipid Panel    Component Value Date/Time   CHOL 184 05/03/2017 0701   TRIG 267 (H) 05/03/2017 0701   HDL 47 05/03/2017 0701   CHOLHDL 3.9 05/03/2017 0701   VLDL 53 (H) 05/03/2017 0701   LDLCALC 84 05/03/2017 0701    Physical Exam:    VS:  BP (!) 152/72    Pulse 87    Ht 5\' 5"  (1.651 m)    Wt 231 lb 12.8 oz (105.1 kg)    SpO2 94%    BMI 38.57 kg/m     Wt Readings from Last 3 Encounters:  03/24/21 231 lb 12.8 oz (105.1 kg)  02/08/20 230 lb (104.3 kg)  04/28/19 245 lb (111.1 kg)     GEN:  Patient is in no acute distress HEENT: Normal NECK: No JVD; No carotid bruits LYMPHATICS: No lymphadenopathy CARDIAC: Hear sounds regular, 2/6 systolic murmur at the apex. RESPIRATORY:  Clear to auscultation without rales, wheezing or rhonchi  ABDOMEN: Soft, non-tender, non-distended MUSCULOSKELETAL:  No edema; No deformity  SKIN: Warm and dry NEUROLOGIC:  Alert and oriented x 3 PSYCHIATRIC:  Normal affect   Signed, Garwin Brothers, MD  03/24/2021 9:46 AM  [facility]

## 2021-03-28 ENCOUNTER — Ambulatory Visit (HOSPITAL_BASED_OUTPATIENT_CLINIC_OR_DEPARTMENT_OTHER): Payer: BC Managed Care – PPO

## 2021-03-31 ENCOUNTER — Ambulatory Visit (HOSPITAL_BASED_OUTPATIENT_CLINIC_OR_DEPARTMENT_OTHER)
Admission: RE | Admit: 2021-03-31 | Discharge: 2021-03-31 | Disposition: A | Discharge status: Home / Self Care | Payer: BC Managed Care – PPO | Source: Ambulatory Visit | Attending: Cardiology | Admitting: Cardiology

## 2021-03-31 ENCOUNTER — Other Ambulatory Visit: Payer: Self-pay

## 2021-03-31 DIAGNOSIS — R079 Chest pain, unspecified: Secondary | ICD-10-CM

## 2021-03-31 MED ORDER — PERFLUTREN LIPID MICROSPHERE
1.0000 mL | INTRAVENOUS | Status: AC | PRN
  Administered 2021-03-31: 2 mL via INTRAVENOUS

## 2021-03-31 NOTE — Progress Notes (Signed)
°  Echocardiogram 2D Echocardiogram with contrast has been performed.  Roosvelt Maser F 03/31/2021, 4:11 PM

## 2021-04-13 LAB — ECHOCARDIOGRAM COMPLETE
Area-P 1/2: 4.17 cm2
S' Lateral: 2.4 cm

## 2021-04-14 ENCOUNTER — Telehealth: Payer: Self-pay

## 2021-04-14 DIAGNOSIS — R931 Abnormal findings on diagnostic imaging of heart and coronary circulation: Secondary | ICD-10-CM

## 2021-04-14 NOTE — Telephone Encounter (Signed)
-----   Message from Jenean Lindau, MD sent at 04/13/2021  2:10 PM EST ----- ?his report reveals that he could have abnormal hypertrophy and thickening of the heart.  Cardiac MRI is recommended.  Copy primary care ?Jenean Lindau, MD 04/13/2021 2:09 PM  ?

## 2021-04-14 NOTE — Telephone Encounter (Signed)
Results reviewed with pt as per Dr. Revankar's note.  Pt verbalized understanding and had no additional questions. Routed to PCP. MRI ordered. 

## 2021-04-18 ENCOUNTER — Telehealth (HOSPITAL_COMMUNITY): Payer: Self-pay | Admitting: Emergency Medicine

## 2021-04-18 NOTE — Telephone Encounter (Signed)
Reaching out to patient to offer assistance regarding upcoming cardiac imaging study; pt verbalizes understanding of appt date/time, parking situation and where to check in, pre-test NPO status and medications ordered, and verified current allergies; name and call back number provided for further questions should they arise ?Luke Alexandria RN Navigator Cardiac Imaging ?[facility] ?[phone] office ?[phone] cell ? ?Denies metal implants ?Denies iv issues ?Denies claustro ?Arrival [id number] ? ?

## 2021-04-19 ENCOUNTER — Ambulatory Visit (HOSPITAL_COMMUNITY)
Admission: RE | Admit: 2021-04-19 | Discharge: 2021-04-19 | Disposition: A | Discharge status: Home / Self Care | Payer: BC Managed Care – PPO | Source: Ambulatory Visit | Attending: Cardiology | Admitting: Cardiology

## 2021-04-19 ENCOUNTER — Other Ambulatory Visit: Payer: Self-pay

## 2021-04-19 DIAGNOSIS — R931 Abnormal findings on diagnostic imaging of heart and coronary circulation: Secondary | ICD-10-CM

## 2021-04-19 LAB — CBC WITH DIFFERENTIAL/PLATELET
Basophils Absolute: 0 10*3/uL (ref 0.0–0.2)
Basos: 0 %
EOS (ABSOLUTE): 0.2 10*3/uL (ref 0.0–0.4)
Eos: 2 %
Hematocrit: 43.4 % (ref 37.5–51.0)
Hemoglobin: 14.5 g/dL (ref 13.0–17.7)
Immature Grans (Abs): 0 10*3/uL (ref 0.0–0.1)
Immature Granulocytes: 1 %
Lymphocytes Absolute: 1.9 10*3/uL (ref 0.7–3.1)
Lymphs: 23 %
MCH: 25.3 pg — ABNORMAL LOW (ref 26.6–33.0)
MCHC: 33.4 g/dL (ref 31.5–35.7)
MCV: 76 fL — ABNORMAL LOW (ref 79–97)
Monocytes Absolute: 0.8 10*3/uL (ref 0.1–0.9)
Monocytes: 9 %
Neutrophils Absolute: 5.5 10*3/uL (ref 1.4–7.0)
Neutrophils: 65 %
Platelets: 292 10*3/uL (ref 150–450)
RBC: 5.74 x10E6/uL (ref 4.14–5.80)
RDW: 13.8 % (ref 11.6–15.4)
WBC: 8.3 10*3/uL (ref 3.4–10.8)

## 2021-04-19 IMAGING — MR MR CARD MORPHOLOGY WO/W CM
45 of 48 series · 45 of 48 positions shown · IV contrast (Contrast agent)
Comparison: none

CLINICAL DATA: LVH,?Hypertrophic cardiomyopathy

EXAM:
CARDIAC MRI
TECHNIQUE: The patient was scanned on a 1.5 Tesla GE magnet. A dedicated
cardiac coil was used. Functional imaging was done using Fiesta
sequences. [DATE], and 4 chamber views were done to assess for RWMA's.
Modified SHAJAMAN rule using a short axis stack was used to
calculate an ejection fraction on a dedicated work station using
Circle software. The patient received 10 cc of Gadavist. After 10
minutes inversion recovery sequences were used to assess for
infiltration and scar tissue.
CONTRAST:  Gadavist 10 cc

[Series 4: [id number]_bh · axial · 8.0mm · 1.56mm/px · 1 of 20 slices shown]
[im 1/20]
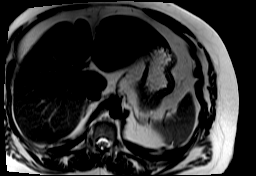

[Series 8: bSSFP · oblique · 8.0mm · 1.88mm/px · 1 of 25 slices shown (1 of 22)]
[im 1/25]
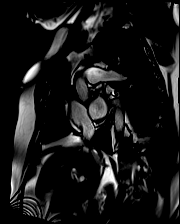

[Series 9: bSSFP · oblique · 8.0mm · 1.96mm/px · 1 of 25 slices shown (2 of 22)]
[im 1/25]
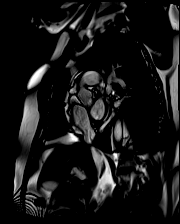

[Series 10: bSSFP · oblique · 8.0mm · 1.96mm/px · 1 of 25 slices shown (3 of 22)]
[im 1/25]
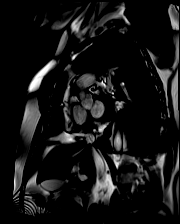

[Series 11: bSSFP · oblique · 8.0mm · 1.96mm/px · 1 of 25 slices shown (4 of 22)]
[im 1/25]
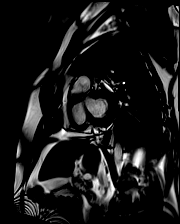

[Series 12: bSSFP · oblique · 8.0mm · 1.96mm/px · 1 of 25 slices shown (5 of 22)]
[im 1/25]
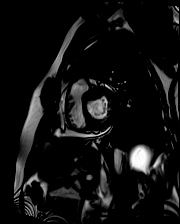

[Series 13: bSSFP · oblique · 8.0mm · 1.96mm/px · 1 of 25 slices shown (6 of 22)]
[im 1/25]
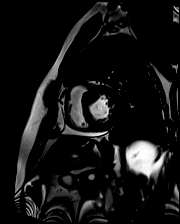

[Series 14: bSSFP · oblique · 8.0mm · 1.96mm/px · 1 of 25 slices shown (7 of 22)]
[im 1/25]
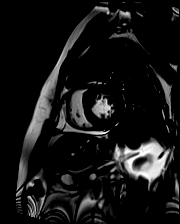

[Series 15: bSSFP · oblique · 8.0mm · 1.96mm/px · 1 of 25 slices shown (8 of 22)]
[im 1/25]
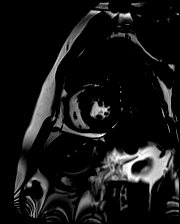

[Series 16: bSSFP · oblique · 8.0mm · 1.96mm/px · 1 of 25 slices shown (9 of 22)]
[im 1/25]
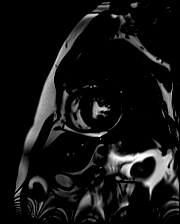

[Series 17: bSSFP · oblique · 8.0mm · 1.96mm/px · 1 of 25 slices shown (10 of 22)]
[im 1/25]
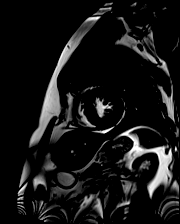

[Series 18: bSSFP · oblique · 8.0mm · 1.96mm/px · 1 of 25 slices shown (11 of 22)]
[im 1/25]
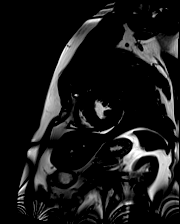

[Series 19: bSSFP · oblique · 8.0mm · 1.96mm/px · 1 of 25 slices shown (12 of 22)]
[im 1/25]
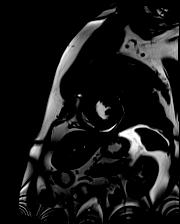

[Series 20: bSSFP · oblique · 8.0mm · 1.96mm/px · 1 of 25 slices shown (13 of 22)]
[im 1/25]
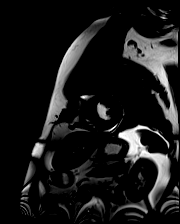

[Series 21: bSSFP · oblique · 8.0mm · 1.96mm/px · 1 of 25 slices shown (14 of 22)]
[im 1/25]
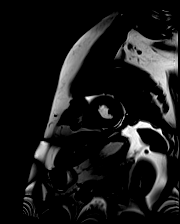

[Series 22: bSSFP · oblique · 8.0mm · 1.96mm/px · 1 of 25 slices shown (15 of 22)]
[im 1/25]
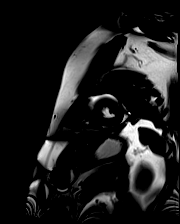

[Series 23: bSSFP · oblique · 8.0mm · 1.96mm/px · 1 of 25 slices shown (16 of 22)]
[im 1/25]
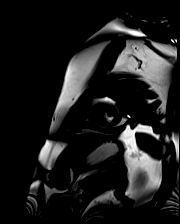

[Series 24: bSSFP · oblique · 8.0mm · 1.96mm/px · 1 of 25 slices shown (17 of 22)]
[im 1/25]
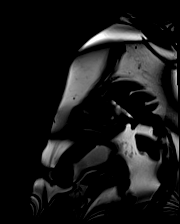

[Series 25: bSSFP · oblique · 8.0mm · 1.96mm/px · 1 of 25 slices shown (18 of 22)]
[im 1/25]
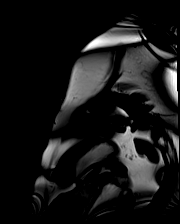

[Series 26: bSSFP · oblique · 8.0mm · 1.96mm/px · 1 of 25 slices shown (19 of 22)]
[im 1/25]
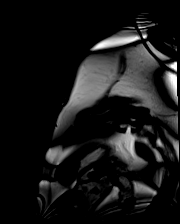

[Series 27: bSSFP · coronal · 6.0mm · 1.41mm/px · 1 of 25 slices shown (20 of 22)]
[im 1/25]
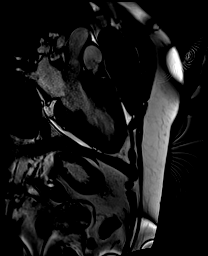

[Series 28: bSSFP · coronal · 6.0mm · 1.41mm/px · 1 of 25 slices shown (21 of 22)]
[im 1/25]
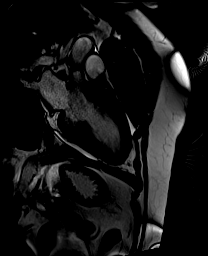

[Series 29: bSSFP · axial · 6.0mm · 1.41mm/px · 1 of 25 slices shown (22 of 22)]
[im 1/25]
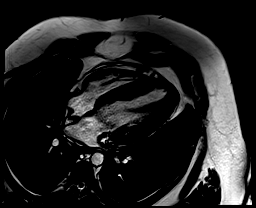

[Series 30: cine_trufi_cs_rt_short axis · oblique · 8.0mm · 2.02mm/px · 1 of 15 slices shown (1 of 20)]
[im 1/15]
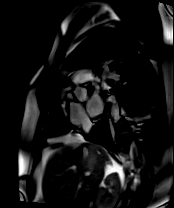

[Series 30: cine_trufi_cs_rt_short axis · oblique · 8.0mm · 2.02mm/px · 1 of 15 slices shown (2 of 20)]
[im 1/15]
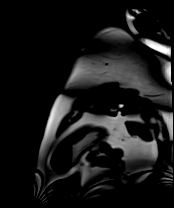

[Series 30: cine_trufi_cs_rt_short axis · oblique · 8.0mm · 2.02mm/px · 1 of 15 slices shown (3 of 20)]
[im 1/15]
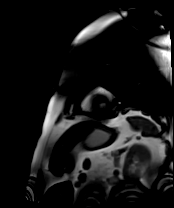

[Series 30: cine_trufi_cs_rt_short axis · oblique · 8.0mm · 2.02mm/px · 1 of 15 slices shown (4 of 20)]
[im 1/15]
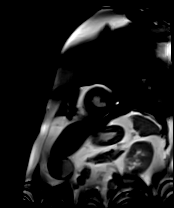

[Series 30: cine_trufi_cs_rt_short axis · oblique · 8.0mm · 2.02mm/px · 1 of 15 slices shown (5 of 20)]
[im 1/15]
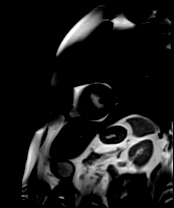

[Series 30: cine_trufi_cs_rt_short axis · oblique · 8.0mm · 2.02mm/px · 1 of 15 slices shown (6 of 20)]
[im 1/15]
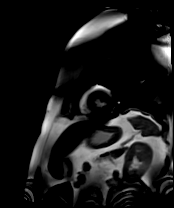

[Series 30: cine_trufi_cs_rt_short axis · oblique · 8.0mm · 2.02mm/px · 1 of 15 slices shown (7 of 20)]
[im 1/15]
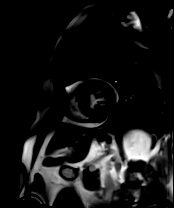

[Series 30: cine_trufi_cs_rt_short axis · oblique · 8.0mm · 2.02mm/px · 1 of 15 slices shown (8 of 20)]
[im 1/15]
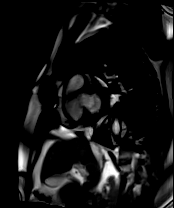

[Series 30: cine_trufi_cs_rt_short axis · oblique · 8.0mm · 2.02mm/px · 1 of 15 slices shown (9 of 20)]
[im 1/15]
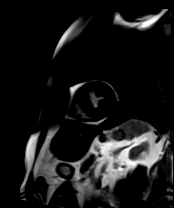

[Series 30: cine_trufi_cs_rt_short axis · oblique · 8.0mm · 2.02mm/px · 1 of 15 slices shown (10 of 20)]
[im 1/15]
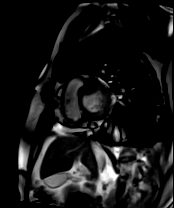

[Series 30: cine_trufi_cs_rt_short axis · oblique · 8.0mm · 2.02mm/px · 1 of 15 slices shown (11 of 20)]
[im 1/15]
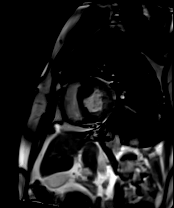

[Series 30: cine_trufi_cs_rt_short axis · oblique · 8.0mm · 2.02mm/px · 1 of 15 slices shown (12 of 20)]
[im 1/15]
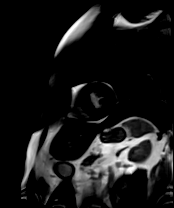

[Series 30: cine_trufi_cs_rt_short axis · oblique · 8.0mm · 2.02mm/px · 1 of 15 slices shown (13 of 20)]
[im 1/15]
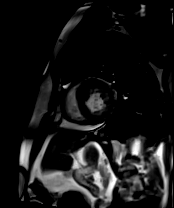

[Series 30: cine_trufi_cs_rt_short axis · oblique · 8.0mm · 2.02mm/px · 1 of 15 slices shown (14 of 20)]
[im 1/15]
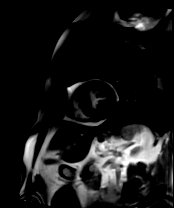

[Series 30: cine_trufi_cs_rt_short axis · oblique · 8.0mm · 2.02mm/px · 1 of 15 slices shown (15 of 20)]
[im 1/15]
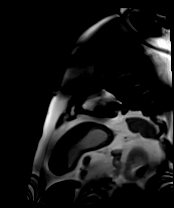

[Series 30: cine_trufi_cs_rt_short axis · oblique · 8.0mm · 2.02mm/px · 1 of 15 slices shown (16 of 20)]
[im 1/15]
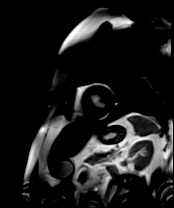

[Series 30: cine_trufi_cs_rt_short axis · oblique · 8.0mm · 2.02mm/px · 1 of 15 slices shown (17 of 20)]
[im 1/15]
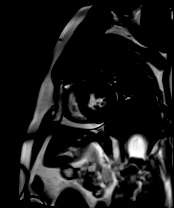

[Series 30: cine_trufi_cs_rt_short axis · oblique · 8.0mm · 2.02mm/px · 1 of 15 slices shown (18 of 20)]
[im 1/15]
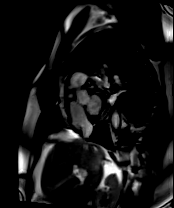

[Series 30: cine_trufi_cs_rt_short axis · oblique · 8.0mm · 2.02mm/px · 1 of 15 slices shown (19 of 20)]
[im 1/15]
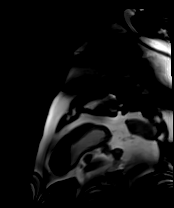

[Series 30: cine_trufi_cs_rt_short axis · oblique · 8.0mm · 2.02mm/px · 1 of 15 slices shown (20 of 20)]
[im 1/15]
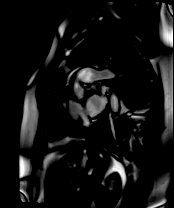

[Series 31: (id)_long_t1 · oblique · 8.0mm · 1.56mm/px · 1 of 24 slices shown]
[im 1/24]
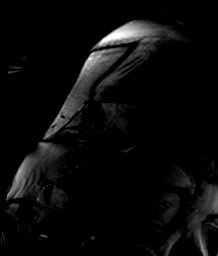

[Series 32: (id)_long_t1_moco · oblique · 8.0mm · 1.56mm/px · 1 of 24 slices shown]
[im 1/24]
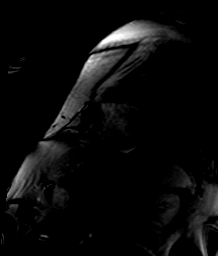

[45 of 48 positions shown; findings below may reference images not displayed]

FINDINGS: Limited images of the lung fields showed no gross abnormalities.

Small circumferential pericardial effusion. Normal left ventricular
size. The mid to apical LV segments are moderately hypertrophied.
Turbulent flow in the mid ventricle suggesting mid-cavity gradient.
Normal wall motion, LV EF 55% when calculated but looks higher
visually. No mitral valve systolic anterior motion. Normal right
ventricular size and systolic function, EF 54%. Normal left and
right atrial sizes. Trileaflet aortic valve, no significant
regurgitation or stenosis. No significant mitral regurgitation
noted.

On delayed enhancement imaging, there was no bright late gadolinium
enhancement (LGE). There was some subtle mid-wall LGE in the apical
lateral wall.

MEASUREMENTS:
MEASUREMENTS
LVEDV 168 mL

LVSV 93 mL
LVEF 55%

RVEDV 128 mL
RVSV 69 mL
RVEF 54%

T1 [CK], ECV 26%
IMPRESSION: 1.  Small circumferential pericardial effusion.

2. Normal LV size with moderate LV hypertrophy involving the mid to
apical LV segments with signs of a mid-cavity LV gradient. EF 55%.

3.  Normal RV size and systolic function.

4.  Possible subtle mid-wall LGE in the apical lateral wall.

5. T1 findings within normal limits (extracellular volume percentage
normal range).

This study could be consistent with a mid-apical variant
hypertrophic cardiomyopathy. There is not prominent LGE present.
Cannot rule out long-standing severe HTN as a cause for the LV
changes if this has been present.

SHAJAMAN

## 2021-04-19 MED ORDER — GADOBUTROL 1 MMOL/ML IV SOLN
10.0000 mL | Freq: Once | INTRAVENOUS | Status: AC | PRN
  Administered 2021-04-19: 10 mL via INTRAVENOUS

## 2021-04-21 ENCOUNTER — Telehealth: Payer: Self-pay

## 2021-04-21 NOTE — Telephone Encounter (Signed)
-----   Message from Garwin Brothers, MD sent at 04/21/2021  2:09 PM EDT ----- ?Please schedule appointment in the next week or 2 for follow-up appointment to discuss the findings.  In the interim put a 2-week monitor.  Preferably I would like to see him after the monitor results. ?----- Message ----- ?From: Interface, Rad Results In ?Sent: 04/20/2021   2:09 PM EDT ?To: Garwin Brothers, MD ? ? ?

## 2021-04-22 ENCOUNTER — Other Ambulatory Visit: Payer: Self-pay

## 2021-04-22 ENCOUNTER — Ambulatory Visit (INDEPENDENT_AMBULATORY_CARE_PROVIDER_SITE_OTHER): Payer: BC Managed Care – PPO

## 2021-04-22 DIAGNOSIS — R002 Palpitations: Secondary | ICD-10-CM

## 2021-05-11 ENCOUNTER — Other Ambulatory Visit: Payer: Self-pay

## 2021-05-18 ENCOUNTER — Ambulatory Visit (HOSPITAL_COMMUNITY): Payer: BC Managed Care – PPO

## 2021-05-18 ENCOUNTER — Ambulatory Visit: Payer: BC Managed Care – PPO | Admitting: Cardiology

## 2021-05-18 ENCOUNTER — Encounter: Payer: Self-pay | Admitting: Cardiology

## 2021-05-18 VITALS — BP 154/80 | HR 82 | Ht 65.0 in | Wt 234.0 lb

## 2021-05-18 DIAGNOSIS — I421 Obstructive hypertrophic cardiomyopathy: Secondary | ICD-10-CM

## 2021-05-18 DIAGNOSIS — I4729 Other ventricular tachycardia: Secondary | ICD-10-CM

## 2021-05-18 DIAGNOSIS — I701 Atherosclerosis of renal artery: Secondary | ICD-10-CM | POA: Diagnosis not present

## 2021-05-18 DIAGNOSIS — I4719 Other supraventricular tachycardia: Secondary | ICD-10-CM | POA: Insufficient documentation

## 2021-05-18 DIAGNOSIS — I10 Essential (primary) hypertension: Secondary | ICD-10-CM | POA: Diagnosis not present

## 2021-05-18 HISTORY — DX: Other ventricular tachycardia: I47.29

## 2021-05-18 MED ORDER — METOPROLOL SUCCINATE ER 50 MG PO TB24: 50.0000 mg | ORAL_TABLET | Freq: Every day | ORAL | 3 refills | Status: DC

## 2021-05-18 NOTE — Progress Notes (Signed)
?Cardiology Office Note:   ? ?Date:  05/18/2021  ? ?ID:  Luke Wade, DOB Jun 28, 1980, MRN [id number] ? ?PCP:  Soundra Pilon, FNP  ?Cardiologist:  Garwin Brothers, MD  ? ?Referring MD: Soundra Pilon, FNP  ? ? ?ASSESSMENT:   ? ?1. Obstructive hypertrophic cardiomyopathy (HCC)   ?2. Essential hypertension   ?3. Renal artery stenosis (HCC)   ?4. Morbid obesity (HCC)   ?5. NSVT (nonsustained ventricular tachycardia) (HCC)   ? ?PLAN:   ? ?In order of problems listed above: ? ?Primary prevention stressed with patient.  Importance of compliance with diet medication stressed and he vocalized understanding. ?Syncopal: This happened in the setting of significant alcohol use.  He has never had a syncopal episode other than this. ?Non concentric hypertrophy of the left ventricular with nonsustained ventricular tachycardia: Consult has been placed for the possibility of a variant of hypertrophic cardiomyopathy.  In view of this I will refer him to our specialist colleague for further evaluation.  I reviewed MRI report with the patient at length and questions were answered to his satisfaction. ?Nonsustained ventricular tachycardia: I have doubled up his Toprol to 50 mg daily and discussed this with him at length.  Beta-blockers cause some degree of erectile dysfunction issues with him.  He will address this with her colleague Dr. Raynelle Jan for further evaluation and needful. ?Patient will be seen in follow-up appointment in 6 months or earlier if the patient has any concerns ? ? ? ?Medication Adjustments/Labs and Tests Ordered: ?Current medicines are reviewed at length with the patient today.  Concerns regarding medicines are outlined above.  ?Orders Placed This Encounter  ?Procedures  ? Ambulatory referral to Cardiology  ? ?Meds ordered this encounter  ?Medications  ? metoprolol succinate (TOPROL XL) 50 MG 24 hr tablet  ?  Sig: Take 1 tablet (50 mg total) by mouth daily.  ?  Dispense:  90 tablet  ?  Refill:  3   ? ? ? ?Chief Complaint  ?Patient presents with  ? Follow-up  ?  ? ?History of Present Illness:   ? ?Luke Wade is a 41 y.o. male.  Patient has past medical history of essential hypertension, obesity and known concentric hypertrophy of the left ventricle which is of concern.  His coronary angiography done a couple of years ago was unremarkable.  Event monitor has revealed nonsustained ventricular tachycardia.  The patient mentions to me that several months ago he had a passing out spell but he had significant alcohol use before the spell.  Otherwise he has never had such issue.  No chest pain orthopnea or PND.  At the time of my evaluation, the patient is alert awake oriented and in no distress. ? ?Past Medical History:  ?Diagnosis Date  ? Abnormal myocardial perfusion study 05/02/2017  ? Chest pain, rule out acute myocardial infarction 04/16/2017  ? Displacement of lumbar intervertebral disc without myelopathy 03/24/2021  ? Essential hypertension 04/16/2017  ? Gastroesophageal reflux disease without esophagitis 08/05/2018  ? Generalized anxiety disorder 03/24/2021  ? Mild recurrent major depression (HCC) 03/24/2021  ? Morbid obesity (HCC) 03/24/2021  ? Overweight 04/16/2017  ? Recurrent major depression in remission (HCC) 03/24/2021  ? Renal artery stenosis (HCC) 03/24/2021  ? Vasculitis (HCC) 03/24/2021  ? ? ?Past Surgical History:  ?Procedure Laterality Date  ? LEFT HEART CATH AND CORONARY ANGIOGRAPHY N/A 05/03/2017  ? Procedure: LEFT HEART CATH AND CORONARY ANGIOGRAPHY;  Surgeon: Marykay Lex, MD;  Location: [facility] INVASIVE CV  LAB;  Service: Cardiovascular;  Laterality: N/A;  ? NO PAST SURGERIES    ? ? ?Current Medications: ?Current Meds  ?Medication Sig  ? amLODipine (NORVASC) 10 MG tablet Take 10 mg by mouth daily.  ? aspirin EC 81 MG tablet Take 81 mg by mouth daily.  ? gabapentin (NEURONTIN) 300 MG capsule Take 300 mg by mouth daily as needed (pain).  ? indapamide (LOZOL) 1.25 MG tablet Take 1.25 mg by mouth  every morning.  ? nitroGLYCERIN (NITROSTAT) 0.4 MG SL tablet Place 0.4 mg under the tongue every 5 (five) minutes as needed for chest pain.  ? [DISCONTINUED] metoprolol succinate (TOPROL XL) 25 MG 24 hr tablet Take 1 tablet (25 mg total) by mouth daily.  ?  ? ?Allergies:   Contrast media [iodinated contrast media]  ? ?Social History  ? ?Socioeconomic History  ? Marital status: Married  ?  Spouse name: Not on file  ? Number of children: Not on file  ? Years of education: Not on file  ? Highest education level: Not on file  ?Occupational History  ? Not on file  ?Tobacco Use  ? Smoking status: Former  ?  Packs/day: 0.25  ?  Types: Cigarettes  ? Smokeless tobacco: Never  ?Vaping Use  ? Vaping Use: Some days  ?Substance and Sexual Activity  ? Alcohol use: Yes  ?  Comment: occ  ? Drug use: Not Currently  ?  Types: Marijuana  ? Sexual activity: Not on file  ?Other Topics Concern  ? Not on file  ?Social History Narrative  ? Not on file  ? ?Social Determinants of Health  ? ?Financial Resource Strain: Not on file  ?Food Insecurity: Not on file  ?Transportation Needs: Not on file  ?Physical Activity: Not on file  ?Stress: Not on file  ?Social Connections: Not on file  ?  ? ?Family History: ?The patient's family history includes Cancer in his father and mother. ? ?ROS:   ?Please see the history of present illness.    ?All other systems reviewed and are negative. ? ?EKGs/Labs/Other Studies Reviewed:   ? ?The following studies were reviewed today: ?Results of the MRI were discussed with the patient at length. ? ? ?Recent Labs: ?04/18/2021: Hemoglobin 14.5; Platelets 292  ?Recent Lipid Panel ?   ?Component Value Date/Time  ? CHOL 184 05/03/2017 0701  ? TRIG 267 (H) 05/03/2017 0701  ? HDL 47 05/03/2017 0701  ? CHOLHDL 3.9 05/03/2017 0701  ? VLDL 53 (H) 05/03/2017 0701  ? LDLCALC 84 05/03/2017 0701  ? ? ?Physical Exam:   ? ?VS:  BP (!) 154/80 (BP Location: Right Arm, Patient Position: Sitting, Cuff Size: Normal)   Pulse 82   Ht 5'  5" (1.651 m)   Wt 234 lb (106.1 kg)   SpO2 96%   BMI 38.94 kg/m?    ? ?Wt Readings from Last 3 Encounters:  ?05/18/21 234 lb (106.1 kg)  ?03/24/21 231 lb 12.8 oz (105.1 kg)  ?02/08/20 230 lb (104.3 kg)  ?  ? ?GEN: Patient is in no acute distress ?HEENT: Normal ?NECK: No JVD; No carotid bruits ?LYMPHATICS: No lymphadenopathy ?CARDIAC: Hear sounds regular, 2/6 systolic murmur at the apex. ?RESPIRATORY:  Clear to auscultation without rales, wheezing or rhonchi  ?ABDOMEN: Soft, non-tender, non-distended ?MUSCULOSKELETAL:  No edema; No deformity  ?SKIN: Warm and dry ?NEUROLOGIC:  Alert and oriented x 3 ?PSYCHIATRIC:  Normal affect  ? ?Signed, ?Garwin Brothers, MD  ?05/18/2021 9:35 AM    ?[facility]  ?

## 2021-05-18 NOTE — Patient Instructions (Signed)
Medication Instructions:  ?Your physician has recommended you make the following change in your medication:  ? ?Increase your Metoprolol to 50 mg daily. Double your current RX and I will send a new RX for a 50 mg tablet. ? ?*If you need a refill on your cardiac medications before your next appointment, please call your pharmacy* ? ? ?Lab Work: ?None ordered ?If you have labs (blood work) drawn today and your tests are completely normal, you will receive your results only by: ?MyChart Message (if you have MyChart) OR ?A paper copy in the mail ?If you have any lab test that is abnormal or we need to change your treatment, we will call you to review the results. ? ? ?Testing/Procedures: ?None ordered ? ? ?Follow-Up: ?At [facility], you and your health needs are our priority.  As part of our continuing mission to provide you with exceptional heart care, we have created designated Provider Care Teams.  These Care Teams include your primary Cardiologist (physician) and Advanced Practice Providers (APPs -  Physician Assistants and Nurse Practitioners) who all work together to provide you with the care you need, when you need it. ? ?We recommend signing up for the patient portal called "MyChart".  Sign up information is provided on this After Visit Summary.  MyChart is used to connect with patients for Virtual Visits (Telemedicine).  Patients are able to view lab/test results, encounter notes, upcoming appointments, etc.  Non-urgent messages can be sent to your provider as well.   ?To learn more about what you can do with MyChart, go to [email or web address].   ? ?Your next appointment:   ?6 month(s) ? ?The format for your next appointment:   ?In Person ? ?Provider:   ?Jyl Heinz, MD ? ? ?Other Instructions ?NA ? ?

## 2021-05-23 NOTE — Progress Notes (Signed)
?Cardiology Office Note:   ? ?Date:  05/24/2021  ? ?ID:  Luke Wade, DOB Sep 20, 1980, MRN [id number] ? ?PCP:  Soundra PilonBrake, Andrew R, FNP ?  ?[facility] Providers ?Cardiologist:  Norman HerrlichBrian Munley, MD    ? ?Referring MD: Garwin Brothersevankar, Rajan R, MD  ? ?CC: Feeling ok ?Consulted for the evaluation of HOCM at the behest of our larger [facility] ? ?History of Present Illness:   ? ?Luke Wade is a 41 y.o. male with a hx of Morbid obesity HTN, OSA on CPAP, Prior syncope (thought to be in the setting of alcohol use) with evidence of NSVT on heart monitor and hypertrophic cardiomyopathy. ? ?Patient notes that he has had high blood pressure for years.  Notes that he has never had good control: BP at home in 165/95 ? ?Patient notes at rest SOB at rest and  DOE. 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 ?Notes  palpitations ?Notes that he has exertional CP (this also can happy during sexual activity) ?Notes  Dizziness- . ?Notes that was related to passing out after having a few drinks. ?Notable family events include no SCD. ?   ? ?2019: Abnormal Stress test with normal cath, hyperdynamic LV on LV-gram. ?EKG is LVH With secondary repolarization. ?Echo- peak gradient ~ 100 mmHg ?CMR- Sparse LGE thickness 22 mm (anteroseptum) ? ? ?Past Medical History:  ?Diagnosis Date  ? Abnormal myocardial perfusion study 05/02/2017  ? Chest pain, rule out acute myocardial infarction 04/16/2017  ? Displacement of lumbar intervertebral disc without myelopathy 03/24/2021  ? Essential hypertension 04/16/2017  ? Gastroesophageal reflux disease without esophagitis 08/05/2018  ? Generalized anxiety disorder 03/24/2021  ? Mild recurrent major depression (HCC) 03/24/2021  ? Morbid obesity (HCC) 03/24/2021  ? Overweight 04/16/2017  ? Recurrent major depression in remission (HCC) 03/24/2021  ? Renal artery stenosis (HCC) 03/24/2021  ? Vasculitis (HCC) 03/24/2021  ? ? ?Past Surgical History:  ?Procedure Laterality Date   ? LEFT HEART CATH AND CORONARY ANGIOGRAPHY N/A 05/03/2017  ? Procedure: LEFT HEART CATH AND CORONARY ANGIOGRAPHY;  Surgeon: Marykay LexHarding, David W, MD;  [facility];  Service: Cardiovascular;  Laterality: N/A;  ? NO PAST SURGERIES    ? ? ?Current Medications: ?Current Meds  ?Medication Sig  ? amLODipine (NORVASC) 10 MG tablet Take 10 mg by mouth daily.  ? aspirin EC 81 MG tablet Take 81 mg by mouth daily.  ? gabapentin (NEURONTIN) 300 MG capsule Take 300 mg by mouth daily as needed (pain).  ? indapamide (LOZOL) 1.25 MG tablet Take 1.25 mg by mouth every morning.  ? metoprolol succinate (TOPROL XL) 50 MG 24 hr tablet Take 1 tablet (50 mg total) by mouth daily.  ? nitroGLYCERIN (NITROSTAT) 0.4 MG SL tablet Place 0.4 mg under the tongue every 5 (five) minutes as needed for chest pain.  ? spironolactone (ALDACTONE) 25 MG tablet Take 1 tablet (25 mg total) by mouth daily.  ?  ? ?Allergies:   Contrast media [iodinated contrast media]  ? ?Social History  ? ?Socioeconomic History  ? Marital status: Married  ?  Spouse name: Not on file  ? Number of children: Not on file  ? Years of education: Not on file  ? Highest education level: Not on file  ?Occupational History  ? Not on file  ?Tobacco Use  ? Smoking status: Former  ?  Packs/day: 0.25  ?  Types: Cigarettes  ? Smokeless tobacco: Never  ?  Vaping Use  ? Vaping Use: Some days  ?Substance and Sexual Activity  ? Alcohol use: Yes  ?  Comment: occ  ? Drug use: Not Currently  ?  Types: Marijuana  ? Sexual activity: Not on file  ?Other Topics Concern  ? Not on file  ?Social History Narrative  ? Not on file  ? ?Social Determinants of Health  ? ?Financial Resource Strain: Not on file  ?Food Insecurity: Not on file  ?Transportation Needs: Not on file  ?Physical Activity: Not on file  ?Stress: Not on file  ?Social Connections: Not on file  ?  ? ?Family History: ?The patient's family history includes Cancer in his father and mother. ?Mother died of cancer. ? ?ROS:   ?Please  see the history of present illness.    ? All other systems reviewed and are negative. ? ?EKGs/Labs/Other Studies Reviewed:   ? ?The following studies were reviewed today: ? ?Recent Labs: ?04/18/2021: Hemoglobin 14.5; Platelets 292  ?Recent Lipid Panel ?   ?Component Value Date/Time  ? CHOL 184 05/03/2017 0701  ? TRIG 267 (H) 05/03/2017 0701  ? HDL 47 05/03/2017 0701  ? CHOLHDL 3.9 05/03/2017 0701  ? VLDL 53 (H) 05/03/2017 0701  ? LDLCALC 84 05/03/2017 0701  ? ? ?    ? ?Physical Exam:   ? ?VS:  BP (!) 149/85   Pulse 72   Ht 5\' 5"  (1.651 m)   Wt 233 lb (105.7 kg)   SpO2 98%   BMI 38.77 kg/m?    ? ?Wt Readings from Last 3 Encounters:  ?05/24/21 233 lb (105.7 kg)  ?05/18/21 234 lb (106.1 kg)  ?03/24/21 231 lb 12.8 oz (105.1 kg)  ?  ? ?GEN:  Well nourished, well developed in no acute distress ?HEENT: Normal ?NECK: No JVD; No carotid bruits ?LYMPHATICS: No lymphadenopathy ?CARDIAC: RRR, no murmurs at rest but induced with standing or handgrip, rubs, gallops ?RESPIRATORY:  Clear to auscultation without rales, wheezing or rhonchi  ?ABDOMEN: Soft, non-tender, non-distended ?MUSCULOSKELETAL:  No edema; No deformity  ?SKIN: Warm and dry ?NEUROLOGIC:  Alert and oriented x 3 ?PSYCHIATRIC:  Normal affect  ? ?ASSESSMENT:   ? ?1. Obstructive hypertrophic cardiomyopathy (HCC)   ? ?PLAN:   ? ?Hypertrophic Cardiomyopathy ?With HTN, Morbid Obesity, widely patent CORS ?- neural septal variant; I cannot exclude a component of his disease related to long standing HTN,  ?- peak gradient 100 mmHg on 03/31/21 Echo (appears to be mid cavitary) ?- NYHA II-II ?- adding aldactone 25 mg PO daily; BMP in two weeks ?- his succinate was just increased to 50 mg; if he is still symptomatic during lab check ? Will increase to 100 mg PO daily ? ?- No Fhx, at next visit we will discuss additional family risks ? ?- CMR from 2023 notable for septal thickness of 22 mm, LGE < 15% ?- SCD risk is ~ 5.5% (rare NSVT on monitor) ?- Based on the SCD risk  factors present, this patient has a Class 2B indication for an ICD (may be considered) ?- we have reviewed this, unless new sx we will re-address this at next visit prior to EP consult ? ?- absence of atrial fibrillation ? ?- we have given him patient information through the [facility]. ? ?- will get limited echo here and then follow up with me (same day) in 3-4 months ? ?Time Spent Directly with Patient: ?  ?I have spent a total of 40 minutes with the patient reviewing notes, imaging, EKGs,  labs and examining the patient as well as establishing an assessment and plan that was discussed personally with the patient.  > 50% of time was spent in direct patient care. ? ? ? ?   ? ? ?Medication Adjustments/Labs and Tests Ordered: ?Current medicines are reviewed at length with the patient today.  Concerns regarding medicines are outlined above.  ?Orders Placed This Encounter  ?Procedures  ? Basic metabolic panel  ? ECHOCARDIOGRAM LIMITED  ? ?Meds ordered this encounter  ?Medications  ? spironolactone (ALDACTONE) 25 MG tablet  ?  Sig: Take 1 tablet (25 mg total) by mouth daily.  ?  Dispense:  90 tablet  ?  Refill:  3  ? ? ?Patient Instructions  ?Medication Instructions:  ?Your physician has recommended you make the following change in your medication:  ?START: spironolactone (Aldactone) 25 mg by mouth once daily ? ?*If you need a refill on your cardiac medications before your next appointment, please call your pharmacy* ? ? ?Lab Work: ?IN 2 WEEKS in [place] office: BMP ? ?If you have labs (blood work) drawn today and your tests are completely normal, you will receive your results only by: ?MyChart Message (if you have MyChart) OR ?A paper copy in the mail ?If you have any lab test that is abnormal or we need to change your treatment, we will call you to review the results. ? ? ?Testing/Procedures: ?July-Aug 2023: Your physician has requested that you have an echocardiogram. Echocardiography is a painless test that uses sound  waves to create images of your heart. It provides your doctor with information about the size and shape of your heart and how well your heart?s chambers and valves are working. This procedure takes approximatel

## 2021-05-24 ENCOUNTER — Encounter: Payer: Self-pay | Admitting: Internal Medicine

## 2021-05-24 ENCOUNTER — Ambulatory Visit: Payer: BC Managed Care – PPO | Admitting: Internal Medicine

## 2021-05-24 VITALS — BP 149/85 | HR 72 | Ht 65.0 in | Wt 233.0 lb

## 2021-05-24 DIAGNOSIS — I421 Obstructive hypertrophic cardiomyopathy: Secondary | ICD-10-CM

## 2021-05-24 HISTORY — DX: Obstructive hypertrophic cardiomyopathy: I42.1

## 2021-05-24 MED ORDER — SPIRONOLACTONE 25 MG PO TABS: 25.0000 mg | ORAL_TABLET | Freq: Every day | ORAL | 3 refills | Status: DC

## 2021-05-24 NOTE — Patient Instructions (Signed)
Medication Instructions:  ?Your physician has recommended you make the following change in your medication:  ?START: spironolactone (Aldactone) 25 mg by mouth once daily ? ?*If you need a refill on your cardiac medications before your next appointment, please call your pharmacy* ? ? ?Lab Work: ?IN 2 WEEKS in [place] office: BMP ? ?If you have labs (blood work) drawn today and your tests are completely normal, you will receive your results only by: ?MyChart Message (if you have MyChart) OR ?A paper copy in the mail ?If you have any lab test that is abnormal or we need to change your treatment, we will call you to review the results. ? ? ?Testing/Procedures: ?July-Aug 2023: Your physician has requested that you have an echocardiogram. Echocardiography is a painless test that uses sound waves to create images of your heart. It provides your doctor with information about the size and shape of your heart and how well your heart?s chambers and valves are working. This procedure takes approximately one hour. There are no restrictions for this procedure. ? ? ? ?Follow-Up: ?At [facility], you and your health needs are our priority.  As part of our continuing mission to provide you with exceptional heart care, we have created designated Provider Care Teams.  These Care Teams include your primary Cardiologist (physician) and Advanced Practice Providers (APPs -  Physician Assistants and Nurse Practitioners) who all work together to provide you with the care you need, when you need it. ? ? ?Your next appointment:   ?3 -4 month(s) same day as Echo ? ?The format for your next appointment:   ?In Person ? ?Provider:   ?Riley Lam, MD ?:1}  ? ?Important Information About Sugar ? ? ? ? ?  ?

## 2021-06-13 ENCOUNTER — Other Ambulatory Visit: Payer: Self-pay

## 2021-06-13 DIAGNOSIS — I421 Obstructive hypertrophic cardiomyopathy: Secondary | ICD-10-CM

## 2021-06-14 LAB — BASIC METABOLIC PANEL
BUN/Creatinine Ratio: 15 (ref 9–20)
BUN: 19 mg/dL (ref 6–24)
CO2: 24 mmol/L (ref 20–29)
Calcium: 10.1 mg/dL (ref 8.7–10.2)
Chloride: 97 mmol/L (ref 96–106)
Creatinine, Ser: 1.24 mg/dL (ref 0.76–1.27)
Glucose: 81 mg/dL (ref 70–99)
Potassium: 4.4 mmol/L (ref 3.5–5.2)
Sodium: 137 mmol/L (ref 134–144)
eGFR: 75 mL/min/{1.73_m2} (ref >59)

## 2021-09-09 ENCOUNTER — Ambulatory Visit (INDEPENDENT_AMBULATORY_CARE_PROVIDER_SITE_OTHER): Payer: BC Managed Care – PPO | Admitting: Internal Medicine

## 2021-09-09 ENCOUNTER — Ambulatory Visit (HOSPITAL_COMMUNITY): Payer: BC Managed Care – PPO | Attending: Cardiology

## 2021-09-09 ENCOUNTER — Encounter: Payer: Self-pay | Admitting: Internal Medicine

## 2021-09-09 VITALS — BP 140/80 | HR 70 | Ht 64.0 in | Wt 226.0 lb

## 2021-09-09 DIAGNOSIS — I421 Obstructive hypertrophic cardiomyopathy: Secondary | ICD-10-CM

## 2021-09-09 DIAGNOSIS — I10 Essential (primary) hypertension: Secondary | ICD-10-CM | POA: Diagnosis not present

## 2021-09-09 DIAGNOSIS — I422 Other hypertrophic cardiomyopathy: Secondary | ICD-10-CM

## 2021-09-09 DIAGNOSIS — G4733 Obstructive sleep apnea (adult) (pediatric): Secondary | ICD-10-CM | POA: Diagnosis not present

## 2021-09-09 LAB — ECHOCARDIOGRAM LIMITED
Area-P 1/2: 2.36 cm2
S' Lateral: 2.05 cm

## 2021-09-09 MED ORDER — SPIRONOLACTONE 50 MG PO TABS: 50.0000 mg | ORAL_TABLET | Freq: Every day | ORAL | 3 refills | Status: DC

## 2021-09-09 NOTE — Progress Notes (Signed)
Cardiology Office Note:    Date:  09/09/2021   ID:  Luke Wade, DOB 1980/04/25, MRN [id number]  PCP:  Soundra Pilon, FNP   [facility] Providers Cardiologist:  Norman Herrlich, MD     Referring MD: Soundra Pilon, FNP   CC: Follow up HCM  History of Present Illness:    Luke Wade is a 41 y.o. male with a hx of Morbid obesity HTN, OSA not on CPAP, Prior syncope (thought to be in the setting of alcohol use) with evidence of NSVT on heart monitor and hypertrophic cardiomyopathy. 2023: new NSVT and no gradient, metoprolol increased.  Patient notes SOB this morning with mild activity. Notes that he is always having fatigue  Notes no palpitations Notes persistent CP. Notes no Dizziness. Notes no syncope. Notable family events include no history of SCD; has two biological daughters and a sister.   Past Medical History:  Diagnosis Date   Abnormal myocardial perfusion study 05/02/2017   Chest pain, rule out acute myocardial infarction 04/16/2017   Displacement of lumbar intervertebral disc without myelopathy 03/24/2021   Essential hypertension 04/16/2017   Gastroesophageal reflux disease without esophagitis 08/05/2018   Generalized anxiety disorder 03/24/2021   Mild recurrent major depression (HCC) 03/24/2021   Morbid obesity (HCC) 03/24/2021   Overweight 04/16/2017   Recurrent major depression in remission (HCC) 03/24/2021   Renal artery stenosis (HCC) 03/24/2021   Vasculitis (HCC) 03/24/2021    Past Surgical History:  Procedure Laterality Date   LEFT HEART CATH AND CORONARY ANGIOGRAPHY N/A 05/03/2017   Procedure: LEFT HEART CATH AND CORONARY ANGIOGRAPHY;  Surgeon: Marykay Lex, MD;  Location: [facility] INVASIVE CV LAB;  Service: Cardiovascular;  Laterality: N/A;   NO PAST SURGERIES      Current Medications: Current Meds  Medication Sig   amLODipine (NORVASC) 10 MG tablet Take 10 mg by mouth daily.   aspirin EC 81 MG tablet Take 81 mg by mouth daily.   gabapentin  (NEURONTIN) 300 MG capsule Take 300 mg by mouth daily as needed (pain).   indapamide (LOZOL) 1.25 MG tablet Take 1.25 mg by mouth every morning.   metoprolol succinate (TOPROL XL) 50 MG 24 hr tablet Take 1 tablet (50 mg total) by mouth daily.   nitroGLYCERIN (NITROSTAT) 0.4 MG SL tablet Place 0.4 mg under the tongue every 5 (five) minutes as needed for chest pain.   spironolactone (ALDACTONE) 50 MG tablet Take 1 tablet (50 mg total) by mouth daily.   [DISCONTINUED] spironolactone (ALDACTONE) 25 MG tablet Take 1 tablet (25 mg total) by mouth daily.     Allergies:   Contrast media [iodinated contrast media]   Social History   Socioeconomic History   Marital status: Married    Spouse name: Not on file   Number of children: Not on file   Years of education: Not on file   Highest education level: Not on file  Occupational History   Not on file  Tobacco Use   Smoking status: Former    Packs/day: 0.25    Types: Cigarettes   Smokeless tobacco: Never  Vaping Use   Vaping Use: Some days  Substance and Sexual Activity   Alcohol use: Yes    Comment: occ   Drug use: Not Currently    Types: Marijuana   Sexual activity: Not on file  Other Topics Concern   Not on file  Social History Narrative   Not on file   Social Determinants of Health   Financial  Resource Strain: Not on file  Food Insecurity: Not on file  Transportation Needs: Not on file  Physical Activity: Not on file  Stress: Not on file  Social Connections: Not on file     Family History: The patient's family history includes Cancer in his father and mother. Mother died of cancer.  ROS:   Please see the history of present illness.     All other systems reviewed and are negative.  EKGs/Labs/Other Studies Reviewed:    The following studies were reviewed today:  ECHO COMPLETE WITH IMAGING ENHANCING AGENT 03/31/2021 1. Mid systolic cavty obliteration . Consider hypertrophic cardiomyopathy with mid cavity obliteration.  MRI of the heart should be consider. Left ventricular ejection fraction, by estimation, is >75%. The left ventricle has hyperdynamic function. The left ventricle has no regional wall motion abnormalities. There is moderate left ventricular hypertrophy. Left ventricular diastolic parameters are consistent with Grade I diastolic dysfunction (impaired relaxation). 2. Right ventricular systolic function is normal. The right ventricular size is normal. 3. The pericardial effusion is circumferential. There is no evidence of cardiac tamponade. 4. The mitral valve is normal in structure. No evidence of mitral valve regurgitation. No evidence of mitral stenosis. 5. The aortic valve is normal in structure. Aortic valve regurgitation is not visualized. No aortic stenosis is present. 6. The inferior vena cava is normal in size with greater than 50% respiratory variability, suggesting right atrial pressure of 3 mmHg.    Recent Labs: 04/18/2021: Hemoglobin 14.5; Platelets 292 06/13/2021: BUN 19; Creatinine, Ser 1.24; Potassium 4.4; Sodium 137  Recent Lipid Panel    Component Value Date/Time   CHOL 184 05/03/2017 0701   TRIG 267 (H) 05/03/2017 0701   HDL 47 05/03/2017 0701   CHOLHDL 3.9 05/03/2017 0701   VLDL 53 (H) 05/03/2017 0701   LDLCALC 84 05/03/2017 0701   2019: Abnormal Stress test with normal cath, hyperdynamic LV on LV-gram. EKG is LVH With secondary repolarization. Echo- Mid Cavitary Gradient no aneurysm CMR- Sparse LGE thickness 22 mm (anteroseptum)      Physical Exam:    VS:  BP (!) 140/80   Pulse 70   Ht 5\' 4"  (1.626 m)   Wt 226 lb (102.5 kg)   SpO2 98%   BMI 38.79 kg/m     Wt Readings from Last 3 Encounters:  09/09/21 226 lb (102.5 kg)  05/24/21 233 lb (105.7 kg)  05/18/21 234 lb (106.1 kg)    Gen: no distress, morbid Obesity   Neck: No JVD Cardiac: No Rubs or Gallops, systolic murmur, RRR + 2 radial pulses Respiratory: Clear to auscultation bilaterally, normal effort,  normal  respiratory rate GI: Soft, nontender, non-distended  MS: No  edema;  moves all extremities Integument: Skin feels well Neuro:  At time of evaluation, alert and oriented to person/place/time/situation  Psych: Normal affect, patient feels OK  ASSESSMENT:    1. OSA (obstructive sleep apnea)   2. Hypertrophic cardiomyopathy (HCC)   3. Essential hypertension     PLAN:    Hypertrophic Cardiomyopathy With HTN, Morbid Obesity, widely patent CORS OSA not on CPAP NSVT - This is likely mid cavitary gradient HCM without out aneurysm, mild gradient at the mid cavitary (under 30 mm Hg) - continue metoprolol; will increase his aldactone to 50 mg PO daily for BP; will check BMP in 1-2 weeks in [place]  - No Fhx; his daughter's are 7-8, after discussion with family he will pursue genetic testing  - CMR from 2023 notable for septal  thickness of 22 mm, LGE < 15% - SCD risk is ~ 5.5% (rare NSVT on monitor) - Based on the SCD risk factors present, this patient has a Class 2B indication for an ICD (may be considered) - SDM: we will defer ICD/EP eval at this time; at next visit we will get repeat heart monitor and if worsening NSVT despite BB will send to EP  His fatigue is contributed by his not treated OSA; he would like to change sleep providers to our group, will change  April f/u in [place]  Time Spent Directly with Patient:   I have spent a total of 40 minutes with the patient reviewing notes, imaging, EKGs, labs and examining the patient as well as establishing an assessment and plan that was discussed personally with the patient.  > 50% of time was spent in direct patient care and wife and reviewing imaging with patient.      Medication Adjustments/Labs and Tests Ordered: Current medicines are reviewed at length with the patient today.  Concerns regarding medicines are outlined above.  Orders Placed This Encounter  Procedures   Basic metabolic panel   Ambulatory referral to  Sleep Studies   Ambulatory referral to Genetics   Meds ordered this encounter  Medications   spironolactone (ALDACTONE) 50 MG tablet    Sig: Take 1 tablet (50 mg total) by mouth daily.    Dispense:  90 tablet    Refill:  3    Patient Instructions  Medication Instructions:  Your physician has recommended you make the following change in your medication:  INCREASE: spironolactone (Aldactone) to 50 mg by mouth once daily  *If you need a refill on your cardiac medications before your next appointment, please call your pharmacy*   Lab Work: IN 1-2 WEEKS: BMP  If you have labs (blood work) drawn today and your tests are completely normal, you will receive your results only by: MyChart Message (if you have MyChart) OR A paper copy in the mail If you have any lab test that is abnormal or we need to change your treatment, we will call you to review the results.   Testing/Procedures: Your physician has referred you to a Runner, broadcasting/film/video.  Your physician has referred you to see a Sleep Specialist.    Follow-Up: At [facility], you and your health needs are our priority.  As part of our continuing mission to provide you with exceptional heart care, we have created designated Provider Care Teams.  These Care Teams include your primary Cardiologist (physician) and Advanced Practice Providers (APPs -  Physician Assistants and Nurse Practitioners) who all work together to provide you with the care you need, when you need it.  Your next appointment:   8 month(s)  The format for your next appointment:   In Person  Provider:   Riley Lam, MD - In [facility]    Important Information About Sugar         Signed, Christell Constant, MD  09/09/2021 4:11 PM    [facility]

## 2021-09-09 NOTE — Patient Instructions (Signed)
Medication Instructions:  Your physician has recommended you make the following change in your medication:  INCREASE: spironolactone (Aldactone) to 50 mg by mouth once daily  *If you need a refill on your cardiac medications before your next appointment, please call your pharmacy*   Lab Work: IN 1-2 WEEKS: BMP  If you have labs (blood work) drawn today and your tests are completely normal, you will receive your results only by: MyChart Message (if you have MyChart) OR A paper copy in the mail If you have any lab test that is abnormal or we need to change your treatment, we will call you to review the results.   Testing/Procedures: Your physician has referred you to a Runner, broadcasting/film/video.  Your physician has referred you to see a Sleep Specialist.    Follow-Up: At [facility], you and your health needs are our priority.  As part of our continuing mission to provide you with exceptional heart care, we have created designated Provider Care Teams.  These Care Teams include your primary Cardiologist (physician) and Advanced Practice Providers (APPs -  Physician Assistants and Nurse Practitioners) who all work together to provide you with the care you need, when you need it.  Your next appointment:   8 month(s)  The format for your next appointment:   In Person  Provider:   Riley Lam, MD - In [place]    Important Information About Sugar

## 2021-09-13 ENCOUNTER — Other Ambulatory Visit: Payer: Self-pay

## 2021-09-13 DIAGNOSIS — I422 Other hypertrophic cardiomyopathy: Secondary | ICD-10-CM

## 2021-09-13 DIAGNOSIS — I10 Essential (primary) hypertension: Secondary | ICD-10-CM

## 2021-09-21 ENCOUNTER — Other Ambulatory Visit: Payer: BC Managed Care – PPO

## 2021-09-21 ENCOUNTER — Other Ambulatory Visit: Payer: Self-pay

## 2021-09-21 DIAGNOSIS — I422 Other hypertrophic cardiomyopathy: Secondary | ICD-10-CM

## 2021-09-21 DIAGNOSIS — I10 Essential (primary) hypertension: Secondary | ICD-10-CM

## 2021-09-22 LAB — BASIC METABOLIC PANEL
BUN/Creatinine Ratio: 17 (ref 9–20)
BUN: 19 mg/dL (ref 6–24)
CO2: 25 mmol/L (ref 20–29)
Calcium: 10 mg/dL (ref 8.7–10.2)
Chloride: 98 mmol/L (ref 96–106)
Creatinine, Ser: 1.09 mg/dL (ref 0.76–1.27)
Glucose: 80 mg/dL (ref 70–99)
Potassium: 3.8 mmol/L (ref 3.5–5.2)
Sodium: 137 mmol/L (ref 134–144)
eGFR: 87 mL/min/{1.73_m2} (ref >59)

## 2021-09-22 LAB — SPECIMEN STATUS REPORT

## 2021-10-04 ENCOUNTER — Ambulatory Visit: Payer: BC Managed Care – PPO | Admitting: Cardiology

## 2021-10-04 ENCOUNTER — Other Ambulatory Visit: Payer: Self-pay

## 2021-10-05 ENCOUNTER — Encounter: Payer: Self-pay | Admitting: Cardiology

## 2021-10-05 ENCOUNTER — Ambulatory Visit: Payer: BC Managed Care – PPO | Attending: Cardiology | Admitting: Cardiology

## 2021-10-05 VITALS — BP 134/74 | HR 74 | Ht 65.0 in | Wt 222.0 lb

## 2021-10-05 DIAGNOSIS — E669 Obesity, unspecified: Secondary | ICD-10-CM | POA: Insufficient documentation

## 2021-10-05 DIAGNOSIS — I10 Essential (primary) hypertension: Secondary | ICD-10-CM | POA: Diagnosis not present

## 2021-10-05 DIAGNOSIS — I4729 Other ventricular tachycardia: Secondary | ICD-10-CM

## 2021-10-05 DIAGNOSIS — I421 Obstructive hypertrophic cardiomyopathy: Secondary | ICD-10-CM | POA: Diagnosis not present

## 2021-10-05 HISTORY — DX: Obesity, unspecified: E66.9

## 2021-10-05 NOTE — Progress Notes (Signed)
Cardiology Office Note:    Date:  10/05/2021   ID:  Luke Wade, DOB 1980-09-01, MRN [id number]  PCP:  Soundra Pilon, FNP  Cardiologist:  Garwin Brothers, MD   Referring MD: Soundra Pilon, FNP    ASSESSMENT:    1. Essential hypertension   2. NSVT (nonsustained ventricular tachycardia) (HCC)   3. Obstructive hypertrophic cardiomyopathy (HCC)   4. Obesity (BMI 35.0-39.9 without comorbidity)    PLAN:    In order of problems listed above:  Primary prevention stressed to the patient.  Importance of compliance with diet and medication stressed and she vocalized understanding.  He was advised to continue his excellent exercise protocol Hypertrophic cardiomyopathy: I reviewed notes from our colleague who specializes in this aspect of his cardiac care.  I discussed this with the patient at length and questions were answered to his satisfaction. Essential hypertension: Blood pressure stable and diet was emphasized.  Lifestyle modification urged. Obesity: Risks of obesity explained he is doing very well.  I emphasized diet again at extensive length. I reviewed blood work done recently and discussed with him.Patient will be seen in follow-up appointment in 12 months or earlier if the patient has any concerns     Medication Adjustments/Labs and Tests Ordered: Current medicines are reviewed at length with the patient today.  Concerns regarding medicines are outlined above.  No orders of the defined types were placed in this encounter.  No orders of the defined types were placed in this encounter.    No chief complaint on file.    History of Present Illness:    Luke Wade is a 41 y.o. male.  Patient has past medical history of essential hypertension, nonsustained ventricular tachycardia, obstructive hypertrophic cardiomyopathy.  He is overweight.  He denies any chest pain orthopnea or PND.  He mentions to me that he is not exercising and walking on a regular basis.  He is  lost significant amount of weight.  At the time of my evaluation, the patient is alert awake oriented and in no distress.  Past Medical History:  Diagnosis Date   Abnormal myocardial perfusion study 05/02/2017   Chest pain, rule out acute myocardial infarction 04/16/2017   Displacement of lumbar intervertebral disc without myelopathy 03/24/2021   Essential hypertension 04/16/2017   Gastroesophageal reflux disease without esophagitis 08/05/2018   Generalized anxiety disorder 03/24/2021   Mild recurrent major depression (HCC) 03/24/2021   Morbid obesity (HCC) 03/24/2021   NSVT (nonsustained ventricular tachycardia) (HCC) 05/18/2021   Obstructive hypertrophic cardiomyopathy (HCC) 05/24/2021   Overweight 04/16/2017   Recurrent major depression in remission (HCC) 03/24/2021   Renal artery stenosis (HCC) 03/24/2021   Vasculitis (HCC) 03/24/2021    Past Surgical History:  Procedure Laterality Date   LEFT HEART CATH AND CORONARY ANGIOGRAPHY N/A 05/03/2017   Procedure: LEFT HEART CATH AND CORONARY ANGIOGRAPHY;  Surgeon: Marykay Lex, MD;  Location: [facility] INVASIVE CV LAB;  Service: Cardiovascular;  Laterality: N/A;   NO PAST SURGERIES      Current Medications: Current Meds  Medication Sig   amLODipine (NORVASC) 10 MG tablet Take 10 mg by mouth daily.   aspirin EC 81 MG tablet Take 81 mg by mouth daily.   gabapentin (NEURONTIN) 300 MG capsule Take 300 mg by mouth daily as needed (pain).   indapamide (LOZOL) 1.25 MG tablet Take 1.25 mg by mouth every morning.   metoprolol succinate (TOPROL-XL) 25 MG 24 hr tablet Take 25 mg by mouth daily.  nitroGLYCERIN (NITROSTAT) 0.4 MG SL tablet Place 0.4 mg under the tongue every 5 (five) minutes as needed for chest pain.   spironolactone (ALDACTONE) 50 MG tablet Take 1 tablet (50 mg total) by mouth daily.     Allergies:   Contrast media [iodinated contrast media]   Social History   Socioeconomic History   Marital status: Married    Spouse name: Not on  file   Number of children: Not on file   Years of education: Not on file   Highest education level: Not on file  Occupational History   Not on file  Tobacco Use   Smoking status: Former    Packs/day: 0.25    Types: Cigarettes   Smokeless tobacco: Never  Vaping Use   Vaping Use: Some days  Substance and Sexual Activity   Alcohol use: Yes    Comment: occ   Drug use: Not Currently    Types: Marijuana   Sexual activity: Not on file  Other Topics Concern   Not on file  Social History Narrative   Not on file   Social Determinants of Health   Financial Resource Strain: Not on file  Food Insecurity: Not on file  Transportation Needs: Not on file  Physical Activity: Not on file  Stress: Not on file  Social Connections: Not on file     Family History: The patient's family history includes Cancer in his father and mother.  ROS:   Please see the history of present illness.    All other systems reviewed and are negative.  EKGs/Labs/Other Studies Reviewed:    The following studies were reviewed today: I discussed my findings with the patient.   Recent Labs: 04/18/2021: Hemoglobin 14.5; Platelets 292 09/21/2021: BUN 19; Creatinine, Ser 1.09; Potassium 3.8; Sodium 137  Recent Lipid Panel    Component Value Date/Time   CHOL 184 05/03/2017 0701   TRIG 267 (H) 05/03/2017 0701   HDL 47 05/03/2017 0701   CHOLHDL 3.9 05/03/2017 0701   VLDL 53 (H) 05/03/2017 0701   LDLCALC 84 05/03/2017 0701    Physical Exam:    VS:  BP 134/74   Pulse 74   Ht 5\' 5"  (1.651 m)   Wt 222 lb 0.6 oz (100.7 kg)   SpO2 98%   BMI 36.95 kg/m     Wt Readings from Last 3 Encounters:  10/05/21 222 lb 0.6 oz (100.7 kg)  09/09/21 226 lb (102.5 kg)  05/24/21 233 lb (105.7 kg)     GEN: Patient is in no acute distress HEENT: Normal NECK: No JVD; No carotid bruits LYMPHATICS: No lymphadenopathy CARDIAC: Hear sounds regular, 2/6 systolic murmur at the apex. RESPIRATORY:  Clear to auscultation  without rales, wheezing or rhonchi  ABDOMEN: Soft, non-tender, non-distended MUSCULOSKELETAL:  No edema; No deformity  SKIN: Warm and dry NEUROLOGIC:  Alert and oriented x 3 PSYCHIATRIC:  Normal affect   Signed, 05/26/21, MD  10/05/2021 3:35 PM    [facility]

## 2021-10-05 NOTE — Patient Instructions (Signed)

## 2021-10-18 ENCOUNTER — Telehealth: Payer: Self-pay

## 2021-10-18 NOTE — Telephone Encounter (Signed)
Called pt to follow up on sleep referral placed by Dr. Izora Ribas.  According to referral note pt had a sleep study with Dr. Parke Simmers in the last year.  Need to know if Dr. Parke Simmers is following pt for sleep.

## 2021-11-02 NOTE — Telephone Encounter (Signed)
Called pt expressed Dr. Criss Rosales has set up with CPAP machine.  Referral not needed at this time.

## 2021-12-06 ENCOUNTER — Telehealth: Payer: Self-pay | Admitting: Cardiology

## 2021-12-06 NOTE — Telephone Encounter (Signed)
Pt has been having chest pain off and on. Request an appointment. Appointment was offered for 11/2 but requested next week 12/15/21.

## 2021-12-06 NOTE — Telephone Encounter (Signed)
Pt c/o of Chest Pain: STAT if CP now or developed within 24 hours  1. Are you having CP right now? no  2. Are you experiencing any other symptoms (ex. SOB, nausea, vomiting, sweating)? Sob (on/off)  3. How long have you been experiencing CP? For bout month, last couple of days it gotten worse   4. Is your CP continuous or coming and going? States that he can be both but mostly coming and going   5. Have you taken Nitroglycerin? yes ?

## 2021-12-07 ENCOUNTER — Other Ambulatory Visit: Payer: Self-pay

## 2021-12-15 ENCOUNTER — Encounter: Payer: Self-pay | Admitting: Cardiology

## 2021-12-15 ENCOUNTER — Ambulatory Visit: Payer: BC Managed Care – PPO | Attending: Cardiology | Admitting: Cardiology

## 2021-12-15 VITALS — BP 130/72 | HR 87 | Ht 65.0 in | Wt 223.0 lb

## 2021-12-15 DIAGNOSIS — I421 Obstructive hypertrophic cardiomyopathy: Secondary | ICD-10-CM | POA: Diagnosis not present

## 2021-12-15 DIAGNOSIS — I209 Angina pectoris, unspecified: Secondary | ICD-10-CM | POA: Insufficient documentation

## 2021-12-15 DIAGNOSIS — E669 Obesity, unspecified: Secondary | ICD-10-CM

## 2021-12-15 DIAGNOSIS — I10 Essential (primary) hypertension: Secondary | ICD-10-CM

## 2021-12-15 DIAGNOSIS — R072 Precordial pain: Secondary | ICD-10-CM | POA: Diagnosis not present

## 2021-12-15 DIAGNOSIS — I4729 Other ventricular tachycardia: Secondary | ICD-10-CM | POA: Diagnosis not present

## 2021-12-15 MED ORDER — PREDNISONE 50 MG PO TABS: ORAL_TABLET | ORAL | 0 refills | Status: DC

## 2021-12-15 NOTE — Patient Instructions (Signed)
Medication Instructions:   TAKE: Metoprolol 50mg  2 tablets 2 hours prior to CT scan  Take Nitroglycerin if needed as prescribed  *If you need a refill on your cardiac medications before your next appointment, please call your pharmacy*   Lab Work: None   If you have labs (blood work) drawn today and your tests are completely normal, you will receive your results only by: MyChart Message (if you have MyChart) OR A paper copy in the mail If you have any lab test that is abnormal or we need to change your treatment, we will call you to review the results.   Testing/Procedures:   Your cardiac CT will be scheduled at one of the below locations:   [facility] [address] [place], [place] [phone]   At [facility], please arrive at the [facility]'s Entrance (Entrance C2) of [facility] 30 minutes prior to test start time. You can use the FREE valet parking offered at entrance C (encouraged to control the heart rate for the test)  Proceed to the [facility] Radiology Department (first floor) to check-in and test prep.  All radiology patients and guests should use entrance C2 at [facility], accessed from [facility], even though the hospital's physical address listed is [address].      Please follow these instructions carefully (unless otherwise directed):  Hold all erectile dysfunction medications at least 3 days (72 hrs) prior to test.  On the Night Before the Test: Be sure to Drink plenty of water. Do not consume any caffeinated/decaffeinated beverages or chocolate 12 hours prior to your test. Do not take any antihistamines 12 hours prior to your test. If the patient has contrast allergy: Patient will need a prescription for Prednisone and very clear instructions (as follows): Prednisone 50 mg - take 13 hours prior to test Take another Prednisone 50 mg 7 hours prior to test Take  another Prednisone 50 mg 1 hour prior to test Take Benadryl 50 mg 1 hour prior to test Patient must complete all four doses of above prophylactic medications. Patient will need a ride after test due to Benadryl.  On the Day of the Test: Drink plenty of water until 1 hour prior to the test. Do not eat any food 4 hours prior to the test. You may take your regular medications prior to the test.  Take metoprolol (Lopressor) two hours prior to test.       After the Test: Drink plenty of water. After receiving IV contrast, you may experience a mild flushed feeling. This is normal. On occasion, you may experience a mild rash up to 24 hours after the test. This is not dangerous. If this occurs, you can take Benadryl 25 mg and increase your fluid intake. If you experience trouble breathing, this can be serious. If it is severe call 911 IMMEDIATELY. If it is mild, please call our office. If you take any of these medications: Glipizide/Metformin, Avandament, Glucavance, please do not take 48 hours after completing test unless otherwise instructed.  We will call to schedule your test 2-4 weeks out understanding that some insurance companies will need an authorization prior to the service being performed.   For non-scheduling related questions, please contact the cardiac imaging nurse navigator should you have any questions/concerns: [address], Cardiac Imaging Nurse Navigator [place], Cardiac Imaging Nurse Navigator [facility] Direct Office Dial: [phone]   For scheduling needs,  including cancellations and rescheduling, please call [place], [phone].    Follow-Up: At [facility], you and your health needs are our priority.  As part of our continuing mission to provide you with exceptional heart care, we have created designated Provider Care Teams.  These Care Teams include your primary Cardiologist (physician) and Advanced Practice Providers (APPs -   Physician Assistants and Nurse Practitioners) who all work together to provide you with the care you need, when you need it.  We recommend signing up for the patient portal called "MyChart".  Sign up information is provided on this After Visit Summary.  MyChart is used to connect with patients for Virtual Visits (Telemedicine).  Patients are able to view lab/test results, encounter notes, upcoming appointments, etc.  Non-urgent messages can be sent to your provider as well.   To learn more about what you can do with MyChart, go to [email or web address].    Your next appointment:   9 month(s)  The format for your next appointment:   In Person  Provider:   Belva Crome, MD   Other Instructions Cardiac CT Angiogram A cardiac CT angiogram is a procedure to look at the heart and the area around the heart. It may be done to help find the cause of chest pains or other symptoms of heart disease. During this procedure, a substance called contrast dye is injected into the blood vessels in the area to be checked. A large X-ray machine, called a CT scanner, then takes detailed pictures of the heart and the surrounding area. The procedure is also sometimes called a coronary CT angiogram, coronary artery scanning, or CTA. A cardiac CT angiogram allows the health care provider to see how well blood is flowing to and from the heart. The health care provider will be able to see if there are any problems, such as: Blockage or narrowing of the coronary arteries in the heart. Fluid around the heart. Signs of weakness or disease in the muscles, valves, and tissues of the heart. Tell a health care provider about: Any allergies you have. This is especially important if you have had a previous allergic reaction to contrast dye. All medicines you are taking, including vitamins, herbs, eye drops, creams, and over-the-counter medicines. Any blood disorders you have. Any surgeries you have had. Any medical  conditions you have. Whether you are pregnant or may be pregnant. Any anxiety disorders, chronic pain, or other conditions you have that may increase your stress or prevent you from lying still. What are the risks? Generally, this is a safe procedure. However, problems may occur, including: Bleeding. Infection. Allergic reactions to medicines or dyes. Damage to other structures or organs. Kidney damage from the contrast dye that is used. Increased risk of cancer from radiation exposure. This risk is low. Talk with your health care provider about: The risks and benefits of testing. How you can receive the lowest dose of radiation. What happens before the procedure? Wear comfortable clothing and remove any jewelry, glasses, dentures, and hearing aids. Follow instructions from your health care provider about eating and drinking. This may include: For 12 hours before the procedure -- avoid caffeine. This includes tea, coffee, soda, energy drinks, and diet pills. Drink plenty of water or other fluids that do not have caffeine in them. Being well hydrated can prevent complications. For 4-6 hours before the procedure -- stop eating and drinking. The contrast dye can cause nausea, but this is less likely if your stomach is empty. Ask your health  care provider about changing or stopping your regular medicines. This is especially important if you are taking diabetes medicines, blood thinners, or medicines to treat problems with erections (erectile dysfunction). What happens during the procedure?  Hair on your chest may need to be removed so that small sticky patches called electrodes can be placed on your chest. These will transmit information that helps to monitor your heart during the procedure. An IV will be inserted into one of your veins. You might be given a medicine to control your heart rate during the procedure. This will help to ensure that good images are obtained. You will be asked to lie  on an exam table. This table will slide in and out of the CT machine during the procedure. Contrast dye will be injected into the IV. You might feel warm, or you may get a metallic taste in your mouth. You will be given a medicine called nitroglycerin. This will relax or dilate the arteries in your heart. The table that you are lying on will move into the CT machine tunnel for the scan. The person running the machine will give you instructions while the scans are being done. You may be asked to: Keep your arms above your head. Hold your breath. Stay very still, even if the table is moving. When the scanning is complete, you will be moved out of the machine. The IV will be removed. The procedure may vary among health care providers and hospitals. What can I expect after the procedure? After your procedure, it is common to have: A metallic taste in your mouth from the contrast dye. A feeling of warmth. A headache from the nitroglycerin. Follow these instructions at home: Take over-the-counter and prescription medicines only as told by your health care provider. If you are told, drink enough fluid to keep your urine pale yellow. This will help to flush the contrast dye out of your body. Most people can return to their normal activities right after the procedure. Ask your health care provider what activities are safe for you. It is up to you to get the results of your procedure. Ask your health care provider, or the department that is doing the procedure, when your results will be ready. Keep all follow-up visits as told by your health care provider. This is important. Contact a health care provider if: You have any symptoms of allergy to the contrast dye. These include: Shortness of breath. Rash or hives. A racing heartbeat. Summary A cardiac CT angiogram is a procedure to look at the heart and the area around the heart. It may be done to help find the cause of chest pains or other symptoms  of heart disease. During this procedure, a large X-ray machine, called a CT scanner, takes detailed pictures of the heart and the surrounding area after a contrast dye has been injected into blood vessels in the area. Ask your health care provider about changing or stopping your regular medicines before the procedure. This is especially important if you are taking diabetes medicines, blood thinners, or medicines to treat erectile dysfunction. If you are told, drink enough fluid to keep your urine pale yellow. This will help to flush the contrast dye out of your body. This information is not intended to replace advice given to you by your health care provider. Make sure you discuss any questions you have with your health care provider. Document Revised: 09/18/2018 Document Reviewed: 09/18/2018 Elsevier Patient Education  [place].

## 2021-12-15 NOTE — Progress Notes (Signed)
Cardiology Office Note:    Date:  12/15/2021   ID:  Luke Wade, DOB 1980-11-13, MRN [id number]  PCP:  Luke Pilon, FNP  Cardiologist:  Luke Brothers, MD   Referring MD: Luke Pilon, FNP    ASSESSMENT:    1. Angina pectoris (HCC)   2. NSVT (nonsustained ventricular tachycardia) (HCC)   3. Obstructive hypertrophic cardiomyopathy (HCC)   4. Essential hypertension   5. Obesity (BMI 35.0-39.9 without comorbidity)    PLAN:    In order of problems listed above:  Primary prevention stressed with the patient.  Importance of compliance with diet medication stressed any vocalized understanding. Angina pectoris: Patient's symptoms are concerning.  He has multiple risk factors for coronary artery disease and I discussed this with him at extensive length.  I gave him multiple modalities of evaluation and he prefers CT coronary angiography.  We will set this up for him.  Sublingual nitroglycerin prescription was sent, its protocol and 911 protocol explained and the patient vocalized understanding questions were answered to the patient's satisfaction.  He knows to go to the nearest emergency room for any concerning symptoms. Essential hypertension: Blood pressure stable and diet was emphasized. Hypertrophic cardiomyopathy: Stable at this time and we will continue to monitor. Nonsustained ventricular tachycardia: I reviewed my colleagues notes and discussed with the patient at length. Patient will be seen in follow-up appointment in 6 months or earlier if the patient has any concerns    Medication Adjustments/Labs and Tests Ordered: Current medicines are reviewed at length with the patient today.  Concerns regarding medicines are outlined above.  No orders of the defined types were placed in this encounter.  No orders of the defined types were placed in this encounter.    No chief complaint on file.    History of Present Illness:    Luke Wade is a 41 y.o. male.   Patient has past medical history of hypertrophic cardiomyopathy, essential hypertension and obesity.  He has had history of nonsustained ventricular tachycardia and sleep apnea.  He saw our hypertrophic cardiomyopathy specialist and I reviewed those notes.  He denies any chest pain orthopnea or PND.  However he gives history of chest tightness on exertion and also on sexual activity and this is concerning.  No radiation to the neck or to the arms.  He wants to be evaluated for this.  At the time of my evaluation, the patient is alert awake oriented and in no distress.   Past Medical History:  Diagnosis Date   Abnormal myocardial perfusion study 05/02/2017   Chest pain, rule out acute myocardial infarction 04/16/2017   Displacement of lumbar intervertebral disc without myelopathy 03/24/2021   Essential hypertension 04/16/2017   Gastroesophageal reflux disease without esophagitis 08/05/2018   Generalized anxiety disorder 03/24/2021   Mild recurrent major depression (HCC) 03/24/2021   Morbid obesity (HCC) 03/24/2021   NSVT (nonsustained ventricular tachycardia) (HCC) 05/18/2021   Obesity (BMI 35.0-39.9 without comorbidity) 10/05/2021   Obstructive hypertrophic cardiomyopathy (HCC) 05/24/2021   Overweight 04/16/2017   Recurrent major depression in remission (HCC) 03/24/2021   Renal artery stenosis (HCC) 03/24/2021   Vasculitis (HCC) 03/24/2021    Past Surgical History:  Procedure Laterality Date   LEFT HEART CATH AND CORONARY ANGIOGRAPHY N/A 05/03/2017   Procedure: LEFT HEART CATH AND CORONARY ANGIOGRAPHY;  Surgeon: Marykay Lex, MD;  Location: [facility] INVASIVE CV LAB;  Service: Cardiovascular;  Laterality: N/A;   NO PAST SURGERIES  Current Medications: Current Meds  Medication Sig   amLODipine (NORVASC) 10 MG tablet Take 10 mg by mouth daily.   aspirin EC 81 MG tablet Take 81 mg by mouth daily.   gabapentin (NEURONTIN) 300 MG capsule Take 300 mg by mouth daily as needed (pain).   indapamide  (LOZOL) 1.25 MG tablet Take 1.25 mg by mouth every morning.   metoprolol succinate (TOPROL-XL) 50 MG 24 hr tablet Take 50 mg by mouth daily.   nitroGLYCERIN (NITROSTAT) 0.4 MG SL tablet Place 0.4 mg under the tongue every 5 (five) minutes as needed for chest pain.   spironolactone (ALDACTONE) 50 MG tablet Take 50 mg by mouth daily.   [DISCONTINUED] spironolactone (ALDACTONE) 25 MG tablet Take 25 mg by mouth daily.     Allergies:   Contrast media [iodinated contrast media]   Social History   Socioeconomic History   Marital status: Married    Spouse name: Not on file   Number of children: Not on file   Years of education: Not on file   Highest education level: Not on file  Occupational History   Not on file  Tobacco Use   Smoking status: Former    Packs/day: 0.25    Types: Cigarettes   Smokeless tobacco: Never  Vaping Use   Vaping Use: Some days  Substance and Sexual Activity   Alcohol use: Yes    Comment: occ   Drug use: Not Currently    Types: Marijuana   Sexual activity: Not on file  Other Topics Concern   Not on file  Social History Narrative   Not on file   Social Determinants of Health   Financial Resource Strain: Not on file  Food Insecurity: Not on file  Transportation Needs: Not on file  Physical Activity: Not on file  Stress: Not on file  Social Connections: Not on file     Family History: The patient's family history includes Cancer in his father and mother.  ROS:   Please see the history of present illness.    All other systems reviewed and are negative.  EKGs/Labs/Other Studies Reviewed:    The following studies were reviewed today: EKG reveals sinus rhythm with left ventricular hypertrophy and nonspecific ST changes   Recent Labs: 04/18/2021: Hemoglobin 14.5; Platelets 292 09/21/2021: BUN 19; Creatinine, Ser 1.09; Potassium 3.8; Sodium 137  Recent Lipid Panel    Component Value Date/Time   CHOL 184 05/03/2017 0701   TRIG 267 (H) 05/03/2017  0701   HDL 47 05/03/2017 0701   CHOLHDL 3.9 05/03/2017 0701   VLDL 53 (H) 05/03/2017 0701   LDLCALC 84 05/03/2017 0701    Physical Exam:    VS:  BP 130/72   Pulse 87   Ht 5\' 5"  (1.651 m)   Wt 223 lb (101.2 kg)   SpO2 95%   BMI 37.11 kg/m     Wt Readings from Last 3 Encounters:  12/15/21 223 lb (101.2 kg)  10/05/21 222 lb 0.6 oz (100.7 kg)  09/09/21 226 lb (102.5 kg)     GEN: Patient is in no acute distress HEENT: Normal NECK: No JVD; No carotid bruits LYMPHATICS: No lymphadenopathy CARDIAC: Hear sounds regular, 2/6 systolic murmur at the apex. RESPIRATORY:  Clear to auscultation without rales, wheezing or rhonchi  ABDOMEN: Soft, non-tender, non-distended MUSCULOSKELETAL:  No edema; No deformity  SKIN: Warm and dry NEUROLOGIC:  Alert and oriented x 3 PSYCHIATRIC:  Normal affect   Signed, 11/09/21, MD  12/15/2021 3:31  PM    [facility]

## 2021-12-27 ENCOUNTER — Telehealth (HOSPITAL_COMMUNITY): Payer: Self-pay | Admitting: Emergency Medicine

## 2021-12-27 NOTE — Telephone Encounter (Signed)
Reaching out to patient to offer assistance regarding upcoming cardiac imaging study; pt verbalizes understanding of appt date/time, parking situation and where to check in, pre-test NPO status and medications ordered, and verified current allergies; name and call back number provided for further questions should they arise Rockwell Alexandria RN Navigator Cardiac Imaging [facility] [phone] office [phone] cell  Reviewed times to take 13 hr prep and metoprolol Arrival 230

## 2021-12-28 ENCOUNTER — Ambulatory Visit (HOSPITAL_COMMUNITY)
Admission: RE | Admit: 2021-12-28 | Discharge: 2021-12-28 | Disposition: A | Discharge status: Home / Self Care | Payer: BC Managed Care – PPO | Source: Ambulatory Visit | Attending: Cardiology | Admitting: Cardiology

## 2021-12-28 DIAGNOSIS — R072 Precordial pain: Secondary | ICD-10-CM | POA: Insufficient documentation

## 2021-12-28 MED ORDER — NITROGLYCERIN 0.4 MG SL SUBL
SUBLINGUAL_TABLET | SUBLINGUAL | Status: AC
  Filled 2021-12-28: qty 2

## 2021-12-28 MED ORDER — DILTIAZEM HCL 25 MG/5ML IV SOLN
INTRAVENOUS | Status: AC
  Administered 2021-12-28: 10 mg via INTRAVENOUS
  Filled 2021-12-28: qty 5

## 2021-12-28 MED ORDER — DILTIAZEM HCL 25 MG/5ML IV SOLN: 10.0000 mg | Freq: Once | INTRAVENOUS | Status: AC

## 2021-12-28 MED ORDER — DILTIAZEM HCL 25 MG/5ML IV SOLN
10.0000 mg | Freq: Once | INTRAVENOUS | Status: AC
  Administered 2021-12-28: 10 mg via INTRAVENOUS

## 2021-12-28 MED ORDER — METOPROLOL TARTRATE 5 MG/5ML IV SOLN: 10.0000 mg | Freq: Once | INTRAVENOUS | Status: AC

## 2021-12-28 MED ORDER — METOPROLOL TARTRATE 5 MG/5ML IV SOLN
INTRAVENOUS | Status: AC
  Administered 2021-12-28: 10 mg via INTRAVENOUS
  Filled 2021-12-28: qty 10

## 2021-12-28 MED ORDER — NITROGLYCERIN 0.4 MG SL SUBL
0.8000 mg | SUBLINGUAL_TABLET | Freq: Once | SUBLINGUAL | Status: AC
  Administered 2021-12-28: 0.8 mg via SUBLINGUAL

## 2021-12-28 MED ORDER — IOHEXOL 350 MG/ML SOLN
95.0000 mL | Freq: Once | INTRAVENOUS | Status: AC | PRN
  Administered 2021-12-28: 95 mL via INTRAVENOUS

## 2022-01-11 ENCOUNTER — Ambulatory Visit
Admission: RE | Admit: 2022-01-11 | Discharge: 2022-01-11 | Disposition: A | Discharge status: Home / Self Care | Payer: BC Managed Care – PPO | Source: Ambulatory Visit | Attending: Family Medicine | Admitting: Family Medicine

## 2022-01-11 ENCOUNTER — Other Ambulatory Visit: Payer: Self-pay | Admitting: Family Medicine

## 2022-01-11 DIAGNOSIS — R079 Chest pain, unspecified: Secondary | ICD-10-CM

## 2022-01-11 DIAGNOSIS — M5410 Radiculopathy, site unspecified: Secondary | ICD-10-CM

## 2022-01-17 ENCOUNTER — Ambulatory Visit: Payer: BC Managed Care – PPO | Attending: Genetic Counselor | Admitting: Genetic Counselor

## 2022-01-17 DIAGNOSIS — I421 Obstructive hypertrophic cardiomyopathy: Secondary | ICD-10-CM

## 2022-01-18 NOTE — Progress Notes (Signed)
Pre Test Genetic Consult  Referring Provider: Riley Lam, MD   Referral Reason  Luke Wade is referred for genetic consult and testing for the hypertrophic cardiomyopathy subsequent to recent cardiac imaging studies that detected cardiac wall thickness suggestive of HCM.  Personal Medical Information Beckam (III.1 on pedigree) is a 41 year old African American gentleman who was referred to a cardiologist for treating his underlying HTN. Upon undergoing an echocardiogram that detected moderate left ventricular hypertrophy, he was referred to Dr. Izora Ribas for suspicion of HCM. His cardiac MRI detected mid to apical left ventricular hypertrophy of 2.2 cm, with LVEF of 55% and scarring (<15%) in the hypertrophied areas.  He tells me that he has been experiencing symptoms for the last 3-4 years, namely chest pressure especially while working and severe fatigue. Also reports having shortness of breath that he initially attributed to being a former smoker, but has noticed that it worsens with exertion. Has episodic heart palpitations and dizziness. He tells me of having a syncopal event early this year when he stood up to walk and passed out after having two drinks of alcohol. Since he regained consciousness right away he did not seek medical care.   Traditional Risk Factors Inocencio reports being diagnosed with HTN 5 years ago but was not controlled well with medication. A recent change in his medication has vastly improved his HTN.  Family history  Relation to Proband Pedigree # Current age Heart condition/age of onset Notes  Daughters-2 IV.1, IV.2 8, 7 None   Sister III.2 41 None Echo/EKG ND  Nephews, Nieces IV.3-IV.8 25-7 None         Father II.13 Deceased Unaware Died of leukemia @ 72  Paternal aunts, uncles II.1-II.12 Deceased-4 living-8 Unaware Not in touch  Paternal grandfather I.1 Deceased Unaware d. ?  Paternal grandmother I.2 Deceased Unaware d. ?        Mother II.14 Deceased  None Died of cancer @ 42  Maternal aunts, uncles II.15-II.20 Deceased-4 living-2 Unaware Not in touch  Maternal grandfather I.3 Deceased Unaware d. ?  Maternal grandmother I.4 Deceased Unaware d. ?   Ferdinando Lodge was counseled on the genetics of hypertrophic cardiomyopathy (HCM). I explained to the patient that this is an autosomal dominant condition with incomplete penetrance i.e. not all individuals harboring the HCM mutation will present clinically with HCM, and age-related penetrance where clinical presentation of HCM increases with advanced age.   Since HCM is an autosomal dominant condition, first degree-relatives are at a 50% risk of inheriting this condition. They should seek regular surveillance for HCM by EKG and echocardiogram.  Explained to patient that frequency of clinical screening is typically determined by age, with those in their teens undergoing screening every year and those over the age of 13 getting screened every 3-5 years. Patient verbalized understanding of this.   I informed the patient that about 8-10% of HCM patients can have compound and digenic mutations for HCM. Also briefly discussed the inheritance pattern and treatment /management plans for the infiltrative cardiomyopathies that present as HCM phenocopies.   We walked through the process of genetic testing. I explained to the patient that genetic testing is a probabilistic test dependent upon age and severity of presentation, presence of risk factors for HCM and importantly family history of HCM or sudden death in first-degree relatives.   The potential outcomes of genetic testing and subsequent management of at-risk family members were discussed to manage expectations-  If a mutation is not identified, then all first-degree relatives  should undergo regular screening for HCM. I emphasized that even if the genetic test is negative, it does not mean that he does not have HCM. A negative test result can be due to  limitations of the genetic test. Patient verbalized understanding of this.  There is also the likelihood of identifying a "Variant of unknown significance." This result means that the variant has not been detected in a statistically significant number of HCM patients and/or functional studies have not been performed to verify its pathogenicity. This VUS can be tested in the family to see if it segregates with disease. If a VUS is found, first-degree relatives should undergo regular clinical screening for HCM.  If a pathogenic variant is reported, then first-degree family members can get tested for this variant. If they test positive, it is likely they will develop HCM. Considering variable expression and incomplete penetrance associated with HCM, it is not possible to predict when they will manifest clinically with HCM. It is recommended that family members that test positive for the familial pathogenic variant pursue clinical screening for HCM. Family members that test negative for the familial mutation need not pursue periodic screening for HCM, but seek care if symptoms develop.  Impression  Jeniel has cardiac wall thickness suggestive of HCM at age 6 and is severely symptomatic, but does not report a family history of HCM or sudden death in his family. 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 His age of presentation and severity of symptoms is indicative of a genetic condition.   Genetic testing for HCM is recommended. This test should include the sarcomeric genes implicated in HCM as well as the genes for the HCM phenocopies, such as Fabry disease, Danon disease, WPW syndrome and familial transthyretin amyloidosis.  In addition, we discussed the protections afforded by the Genetic Information Non-Discrimination Act (GINA). I explained to the patient that GINA protects him from losing his employment or health insurance based on his genotype. However, these  protections do not cover life insurance and disability. Patient verbalized understanding of this and informs me that his children do not have life insurance.  Please note that the patient has not been counseled in this visit on personal, cultural, or ethical issues that he may face due to his heart condition.   Plan After a thorough discussion of the risk and benefits of genetic testing for HCM, Seaver states his intent to pursue genetic testing for HCM and signed the informed consent. Blood was drawn today for HCM genetic testing.   Sidney Ace, Ph.D, [facility] Clinical Molecular Geneticist

## 2022-01-21 ENCOUNTER — Other Ambulatory Visit: Payer: Self-pay | Admitting: Internal Medicine

## 2022-01-21 DIAGNOSIS — I421 Obstructive hypertrophic cardiomyopathy: Secondary | ICD-10-CM

## 2022-02-03 ENCOUNTER — Telehealth: Payer: Self-pay | Admitting: Internal Medicine

## 2022-02-03 NOTE — Telephone Encounter (Signed)
Called patient and he reported having upper right sided chest pain off and on every day and shortness of breath. He stated that it typically occurs with exertion and he is able to relieve the chest pain with rest. I spoke to Dr. Bing Matter regarding this patient's symptoms and he reviewed the chart and stated that the patient did not have CAD so he recommended that the patient follow up with Dr. Tomie China next week. An appointment was made for the patient to see Dr. Tomie China on 02/07/22 at 10:20pm. Patient was made aware of the appointment and had no further questions at this time.

## 2022-02-03 NOTE — Telephone Encounter (Signed)
Pt c/o of Chest Pain: STAT if CP now or developed within 24 hours  1. Are you having CP right now? No  2. Are you experiencing any other symptoms (ex. SOB, nausea, vomiting, sweating)? No  3. How long have you been experiencing CP? He states that this has been going on for a while now.   4. Is your CP continuous or coming and going? Coming and going   5. Have you taken Nitroglycerin? Yes.   Pt states that this is an ongoing issue that he would like to find a solution for. Please advise.  ?

## 2022-02-07 ENCOUNTER — Encounter: Payer: Self-pay | Admitting: Cardiology

## 2022-02-07 ENCOUNTER — Ambulatory Visit: Payer: BC Managed Care – PPO | Attending: Cardiology | Admitting: Cardiology

## 2022-02-07 VITALS — BP 120/64 | HR 76 | Ht 65.0 in | Wt 229.0 lb

## 2022-02-07 DIAGNOSIS — I4729 Other ventricular tachycardia: Secondary | ICD-10-CM

## 2022-02-07 DIAGNOSIS — E669 Obesity, unspecified: Secondary | ICD-10-CM

## 2022-02-07 DIAGNOSIS — I421 Obstructive hypertrophic cardiomyopathy: Secondary | ICD-10-CM | POA: Diagnosis not present

## 2022-02-07 NOTE — Patient Instructions (Signed)
Medication Instructions:  Your physician has recommended you make the following change in your medication:   Stop aspirin.  *If you need a refill on your cardiac medications before your next appointment, please call your pharmacy*   Lab Work: None ordered If you have labs (blood work) drawn today and your tests are completely normal, you will receive your results only by: MyChart Message (if you have MyChart) OR A paper copy in the mail If you have any lab test that is abnormal or we need to change your treatment, we will call you to review the results.   Testing/Procedures: None ordered   Follow-Up: At [facility], you and your health needs are our priority.  As part of our continuing mission to provide you with exceptional heart care, we have created designated Provider Care Teams.  These Care Teams include your primary Cardiologist (physician) and Advanced Practice Providers (APPs -  Physician Assistants and Nurse Practitioners) who all work together to provide you with the care you need, when you need it.  We recommend signing up for the patient portal called "MyChart".  Sign up information is provided on this After Visit Summary.  MyChart is used to connect with patients for Virtual Visits (Telemedicine).  Patients are able to view lab/test results, encounter notes, upcoming appointments, etc.  Non-urgent messages can be sent to your provider as well.   To learn more about what you can do with MyChart, go to [email or web address].    Your next appointment:   9 month(s)  The format for your next appointment:   In Person  Provider:   Rajan Revankar, MD    Other Instructions none  Important Information About Sugar       

## 2022-02-07 NOTE — Progress Notes (Signed)
Cardiology Office Note:    Date:  02/07/2022   ID:  Luke Wade, DOB 04-09-1980, MRN [id number]  PCP:  Kristen Loader, FNP  Cardiologist:  Jenean Lindau, MD   Referring MD: Kristen Loader, FNP    ASSESSMENT:    1. Obstructive hypertrophic cardiomyopathy ([place])   2. NSVT (nonsustained ventricular tachycardia) (HCC)   3. Obesity (BMI 35.0-39.9 without comorbidity)    PLAN:    In order of problems listed above:  Primary prevention stressed with the patient.  Importance of compliance with diet medication stressed any vocalized understanding. Chest pain: Atypical in etiology.  I reassured the patient about findings of the CT coronary angiography and is happy about it. Obstructive hypertrophic cardiomyopathy: Stable at this time.  Followed by electrophysiology colleagues.  No history of syncope or any such issues. History of nonsustained ventricular tachycardia: Managed by electrophysiology colleagues. Obesity: Weight reduction stressed diet emphasized.  He promises to do better. Patient will be seen in follow-up appointment in 6 months or earlier if the patient has any concerns    Medication Adjustments/Labs and Tests Ordered: Current medicines are reviewed at length with the patient today.  Concerns regarding medicines are outlined above.  Orders Placed This Encounter  Procedures   EKG 12-Lead   No orders of the defined types were placed in this encounter.    Chief Complaint  Patient presents with   Chest Pain     History of Present Illness:    Luke Wade is a 42 y.o. male.  Patient has past medical history of obstructive hypertrophic cardiomyopathy, nonsustained ventricular tachycardia and obesity.  He denies any problems at this time and takes care of activities of daily living.  Occasionally has chest pain not related to exertion.  He has coronary angiography in the past and CT coronary angiography recently with anatomically normal-appearing coronary arteries.   At the time of my evaluation, the patient is alert awake oriented and in no distress.  He denies any history of palpitations or syncope.  Past Medical History:  Diagnosis Date   Abnormal myocardial perfusion study 05/02/2017   Chest pain, rule out acute myocardial infarction 04/16/2017   Displacement of lumbar intervertebral disc without myelopathy 03/24/2021   Essential hypertension 04/16/2017   Gastroesophageal reflux disease without esophagitis 08/05/2018   Generalized anxiety disorder 03/24/2021   Mild recurrent major depression ([place]) 03/24/2021   Morbid obesity ([place]) 03/24/2021   NSVT (nonsustained ventricular tachycardia) ([place]) 05/18/2021   Obesity (BMI 35.0-39.9 without comorbidity) 10/05/2021   Obstructive hypertrophic cardiomyopathy (Luke Wade) 05/24/2021   Overweight 04/16/2017   Recurrent major depression in remission ([place]) 03/24/2021   Renal artery stenosis ([place]) 03/24/2021   Vasculitis ([place]) 03/24/2021    Past Surgical History:  Procedure Laterality Date   LEFT HEART CATH AND CORONARY ANGIOGRAPHY N/A 05/03/2017   Procedure: LEFT HEART CATH AND CORONARY ANGIOGRAPHY;  Surgeon: Leonie Man, MD;  Location: [facility];  Service: Cardiovascular;  Laterality: N/A;   NO PAST SURGERIES      Current Medications: Current Meds  Medication Sig   ALPRAZolam (XANAX) 0.5 MG tablet Take 0.25-0.5 mg by mouth every 4 (four) hours as needed for anxiety.   gabapentin (NEURONTIN) 300 MG capsule Take 300 mg by mouth daily as needed (pain).   indapamide (LOZOL) 1.25 MG tablet Take 1.25 mg by mouth every morning.   metoprolol succinate (TOPROL-XL) 50 MG 24 hr tablet Take 50 mg by mouth daily.   nitroGLYCERIN (NITROSTAT) 0.4 MG  SL tablet Place 0.4 mg under the tongue every 5 (five) minutes as needed for chest pain.   spironolactone (ALDACTONE) 50 MG tablet Take 50 mg by mouth daily.   valsartan (DIOVAN) 40 MG tablet Take 40 mg by mouth daily.   [DISCONTINUED] aspirin EC 81 MG tablet Take 81 mg by  mouth daily.     Allergies:   Contrast media [iodinated contrast media]   Social History   Socioeconomic History   Marital status: Married    Spouse name: Not on file   Number of children: Not on file   Years of education: Not on file   Highest education level: Not on file  Occupational History   Not on file  Tobacco Use   Smoking status: Former    Packs/day: 0.25    Types: Cigarettes   Smokeless tobacco: Never  Vaping Use   Vaping Use: Some days  Substance and Sexual Activity   Alcohol use: Yes    Comment: occ   Drug use: Not Currently    Types: Marijuana   Sexual activity: Not on file  Other Topics Concern   Not on file  Social History Narrative   Not on file   Social Determinants of Health   Financial Resource Strain: Not on file  Food Insecurity: Not on file  Transportation Needs: Not on file  Physical Activity: Not on file  Stress: Not on file  Social Connections: Not on file     Family History: The patient's family history includes Cancer in his father and mother.  ROS:   Please see the history of present illness.    All other systems reviewed and are negative.  EKGs/Labs/Other Studies Reviewed:    The following studies were reviewed today: EKG reveals sinus rhythm left ventricular hypertrophy and nonspecific ST-T changes   Recent Labs: 04/18/2021: Hemoglobin 14.5; Platelets 292 09/21/2021: BUN 19; Creatinine, Ser 1.09; Potassium 3.8; Sodium 137  Recent Lipid Panel    Component Value Date/Time   CHOL 184 05/03/2017 0701   TRIG 267 (H) 05/03/2017 0701   HDL 47 05/03/2017 0701   CHOLHDL 3.9 05/03/2017 0701   VLDL 53 (H) 05/03/2017 0701   LDLCALC 84 05/03/2017 0701    Physical Exam:    VS:  BP 120/64 (BP Location: Left Arm, Patient Position: Sitting, Cuff Size: Normal)   Pulse 76   Ht 5\' 5"  (1.651 m)   Wt 229 lb (103.9 kg)   SpO2 97%   BMI 38.11 kg/m     Wt Readings from Last 3 Encounters:  02/07/22 229 lb (103.9 kg)  12/15/21 223 lb  (101.2 kg)  10/05/21 222 lb 0.6 oz (100.7 kg)     GEN: Patient is in no acute distress HEENT: Normal NECK: No JVD; No carotid bruits LYMPHATICS: No lymphadenopathy CARDIAC: Hear sounds regular, 2/6 systolic murmur at the apex. RESPIRATORY:  Clear to auscultation without rales, wheezing or rhonchi  ABDOMEN: Soft, non-tender, non-distended MUSCULOSKELETAL:  No edema; No deformity  SKIN: Warm and dry NEUROLOGIC:  Alert and oriented x 3 PSYCHIATRIC:  Normal affect   Signed, Jenean Lindau, MD  02/07/2022 11:01 AM    [place]

## 2022-02-17 ENCOUNTER — Ambulatory Visit: Payer: BC Managed Care – PPO | Admitting: Internal Medicine

## 2022-03-04 ENCOUNTER — Ambulatory Visit (INDEPENDENT_AMBULATORY_CARE_PROVIDER_SITE_OTHER): Payer: BC Managed Care – PPO

## 2022-03-04 ENCOUNTER — Ambulatory Visit
Admission: EM | Admit: 2022-03-04 | Discharge: 2022-03-04 | Disposition: A | Discharge status: Home / Self Care | Payer: BC Managed Care – PPO | Attending: Internal Medicine | Admitting: Internal Medicine

## 2022-03-04 ENCOUNTER — Other Ambulatory Visit: Payer: Self-pay

## 2022-03-04 DIAGNOSIS — R0602 Shortness of breath: Secondary | ICD-10-CM

## 2022-03-04 DIAGNOSIS — J111 Influenza due to unidentified influenza virus with other respiratory manifestations: Secondary | ICD-10-CM | POA: Diagnosis not present

## 2022-03-04 DIAGNOSIS — R059 Cough, unspecified: Secondary | ICD-10-CM

## 2022-03-04 DIAGNOSIS — R051 Acute cough: Secondary | ICD-10-CM | POA: Diagnosis not present

## 2022-03-04 MED ORDER — PREDNISONE 20 MG PO TABS: 40.0000 mg | ORAL_TABLET | Freq: Every day | ORAL | 0 refills | Status: AC

## 2022-03-04 MED ORDER — BENZONATATE 100 MG PO CAPS: 100.0000 mg | ORAL_CAPSULE | Freq: Three times a day (TID) | ORAL | 0 refills | Status: DC | PRN

## 2022-03-04 NOTE — ED Provider Notes (Signed)
[facility]    CSN: [id number] Arrival date & time: 03/04/22  1005      History   Chief Complaint Chief Complaint  Patient presents with   Nasal Congestion    HPI Luke Wade is a 42 y.o. male.   Patient presents for nasal congestion, runny nose, dry cough, shortness of breath that has been present for approximately 1 week.  Patient reports that his daughter recently tested positive for influenza.  Reports shortness of breath is intermittent and mainly when he takes a deep breath.  Denies chest pain.  Denies sore throat, ear pain, nausea, vomiting, diarrhea, abdominal pain.  Patient also reports that his sense of smell has changed as "things do not smell the same" since symptoms started.  He has not taken any medications for symptoms.  Tmax at home was 101.     Past Medical History:  Diagnosis Date   Abnormal myocardial perfusion study 05/02/2017   Chest pain, rule out acute myocardial infarction 04/16/2017   Displacement of lumbar intervertebral disc without myelopathy 03/24/2021   Essential hypertension 04/16/2017   Gastroesophageal reflux disease without esophagitis 08/05/2018   Generalized anxiety disorder 03/24/2021   Mild recurrent major depression ([place]) 03/24/2021   Morbid obesity ([place]) 03/24/2021   NSVT (nonsustained ventricular tachycardia) ([place]) 05/18/2021   Obesity (BMI 35.0-39.9 without comorbidity) 10/05/2021   Obstructive hypertrophic cardiomyopathy ([place]) 05/24/2021   Overweight 04/16/2017   Recurrent major depression in remission ([place]) 03/24/2021   Renal artery stenosis ([place]) 03/24/2021   Vasculitis ([place]) 03/24/2021    Patient Active Problem List   Diagnosis Date Noted   Angina pectoris ([place]) 12/15/2021   Obesity (BMI 35.0-39.9 without comorbidity) 10/05/2021   Obstructive hypertrophic cardiomyopathy ([place]) 05/24/2021   NSVT (nonsustained ventricular tachycardia) ([place]) 05/18/2021   Displacement of lumbar intervertebral disc without myelopathy  03/24/2021   Generalized anxiety disorder 03/24/2021   Mild recurrent major depression ([place]) 03/24/2021   Morbid obesity ([place]) 03/24/2021   Recurrent major depression in remission ([place]) 03/24/2021   Renal artery stenosis ([facility]) 03/24/2021   Vasculitis ([place]) 03/24/2021   Gastroesophageal reflux disease without esophagitis 08/05/2018   Abnormal myocardial perfusion study 05/02/2017   Chest pain, rule out acute myocardial infarction 04/16/2017   Essential hypertension 04/16/2017   Overweight 04/16/2017    Past Surgical History:  Procedure Laterality Date   LEFT HEART CATH AND CORONARY ANGIOGRAPHY N/A 05/03/2017   Procedure: LEFT HEART CATH AND CORONARY ANGIOGRAPHY;  Surgeon: Leonie Man, MD;  Location: [facility];  Service: Cardiovascular;  Laterality: N/A;   NO PAST SURGERIES         Home Medications    Prior to Admission medications   Medication Sig Start Date End Date Taking? Authorizing Provider  benzonatate (TESSALON) 100 MG capsule Take 1 capsule (100 mg total) by mouth every 8 (eight) hours as needed for cough. 03/04/22  Yes Montravious Weigelt, Hildred Alamin E, FNP  predniSONE (DELTASONE) 20 MG tablet Take 2 tablets (40 mg total) by mouth daily for 5 days. 03/04/22 03/09/22 Yes Mavrik Bynum, Michele Rockers, FNP  ALPRAZolam Duanne Moron) 0.5 MG tablet Take 0.25-0.5 mg by mouth every 4 (four) hours as needed for anxiety. 01/03/22   [provider]  gabapentin (NEURONTIN) 300 MG capsule Take 300 mg by mouth daily as needed (pain).    [provider]  indapamide (LOZOL) 1.25 MG tablet Take 1.25 mg by mouth every morning. 01/05/21   [provider]  metoprolol succinate (TOPROL-XL) 50 MG 24 hr tablet Take  50 mg by mouth daily. 11/19/21   [provider]  nitroGLYCERIN (NITROSTAT) 0.4 MG SL tablet Place 0.4 mg under the tongue every 5 (five) minutes as needed for chest pain.    [provider]  spironolactone (ALDACTONE) 50 MG tablet Take 50 mg by mouth daily. 12/10/21    [provider]  valsartan (DIOVAN) 40 MG tablet Take 40 mg by mouth daily. 01/10/22   [provider]    Family History Family History  Problem Relation Age of Onset   Cancer Mother    Cancer Father     Social History Social History   Tobacco Use   Smoking status: Former    Packs/day: 0.25    Types: Cigarettes   Smokeless tobacco: Never  Vaping Use   Vaping Use: Some days  Substance Use Topics   Alcohol use: Yes    Comment: occ   Drug use: Not Currently    Types: Marijuana     Allergies   Contrast media [iodinated contrast media]   Review of Systems Review of Systems Per HPI  Physical Exam Triage Vital Signs ED Triage Vitals  Enc Vitals Group     BP 03/04/22 1135 130/76     Pulse Rate 03/04/22 1134 87     Resp 03/04/22 1134 18     Temp 03/04/22 1134 98.3 F (36.8 C)     Temp Source 03/04/22 1134 Oral     SpO2 03/04/22 1134 96 %     Weight --      Height --      Head Circumference --      Peak Flow --      Pain Score 03/04/22 1133 0     Pain Loc --      Pain Edu? --      Excl. in GC? --    No data found.  Updated Vital Signs BP 130/76   Pulse 87   Temp 98.3 F (36.8 C) (Oral)   Resp 18   SpO2 96%   Visual Acuity Right Eye Distance:   Left Eye Distance:   Bilateral Distance:    Right Eye Near:   Left Eye Near:    Bilateral Near:     Physical Exam Constitutional:      General: He is not in acute distress.    Appearance: Normal appearance. He is not toxic-appearing or diaphoretic.  HENT:     Head: Normocephalic and atraumatic.     Right Ear: Tympanic membrane and ear canal normal.     Left Ear: Tympanic membrane and ear canal normal.     Nose: Congestion present.     Mouth/Throat:     Mouth: Mucous membranes are moist.     Pharynx: No posterior oropharyngeal erythema.  Eyes:     Extraocular Movements: Extraocular movements intact.     Conjunctiva/sclera: Conjunctivae normal.     Pupils: Pupils are equal, round,  and reactive to light.  Cardiovascular:     Rate and Rhythm: Normal rate and regular rhythm.     Pulses: Normal pulses.     Heart sounds: Normal heart sounds.  Pulmonary:     Effort: Pulmonary effort is normal. No respiratory distress.     Breath sounds: Normal breath sounds. No stridor. No wheezing, rhonchi or rales.  Chest:     Chest wall: No tenderness.  Abdominal:     General: Abdomen is flat. Bowel sounds are normal.     Palpations: Abdomen is soft.  Musculoskeletal:        General: Normal range of motion.     Cervical back: Normal range of motion.  Skin:    General: Skin is warm and dry.  Neurological:     General: No focal deficit present.     Mental Status: He is alert and oriented to person, place, and time. Mental status is at baseline.  Psychiatric:        Mood and Affect: Mood normal.        Behavior: Behavior normal.      UC Treatments / Results  Labs (all labs ordered are listed, but only abnormal results are displayed) Labs Reviewed - No data to display  EKG   Radiology DG Chest 2 View  Result Date: 03/04/2022 CLINICAL DATA:  Shortness of breath, cough. EXAM: CHEST - 2 VIEW COMPARISON:  January 11, 2022. FINDINGS: The heart size and mediastinal contours are within normal limits. Both lungs are clear. The visualized skeletal structures are unremarkable. IMPRESSION: No active cardiopulmonary disease. Electronically Signed   By: Marijo Conception M.D.   On: 03/04/2022 12:15    Procedures Procedures (including critical care time)  Medications Ordered in UC Medications - No data to display  Initial Impression / Assessment and Plan / UC Course  I have reviewed the triage vital signs and the nursing notes.  Pertinent labs & imaging results that were available during my care of the patient were reviewed by me and considered in my medical decision making (see chart for details).     Patient most likely has influenza given close exposure.  Do not have flu  testing capabilities in urgent care at this time, although given duration of symptoms it would not change treatment.  Chest x-ray completed due to patient's reported shortness of breath which was negative for any acute cardiopulmonary abnormality.  Suspect inflammation in chest causing discomfort.  Will avoid albuterol given patient's history of tachycardia.  Will prescribe prednisone given no contraindications to prednisone noted in patient's history and patient stating that he has taken this before and tolerated well.  Benzonatate prescribed to take as needed for cough.  Discussed supportive care and symptom management with patient.  Vital signs are stable and there is no tachypnea so patient is safe for discharge.  Discussed return precautions.  Patient verbalized understanding and was agreeable with plan. Final Clinical Impressions(s) / UC Diagnoses   Final diagnoses:  Influenza  Acute cough  Shortness of breath     Discharge Instructions      You most likely have influenza given your close exposure.  Your chest x-ray was normal.  I have prescribed prednisone and cough medication to help alleviate symptoms.  Please follow-up if any symptoms persist or worsen.     ED Prescriptions     Medication Sig Dispense Auth. Provider   predniSONE (DELTASONE) 20 MG tablet Take 2 tablets (40 mg total) by mouth daily for 5 days. 10 tablet [place], [place] E, [place]   benzonatate (TESSALON) 100 MG capsule Take 1 capsule (100 mg total) by mouth every 8 (eight) hours as needed for cough. 21 capsule [place], Michele Rockers, [place]      PDMP not reviewed this encounter.   Teodora Medici, [place] 03/04/22 1251

## 2022-03-04 NOTE — ED Triage Notes (Signed)
Pt reports being exposed to the flu.   States he has congestion, runny nose, SOB x 1 WEEK.   States he has loss some of his sense of smell.

## 2022-03-04 NOTE — Discharge Instructions (Signed)
You most likely have influenza given your close exposure.  Your chest x-ray was normal.  I have prescribed prednisone and cough medication to help alleviate symptoms.  Please follow-up if any symptoms persist or worsen.

## 2022-03-15 ENCOUNTER — Telehealth: Payer: Self-pay

## 2022-03-15 NOTE — Telephone Encounter (Signed)
   Name: Luke Wade  DOB: Oct 30, 1980  MRN: [id number]   Primary Cardiologist: Shirlee More, MD  Chart reviewed as part of pre-operative protocol coverage. Patient was contacted 03/15/2022 in reference to pre-operative risk assessment for pending surgery as outlined below.  Luke Wade was last seen on 02/07/2022 by Dr. Geraldo Pitter.  Since that day, Luke Wade has done well.  No new symptoms or concerns.  He is able to complete greater than 4 METS without difficulty.  Therefore, based on ACC/AHA guidelines, the patient would be at acceptable risk for the planned procedure without further cardiovascular testing.   The patient was advised that if he develops new symptoms prior to surgery to contact our office to arrange for a follow-up visit, and he verbalized understanding.  I will route this recommendation to the requesting party via Epic fax function and remove from pre-op pool. Please call with questions.  Lenna Sciara, NP 03/15/2022, 9:38 AM

## 2022-03-15 NOTE — Telephone Encounter (Signed)
   Pre-operative Risk Assessment    Patient Name: [name]  DOB: 1980/04/30 MRN: [id number]      Request for Surgical Clearance    Procedure:   colonoscopy  Date of Surgery:  Clearance 06/09/22                                 Surgeon:  Dr. Irine Seal Group or Practice Name:  [facility] Phone number:  [phone] Fax number:  [phone]   Type of Clearance Requested:   - Medical    Type of Anesthesia:   propofol   Additional requests/questions:    SignedLowella Grip   03/15/2022, 9:18 AM

## 2022-03-21 ENCOUNTER — Encounter: Payer: BC Managed Care – PPO | Admitting: Genetic Counselor

## 2022-04-03 ENCOUNTER — Other Ambulatory Visit: Payer: Self-pay | Admitting: Gastroenterology

## 2022-04-18 ENCOUNTER — Other Ambulatory Visit: Payer: Self-pay | Admitting: Cardiology

## 2022-04-19 ENCOUNTER — Ambulatory Visit: Payer: BC Managed Care – PPO | Attending: Genetic Counselor | Admitting: Genetic Counselor

## 2022-04-19 ENCOUNTER — Telehealth: Payer: Self-pay | Admitting: Internal Medicine

## 2022-04-19 DIAGNOSIS — I421 Obstructive hypertrophic cardiomyopathy: Secondary | ICD-10-CM

## 2022-04-19 NOTE — Telephone Encounter (Signed)
No Pathogenetic variant has meeting with genetics team to discuss today.

## 2022-04-24 NOTE — Progress Notes (Signed)
Referring Provider: Rudean Haskell, MD   Post-test Genetic Consultation notes  Luke Wade is here today for his HCM post-test genetic consult.   Luke Wade notes no major changes to his medical status or that of his family members since we last met.   I informed Luke Wade that he does not have a pathogenic variant for HCM. I explained the reasons as to why his genetic test was negative. Clinical testing is restricted to the coding regions of the gene thus precluding the regulatory control elements that may impact gene function and hence protein activity. I emphasized that though he has a negative test, it is still a genetic condition.   As HCM is an autosomal dominant disorder his two daughters, ages 43 and 25 are at a 50% risk of inheriting HCM. It would be appropriate for his first-degree relatives, namely his 89 y.o sister to seek regular cardiology surveillance for HCM by echocardiogram and EKG once every 3-5 years until the age of 92. His daughters can start getting screened before they hit puberty. Guidelines for screening state that kids should be screened annually in their teenage years until the age of 20 and then the frequency can be increased to once every 3-5 years. Luke Wade verbalized understanding of this.  Also informed him that HCM patients that do not have a sarcomeric mutation usually have a good prognosis. He was happy to hear this.  Please note that the patient has not been counseled in this visit on personal, cultural, or ethical issues that he may face due to his heart condition.     Lattie Corns, Ph.D, [facility] Clinical Molecular Geneticist

## 2022-05-20 ENCOUNTER — Other Ambulatory Visit: Payer: Self-pay | Admitting: Cardiology

## 2022-06-02 ENCOUNTER — Encounter (HOSPITAL_COMMUNITY): Payer: Self-pay | Admitting: Gastroenterology

## 2022-06-09 ENCOUNTER — Encounter (HOSPITAL_COMMUNITY)
Admission: RE | Disposition: A | Discharge status: Home / Self Care | Payer: Self-pay | Source: Home / Self Care | Attending: Gastroenterology

## 2022-06-09 ENCOUNTER — Ambulatory Visit (HOSPITAL_COMMUNITY): Payer: BC Managed Care – PPO | Admitting: Anesthesiology

## 2022-06-09 ENCOUNTER — Other Ambulatory Visit: Payer: Self-pay

## 2022-06-09 ENCOUNTER — Ambulatory Visit (HOSPITAL_COMMUNITY)
Admission: RE | Admit: 2022-06-09 | Discharge: 2022-06-09 | Disposition: A | Discharge status: Home / Self Care | Payer: BC Managed Care – PPO | Attending: Gastroenterology | Admitting: Gastroenterology

## 2022-06-09 ENCOUNTER — Encounter (HOSPITAL_COMMUNITY): Payer: Self-pay | Admitting: Gastroenterology

## 2022-06-09 DIAGNOSIS — K5730 Diverticulosis of large intestine without perforation or abscess without bleeding: Secondary | ICD-10-CM | POA: Diagnosis not present

## 2022-06-09 DIAGNOSIS — Z79899 Other long term (current) drug therapy: Secondary | ICD-10-CM | POA: Diagnosis not present

## 2022-06-09 DIAGNOSIS — Z09 Encounter for follow-up examination after completed treatment for conditions other than malignant neoplasm: Secondary | ICD-10-CM | POA: Insufficient documentation

## 2022-06-09 DIAGNOSIS — Z87891 Personal history of nicotine dependence: Secondary | ICD-10-CM | POA: Insufficient documentation

## 2022-06-09 DIAGNOSIS — R140 Abdominal distension (gaseous): Secondary | ICD-10-CM | POA: Diagnosis present

## 2022-06-09 DIAGNOSIS — Z800 Family history of malignant neoplasm of digestive organs: Secondary | ICD-10-CM | POA: Insufficient documentation

## 2022-06-09 DIAGNOSIS — I10 Essential (primary) hypertension: Secondary | ICD-10-CM | POA: Diagnosis not present

## 2022-06-09 DIAGNOSIS — Z6838 Body mass index (BMI) 38.0-38.9, adult: Secondary | ICD-10-CM | POA: Insufficient documentation

## 2022-06-09 DIAGNOSIS — K921 Melena: Secondary | ICD-10-CM | POA: Diagnosis not present

## 2022-06-09 DIAGNOSIS — I421 Obstructive hypertrophic cardiomyopathy: Secondary | ICD-10-CM | POA: Diagnosis not present

## 2022-06-09 DIAGNOSIS — K219 Gastro-esophageal reflux disease without esophagitis: Secondary | ICD-10-CM | POA: Diagnosis not present

## 2022-06-09 HISTORY — PX: ESOPHAGOGASTRODUODENOSCOPY (EGD) WITH PROPOFOL: SHX5813

## 2022-06-09 HISTORY — PX: COLONOSCOPY WITH PROPOFOL: SHX5780

## 2022-06-09 SURGERY — ESOPHAGOGASTRODUODENOSCOPY (EGD) WITH PROPOFOL
Anesthesia: Monitor Anesthesia Care

## 2022-06-09 MED ORDER — PROPOFOL 10 MG/ML IV BOLUS
INTRAVENOUS | Status: DC | PRN
  Administered 2022-06-09: 40 mg via INTRAVENOUS
  Administered 2022-06-09 (×2): 30 mg via INTRAVENOUS

## 2022-06-09 MED ORDER — LACTATED RINGERS IV SOLN: INTRAVENOUS | Status: DC

## 2022-06-09 MED ORDER — PROPOFOL 500 MG/50ML IV EMUL
INTRAVENOUS | Status: DC | PRN
  Administered 2022-06-09: 125 ug/kg/min via INTRAVENOUS

## 2022-06-09 MED ORDER — PROPOFOL 500 MG/50ML IV EMUL
INTRAVENOUS | Status: AC
  Filled 2022-06-09: qty 50

## 2022-06-09 MED ORDER — SODIUM CHLORIDE 0.9 % IV SOLN: INTRAVENOUS | Status: DC

## 2022-06-09 MED ORDER — PROPOFOL 10 MG/ML IV BOLUS
INTRAVENOUS | Status: AC
  Filled 2022-06-09: qty 20

## 2022-06-09 SURGICAL SUPPLY — 25 items

## 2022-06-09 NOTE — Anesthesia Postprocedure Evaluation (Signed)
Anesthesia Post Note  Patient: Luke Wade  Procedure(s) Performed: ESOPHAGOGASTRODUODENOSCOPY (EGD) WITH PROPOFOL COLONOSCOPY WITH PROPOFOL     Patient location during evaluation: PACU Anesthesia Type: MAC Level of consciousness: awake and alert Pain management: pain level controlled Vital Signs Assessment: post-procedure vital signs reviewed and stable Respiratory status: spontaneous breathing, nonlabored ventilation, respiratory function stable and patient connected to nasal cannula oxygen Cardiovascular status: stable and blood pressure returned to baseline Postop Assessment: no apparent nausea or vomiting Anesthetic complications: no   No notable events documented.  Last Vitals:  Vitals:   06/09/22 0930 06/09/22 0940  BP: (!) 161/91 (!) 155/101  Pulse: 83 80  Resp: 18 17  Temp:    SpO2: 100% 100%    Last Pain:  Vitals:   06/09/22 0940  TempSrc:   PainSc: 0-No pain                 Lyndy Russman

## 2022-06-09 NOTE — Anesthesia Preprocedure Evaluation (Addendum)
Anesthesia Evaluation  Patient identified by MRN, date of birth, ID band Patient awake    Reviewed: Allergy & Precautions, H&P , NPO status , Patient's Chart, lab work & pertinent test results  Airway Mallampati: II  TM Distance: >3 FB Neck ROM: Full    Dental no notable dental hx. (+) Poor Dentition, Missing, Dental Advisory Given   Pulmonary neg pulmonary ROS, former smoker   Pulmonary exam normal breath sounds clear to auscultation       Cardiovascular Exercise Tolerance: Good hypertension, Pt. on medications and Pt. on home beta blockers + angina  + Peripheral Vascular Disease  negative cardio ROS Normal cardiovascular exam+ dysrhythmias  Rhythm:Regular Rate:Normal  ECHO 8/23 1. There is an intracavitary gradient with no outflow tract obstruction  seen. There is no significant flow acceleration visualized by color  doppler at the level of the LVOT. Peak intracavitary gradient appears to  be about at the mid LV level,  although signal is contaminated by MR signal. No SAM visualized. Left  ventricular ejection fraction, by estimation, is >75%. The left ventricle  has hyperdynamic function. There is moderate left ventricular hypertrophy.  Left ventricular diastolic  parameters are consistent with Grade I diastolic dysfunction (impaired  relaxation).   2. The mitral valve is normal in structure. Trivial mitral valve  regurgitation.   3. The aortic valve is tricuspid. Aortic valve regurgitation is not  visualized. No aortic stenosis is present.   4. The inferior vena cava is normal in size with greater than 50%  respiratory variability, suggesting right atrial pressure of 3 mmHg.     Neuro/Psych  PSYCHIATRIC DISORDERS Anxiety Depression    negative neurological ROS  negative psych ROS   GI/Hepatic negative GI ROS, Neg liver ROS,GERD  Medicated,,  Endo/Other  negative endocrine ROS  Morbid obesity   Renal/GU negative Renal ROS  negative genitourinary   Musculoskeletal negative musculoskeletal ROS (+)    Abdominal  (+) + obese  Peds negative pediatric ROS (+)  Hematology negative hematology ROS (+)   Anesthesia Other Findings   Reproductive/Obstetrics negative OB ROS                             Anesthesia Physical Anesthesia Plan  ASA: 3  Anesthesia Plan: MAC   Post-op Pain Management:    Induction: Intravenous  PONV Risk Score and Plan: Propofol infusion  Airway Management Planned: Mask, Natural Airway and Simple Face Mask  Additional Equipment: None  Intra-op Plan:   Post-operative Plan:   Informed Consent: I have reviewed the patients History and Physical, chart, labs and discussed the procedure including the risks, benefits and alternatives for the proposed anesthesia with the patient or authorized representative who has indicated his/her understanding and acceptance.       Plan Discussed with: Anesthesiologist  Anesthesia Plan Comments:         Anesthesia Quick Evaluation

## 2022-06-09 NOTE — Op Note (Signed)
[facility] Patient Name: Luke Wade Procedure Date: 06/09/2022 MRN: [id number] Attending MD: Jeani Hawking , MD, [id number] Date of Birth: 12-Sep-1980 CSN: [id number] Age: 42 Admit Type: Outpatient Procedure:                Colonoscopy Indications:              Hematochezia Providers:                Jeani Hawking, MD, Lorenza Evangelist, RN, Irene Shipper,                            Technician, Raelyn Number CRNA, CRNA Referring MD:              Medicines:                 Complications:            No immediate complications. Estimated Blood Loss:     Estimated blood loss: none. Procedure:                Pre-Anesthesia Assessment:                           - Prior to the procedure, a History and Physical                            was performed, and patient medications and                            allergies were reviewed. The patient's tolerance of                            previous anesthesia was also reviewed. The risks                            and benefits of the procedure and the sedation                            options and risks were discussed with the patient.                            All questions were answered, and informed consent                            was obtained. Prior Anticoagulants: The patient has                            taken no anticoagulant or antiplatelet agents. ASA                            Grade Assessment: III - A patient with severe                            systemic disease. After reviewing the risks and                            benefits,  the patient was deemed in satisfactory                            condition to undergo the procedure.                           - Sedation was administered by an anesthesia                            professional. Deep sedation was attained.                           After obtaining informed consent, the colonoscope                            was passed under direct vision. Throughout the                             procedure, the patient's blood pressure, pulse, and                            oxygen saturations were monitored continuously. The                            [id number]-HQ190L ([id number]) Olympus colonoscope was                            introduced through the anus and advanced to the the                            cecum, identified by appendiceal orifice and                            ileocecal valve. The colonoscopy was performed                            without difficulty. The patient tolerated the                            procedure well. The quality of the bowel                            preparation was evaluated using the BBPS [facility]                            Bowel Preparation Scale) with scores of: Right                            Colon = 3 (entire mucosa seen well with no residual                            staining, small fragments of stool or opaque  liquid), Transverse Colon = 3 (entire mucosa seen                            well with no residual staining, small fragments of                            stool or opaque liquid) and Left Colon = 3 (entire                            mucosa seen well with no residual staining, small                            fragments of stool or opaque liquid). The total                            BBPS score equals 9. The quality of the bowel                            preparation was good. The ileocecal valve,                            appendiceal orifice, and rectum were photographed. Scope In: 8:59:41 AM Scope Out: 9:14:46 AM Scope Withdrawal Time: 0 hours 12 minutes 52 seconds  Total Procedure Duration: 0 hours 15 minutes 5 seconds  Findings:      A few medium-mouthed diverticula were found in the ascending colon and       cecum. Impression:               - Diverticulosis in the ascending colon and in the                            cecum.                           - No specimens collected. Moderate  Sedation:      Not Applicable - Patient had care per Anesthesia. Recommendation:           - Patient has a contact number available for                            emergencies. The signs and symptoms of potential                            delayed complications were discussed with the                            patient. Return to normal activities tomorrow.                            Written discharge instructions were provided to the                            patient.                           -  Resume previous diet.                           - Continue present medications.                           - Repeat colonoscopy in 10 years for screening                            purposes. Procedure Code(s):        --- Professional ---                           [phone], Colonoscopy, flexible; diagnostic, including                            collection of specimen(s) by brushing or washing,                            when performed (separate procedure) Diagnosis Code(s):        --- Professional ---                           K92.1, Melena (includes Hematochezia)                           K57.30, Diverticulosis of large intestine without                            perforation or abscess without bleeding CPT copyright 2022 American Medical Association. All rights reserved. The codes documented in this report are preliminary and upon coder review may  be revised to meet current compliance requirements. Jeani Hawking, MD Jeani Hawking, MD 06/09/2022 9:23:56 AM This report has been signed electronically. Number of Addenda: 0

## 2022-06-09 NOTE — Transfer of Care (Signed)
Immediate Anesthesia Transfer of Care Note  Patient: Luke Wade  Procedure(s) Performed: ESOPHAGOGASTRODUODENOSCOPY (EGD) WITH PROPOFOL COLONOSCOPY WITH PROPOFOL  Patient Location: PACU and Endoscopy Unit  Anesthesia Type:MAC  Level of Consciousness: awake, alert , and patient cooperative  Airway & Oxygen Therapy: Patient Spontanous Breathing and Patient connected to face mask oxygen  Post-op Assessment: Report given to RN, Post -op Vital signs reviewed and stable, and BP 198/88   Pulse 94  Pulse Ox 100% Resp 20  Post vital signs: Reviewed and stable  Last Vitals:  Vitals Value Taken Time  BP    Temp    Pulse    Resp    SpO2      Last Pain:  Vitals:   06/09/22 0833  TempSrc: Temporal  PainSc: 0-No pain         Complications: No notable events documented.

## 2022-06-09 NOTE — H&P (Signed)
Luke Wade HPI: This 42 year old black male that presents to the office for evaluation of gas and bloating that he has had over the last 6 months. On 2 occasions within the last month he has noted bright red blood in the stool with some dark-colored burgundy stools. He denies any history of melena. Since his dose of Indapamide has been increased he has had some abdominal discomfort but denies having any problems with acid reflux or active acid regurgitation. He has had some chest pain on exertion that he feels is related to his cardiomyopathy and this improves with the use of sublingual Nitroglycerin. On an average he has 2 BM's per day. He has a good appetite and his weight has been stable. He denies having any complaints of abdominal pain, nausea, vomiting, dysphagia or odynophagia. He denies having a family history of celiac sprue or IBD. His mother died at the age of 66 of colon cancer shortly after she had after she was diagnosed. His calcium cardiac score was zero on 12/29/2021.  Past Medical History:  Diagnosis Date   Abnormal myocardial perfusion study 05/02/2017   Chest pain, rule out acute myocardial infarction 04/16/2017   Displacement of lumbar intervertebral disc without myelopathy 03/24/2021   Essential hypertension 04/16/2017   Gastroesophageal reflux disease without esophagitis 08/05/2018   Generalized anxiety disorder 03/24/2021   Mild recurrent major depression (HCC) 03/24/2021   Morbid obesity (HCC) 03/24/2021   NSVT (nonsustained ventricular tachycardia) (HCC) 05/18/2021   Obesity (BMI 35.0-39.9 without comorbidity) 10/05/2021   Obstructive hypertrophic cardiomyopathy (HCC) 05/24/2021   Overweight 04/16/2017   Recurrent major depression in remission (HCC) 03/24/2021   Renal artery stenosis (HCC) 03/24/2021   Vasculitis (HCC) 03/24/2021    Past Surgical History:  Procedure Laterality Date   LEFT HEART CATH AND CORONARY ANGIOGRAPHY N/A 05/03/2017   Procedure: LEFT HEART CATH AND  CORONARY ANGIOGRAPHY;  Surgeon: Marykay Lex, MD;  Location: [facility] INVASIVE CV LAB;  Service: Cardiovascular;  Laterality: N/A;   NO PAST SURGERIES      Family History  Problem Relation Age of Onset   Cancer Mother    Cancer Father     Social History:  reports that he has quit smoking. His smoking use included cigarettes. He smoked an average of .25 packs per day. He has never used smokeless tobacco. He reports current alcohol use. He reports that he does not currently use drugs after having used the following drugs: Marijuana.  Allergies:  Allergies  Allergen Reactions   Contrast Media [Iodinated Contrast Media] Hives    Medications: Scheduled: Continuous:  No results found for this or any previous visit (from the past 24 hour(s)).   No results found.  ROS:  As stated above in the HPI otherwise negative.  There were no vitals taken for this visit.    PE: Gen: NAD, Alert and Oriented HEENT:  [place]/AT, EOMI Neck: Supple, no LAD Lungs: CTA Bilaterally CV: RRR without M/G/R ABD: Soft, NTND, +BS Ext: No C/C/E  Assessment/Plan: 1) Hematochezia. 2) Bloating.  Plan: 1) EGD/colonoscopy.  Laura-Lee Villegas D 06/09/2022, 8:34 AM

## 2022-06-09 NOTE — Anesthesia Procedure Notes (Signed)
Procedure Name: MAC Date/Time: 06/09/2022 8:51 AM  Performed by: Elyn Peers, CRNAPre-anesthesia Checklist: Patient identified, Emergency Drugs available, Suction available, Patient being monitored and Timeout performed Oxygen Delivery Method: Simple face mask Preoxygenation: POM used. Placement Confirmation: positive ETCO2

## 2022-06-09 NOTE — Op Note (Signed)
[facility] Patient Name: Luke Wade Procedure Date: 06/09/2022 MRN: [id number] Attending MD: Jeani Hawking , MD, [id number] Date of Birth: 09-11-1980 CSN: [id number] Age: 42 Admit Type: Outpatient Procedure:                Upper GI endoscopy Indications:              Abdominal distention, Abdominal bloating Providers:                Jeani Hawking, MD, Lorenza Evangelist, RN, Irene Shipper,                            Technician, Lynnell Jude. Freida Busman CRNA, CRNA Referring MD:              Medicines:                Propofol per Anesthesia Complications:            No immediate complications. Estimated Blood Loss:     Estimated blood loss: none. Procedure:                Pre-Anesthesia Assessment:                           - Prior to the procedure, a History and Physical                            was performed, and patient medications and                            allergies were reviewed. The patient's tolerance of                            previous anesthesia was also reviewed. The risks                            and benefits of the procedure and the sedation                            options and risks were discussed with the patient.                            All questions were answered, and informed consent                            was obtained. Prior Anticoagulants: The patient has                            taken no anticoagulant or antiplatelet agents. ASA                            Grade Assessment: III - A patient with severe                            systemic disease. After reviewing the risks and  benefits, the patient was deemed in satisfactory                            condition to undergo the procedure.                           - Sedation was administered by an anesthesia                            professional. Deep sedation was attained.                           After obtaining informed consent, the endoscope was                             passed under direct vision. Throughout the                            procedure, the patient's blood pressure, pulse, and                            oxygen saturations were monitored continuously. The                            [id number]-HQ190L ([id number]) Olympus colonoscope was                            introduced through the mouth, and advanced to the                            second part of duodenum. The upper GI endoscopy was                            accomplished without difficulty. The patient                            tolerated the procedure well. Scope In: Scope Out: Findings:      The esophagus was normal.      The stomach was normal.      The examined duodenum was normal. Impression:               - Normal esophagus.                           - Normal stomach.                           - Normal examined duodenum.                           - No specimens collected. Moderate Sedation:      Not Applicable - Patient had care per Anesthesia. Recommendation:           - Patient has a contact number available for  emergencies. The signs and symptoms of potential                            delayed complications were discussed with the                            patient. Return to normal activities tomorrow.                            Written discharge instructions were provided to the                            patient.                           - Resume previous diet.                           - Continue present medications. Procedure Code(s):        --- Professional ---                           [phone], Esophagogastroduodenoscopy, flexible,                            transoral; diagnostic, including collection of                            specimen(s) by brushing or washing, when performed                            (separate procedure) Diagnosis Code(s):        --- Professional ---                           R14.0, Abdominal distension  (gaseous) CPT copyright 2022 American Medical Association. All rights reserved. The codes documented in this report are preliminary and upon coder review may  be revised to meet current compliance requirements. Jeani Hawking, MD Jeani Hawking, MD 06/09/2022 9:21:46 AM This report has been signed electronically. Number of Addenda: 0

## 2022-06-13 ENCOUNTER — Encounter (HOSPITAL_COMMUNITY): Payer: Self-pay | Admitting: Gastroenterology

## 2022-07-07 ENCOUNTER — Ambulatory Visit: Payer: BC Managed Care – PPO | Admitting: Cardiology

## 2022-07-07 ENCOUNTER — Encounter: Payer: Self-pay | Admitting: Cardiology

## 2022-07-07 VITALS — BP 145/89 | HR 78 | Ht 65.0 in | Wt 230.0 lb

## 2022-07-07 DIAGNOSIS — I119 Hypertensive heart disease without heart failure: Secondary | ICD-10-CM

## 2022-07-07 DIAGNOSIS — I421 Obstructive hypertrophic cardiomyopathy: Secondary | ICD-10-CM

## 2022-07-07 DIAGNOSIS — I10 Essential (primary) hypertension: Secondary | ICD-10-CM

## 2022-07-07 DIAGNOSIS — E66812 Obesity, class 2: Secondary | ICD-10-CM

## 2022-07-07 DIAGNOSIS — I209 Angina pectoris, unspecified: Secondary | ICD-10-CM

## 2022-07-07 MED ORDER — METOPROLOL SUCCINATE ER 100 MG PO TB24: 100.0000 mg | ORAL_TABLET | Freq: Every day | ORAL | 1 refills | Status: DC

## 2022-07-07 MED ORDER — VERAPAMIL HCL ER 180 MG PO TBCR
180.0000 mg | EXTENDED_RELEASE_TABLET | Freq: Every day | ORAL | 2 refills | Status: DC
Start: 2022-07-07 — End: 2022-08-31

## 2022-07-07 NOTE — Progress Notes (Unsigned)
Primary Physician/Referring:  Soundra Pilon, FNP  Patient ID: Luke Wade, male    DOB: 1980/04/25, 42 y.o.   MRN: [id number]  Chief Complaint  Patient presents with   HOCM (hypertrophic obstructive cardiomyopathy) [facility])   New Patient (Initial Visit)   HPI:    Luke Wade  is a 41 y.o. African-American male patient who has been diagnosed with HOCM, moderate obesity, primary hypertension, who was being followed by [facility] heart care wanted to make a switch and establish with me as his wife also sees me.  Patient states that he continues to have chest pain with exertion activity.  He does get relieved by taking sublingual nitroglycerin.  He is concerned that he may have significant coronary disease.  Denies dyspnea, PND or orthopnea.  Past Medical History:  Diagnosis Date   Abnormal myocardial perfusion study 05/02/2017   Chest pain, rule out acute myocardial infarction 04/16/2017   Displacement of lumbar intervertebral disc without myelopathy 03/24/2021   Essential hypertension 04/16/2017   Gastroesophageal reflux disease without esophagitis 08/05/2018   Generalized anxiety disorder 03/24/2021   Mild recurrent major depression (HCC) 03/24/2021   Morbid obesity (HCC) 03/24/2021   NSVT (nonsustained ventricular tachycardia) (HCC) 05/18/2021   Obesity (BMI 35.0-39.9 without comorbidity) 10/05/2021   Obstructive hypertrophic cardiomyopathy (HCC) 05/24/2021   Overweight 04/16/2017   Recurrent major depression in remission (HCC) 03/24/2021   Renal artery stenosis (HCC) 03/24/2021   Vasculitis (HCC) 03/24/2021   Past Surgical History:  Procedure Laterality Date   COLONOSCOPY WITH PROPOFOL N/A 06/09/2022   Procedure: COLONOSCOPY WITH PROPOFOL;  Surgeon: Jeani Hawking, MD;  Location: [facility];  Service: Gastroenterology;  Laterality: N/A;   ESOPHAGOGASTRODUODENOSCOPY (EGD) WITH PROPOFOL N/A 06/09/2022   Procedure: ESOPHAGOGASTRODUODENOSCOPY (EGD) WITH PROPOFOL;  Surgeon: Jeani Hawking, MD;   Location: [facility] ENDOSCOPY;  Service: Gastroenterology;  Laterality: N/A;   LEFT HEART CATH AND CORONARY ANGIOGRAPHY N/A 05/03/2017   Procedure: LEFT HEART CATH AND CORONARY ANGIOGRAPHY;  Surgeon: Marykay Lex, MD;  Location: [facility] INVASIVE CV LAB;  Service: Cardiovascular;  Laterality: N/A;   NO PAST SURGERIES     Family History  Problem Relation Age of Onset   Cancer Mother    Cancer Father    Hypertension Maternal Grandmother    Hypertension Maternal Grandfather    Hypertension Paternal Grandmother    Diabetes Paternal Grandmother    Diabetes Paternal Grandfather    Hypertension Paternal Grandfather     Social History   Tobacco Use   Smoking status: Former    Packs/day: .25    Types: Cigarettes   Smokeless tobacco: Never   Tobacco comments:    2022  Substance Use Topics   Alcohol use: Yes    Comment: occ   Marital Status: Married  ROS  Review of Systems  Cardiovascular:  Positive for chest pain. Negative for dyspnea on exertion and leg swelling.   Objective      07/07/2022    9:53 AM 06/09/2022    9:40 AM 06/09/2022    9:30 AM  Vitals with BMI  Height 5\' 5"     Weight 230 lbs    BMI 38.27    Systolic 145 155 086  Diastolic 89 101 91  Pulse 78 80 83   Blood pressure (!) 145/89, pulse 78, height 5\' 5"  (1.651 m), weight 230 lb (104.3 kg), SpO2 97 %.   Physical Exam Constitutional:      Appearance: He is obese.  Neck:     Vascular: No  JVD.  Cardiovascular:     Rate and Rhythm: Normal rate and regular rhythm.     Pulses: Intact distal pulses.     Heart sounds: S1 normal and S2 normal. Murmur heard.     Midsystolic murmur is present with a grade of 2/6 at the upper right sternal border radiating to the neck. No significant change with Valsalva     No gallop.  Pulmonary:     Effort: Pulmonary effort is normal.     Breath sounds: Normal breath sounds.  Abdominal:     General: Bowel sounds are normal.     Palpations: Abdomen is soft.  Musculoskeletal:     Right  lower leg: No edema.     Left lower leg: No edema.     Laboratory examination:   Recent Labs    09/21/21 0000  NA 137  K 3.8  CL 98  CO2 25  GLUCOSE 80  BUN 19  CREATININE 1.09  CALCIUM 10.0    Lab Results  Component Value Date   GLUCOSE 80 09/21/2021   NA 137 09/21/2021   K 3.8 09/21/2021   CL 98 09/21/2021   CO2 25 09/21/2021   BUN 19 09/21/2021   CREATININE 1.09 09/21/2021   EGFR 87 09/21/2021   CALCIUM 10.0 09/21/2021   PROT 7.5 04/28/2019   ALBUMIN 4.1 04/28/2019   BILITOT 0.5 04/28/2019   ALKPHOS 53 04/28/2019   AST 21 04/28/2019   ALT 28 04/28/2019   ANIONGAP 8 04/28/2019      Lab Results  Component Value Date   ALT 28 04/28/2019   AST 21 04/28/2019   ALKPHOS 53 04/28/2019   BILITOT 0.5 04/28/2019       Latest Ref Rng & Units 04/28/2019   11:14 AM 05/03/2017    1:04 AM 04/11/2017   11:55 AM  Hepatic Function  Total Protein 6.5 - 8.1 g/dL 7.5  7.4  6.5   Albumin 3.5 - 5.0 g/dL 4.1  4.4  3.7   AST 15 - 41 U/L 21  23  18    ALT 0 - 44 U/L 28  32  20   Alk Phosphatase 38 - 126 U/L 53  62  58   Total Bilirubin 0.3 - 1.2 mg/dL 0.5  0.4  0.4    Lab Results  Component Value Date   CHOL 184 05/03/2017   HDL 47 05/03/2017   LDLCALC 84 05/03/2017   TRIG 267 (H) 05/03/2017   CHOLHDL 3.9 05/03/2017    HEMOGLOBIN A1C Lab Results  Component Value Date   HGBA1C 5.8 (H) 05/03/2017   MPG 119.76 05/03/2017   Lab Results  Component Value Date   TSH 3.742 05/03/2017    Radiology:    Cardiac Studies:   ECHO COMPLETE WITH IMAGING ENHANCING AGENT 03/31/2021 Echocardiogram 03/31/2021 1. Mid systolic cavty obliteration noted with high velocity flow (65m/s). Consider hypertrophic cardiomyopathy with mid cavity obliteration. MRI of the heart should be consider. Left ventricular ejection fraction, by estimation, is >75%. The left ventricle has hyperdynamic function. The left ventricle has no regional wall motion abnormalities. There is moderate left  ventricular hypertrophy. Left ventricular diastolic parameters are consistent with Grade I diastolic dysfunction (impaired relaxation). 2. Right ventricular systolic function is normal. The right ventricular size is normal. 3. The pericardial effusion is circumferential. There is no evidence of cardiac tamponade. 4. The mitral valve is normal in structure. No evidence of mitral valve regurgitation. No evidence of mitral stenosis. 5. The aortic valve  is normal in structure. Aortic valve regurgitation is not visualized. No aortic stenosis is present. 6. The inferior vena cava is normal in size with greater than 50% respiratory variability, suggesting right atrial pressure of 3 mmHg.  Zio Patch Extended out patient EKG monitoring 14 days starting 04/22/2021: Predominant Rhythm : Normal sinus rhythm. Min HR: 54 bpm at 6:55 AM. Max HR 154 bpm at 10 PM Atrial arrhythmias: There were 5 brief atrial tachycardia episodes, longest 7.1 seconds.  Occasional PACs. Atrial fibrillation: None Ventricular arrhythmias: Occasional PVCs, rate related bundle branch block at 102 bpm, symptomatic.  1 reported ventricular tachycardia of 6 beats's is in error, EKG = AT with rate variation.  PVC Burden <1% Heart Block: Rate related bundle branch block, no AV block. Symptoms: Patient triggered event correlated with sinus tachycardia around 105 bpm with associated bundle branch block.  Cardiac MR with morphology with and without contrast 04/19/2021: 1.  Small circumferential pericardial effusion.  2. Normal LV size with moderate LV hypertrophy involving the mid to apical LV segments with signs of a mid-cavity LV gradient. EF 55%. 3.  Normal RV size and systolic function. 4.  Possible subtle mid-wall LGE in the apical lateral wall. 5. T1 findings within normal limits (extracellular volume percentage normal range).   Impression: This study could be consistent with a mid-apical variant hypertrophic cardiomyopathy. There is  not prominent LGE present. Cannot rule out long-standing severe HTN as a cause for the LV changes if this has been present.  Coronary CT angiogram 12/28/2021: 1. No evidence of CAD, CADRADS = 0. 2. Coronary calcium score of 0. This was 0 percentile for age and sex matched control. 3. Normal coronary origin with right dominance. 4.  Normal thoracic aorta.  Borderline enlargement of the pulmonary artery, measuring 29 mm. 5. Significant LVH. 6.  No significant extracardiac abnormality.  Genetic testing for HCM 04/19/2022: Post-test Genetic Consultation notes  Luke Wade is here today for his HCM post-test genetic consult.    Luke Wade notes no major changes to his medical status or that of his family members since we last met.    I informed Luke Wade that he does not have a pathogenic variant for HCM. I explained the reasons as to why his genetic test was negative. Clinical testing is restricted to the coding regions of the gene thus precluding the regulatory control elements that may impact gene function and hence protein activity. I emphasized that though he has a negative test, it is still a genetic condition.    As HCM is an autosomal dominant disorder his children, two daughters , ages 45 and 7 are at a 50% risk of inheriting HCM. It would be appropriate for his first-degree relatives, namely his 51 y.o sister to seek regular cardiology surveillance for HCM by echocardiogram and EKG once every 3-5 years until the age of 60. His daughters can start getting screened before they hit puberty. Guidelines for screening state that kids should be screened annually in their teenage years until the age of 66 and then the frequency can be increased to once every 3-5 years. Wade verbalized understanding of this.   Also informed him that HCM patients that do not have a sarcomeric mutation usually have a good prognosis. He was happy to hear this.   Please note that the patient has not been counseled in this visit on  personal, cultural, or ethical issues that he may face due to his heart condition.      Luke Wade, Ph.D,  [facility] Clinical Molecular Geneticist  EKG:   EKG 07/07/2022: Normal sinus rhythm at the rate of 72 bpm, LAE, marked LVH with repolarization abnormality, consider hypertrophic cardiomyopathy.    Medications and allergies   Allergies  Allergen Reactions   Contrast Media [Iodinated Contrast Media] Hives    Medication list   Current Outpatient Medications:    acetaminophen (TYLENOL) 500 MG tablet, Take 1,000 mg by mouth every 6 (six) hours as needed for moderate pain., Disp: , Rfl:    ALPRAZolam (XANAX) 0.5 MG tablet, Take 0.25 mg by mouth every 4 (four) hours as needed for anxiety., Disp: , Rfl:    cetirizine (ZYRTEC) 10 MG tablet, Take 10 mg by mouth as needed for allergies., Disp: , Rfl:    fluticasone (FLONASE) 50 MCG/ACT nasal spray, Place 2 sprays into both nostrils daily., Disp: , Rfl:    gabapentin (NEURONTIN) 300 MG capsule, Take 300 mg by mouth daily as needed (pain)., Disp: , Rfl:    nitroGLYCERIN (NITROSTAT) 0.4 MG SL tablet, Place 1 tablet (0.4 mg total) under the tongue every 5 (five) minutes as needed for chest pain., Disp: 25 tablet, Rfl: 9   pantoprazole (PROTONIX) 40 MG tablet, Take 40 mg by mouth 2 (two) times daily as needed (acid reflux)., Disp: , Rfl:    spironolactone (ALDACTONE) 50 MG tablet, Take 50 mg by mouth daily., Disp: , Rfl:    verapamil (CALAN-SR) 180 MG CR tablet, Take 1 tablet (180 mg total) by mouth at bedtime., Disp: 30 tablet, Rfl: 2   metoprolol succinate (TOPROL-XL) 100 MG 24 hr tablet, Take 1 tablet (100 mg total) by mouth daily., Disp: 90 tablet, Rfl: 1  Assessment     ICD-10-CM   1. HOCM (hypertrophic obstructive cardiomyopathy) (HCC)  I42.1 EKG 12-Lead    metoprolol succinate (TOPROL-XL) 100 MG 24 hr tablet    verapamil (CALAN-SR) 180 MG CR tablet    2. Hypertensive heart disease without heart failure  I11.9 metoprolol succinate  (TOPROL-XL) 100 MG 24 hr tablet    verapamil (CALAN-SR) 180 MG CR tablet    3. Angina pectoris (HCC)  I20.9     4. Class 2 severe obesity due to excess calories with serious comorbidity and body mass index (BMI) of 38.0 to 38.9 in adult [facility])  E66.01    Z68.38        Orders Placed This Encounter  Procedures   EKG 12-Lead    Meds ordered this encounter  Medications   metoprolol succinate (TOPROL-XL) 100 MG 24 hr tablet    Sig: Take 1 tablet (100 mg total) by mouth daily.    Dispense:  90 tablet    Refill:  1   verapamil (CALAN-SR) 180 MG CR tablet    Sig: Take 1 tablet (180 mg total) by mouth at bedtime.    Dispense:  30 tablet    Refill:  2    Discontinue indapamide    Medications Discontinued During This Encounter  Medication Reason   benzonatate (TESSALON) 100 MG capsule    indapamide (LOZOL) 1.25 MG tablet Change in therapy   metoprolol succinate (TOPROL-XL) 50 MG 24 hr tablet Reorder     Recommendations:   Luke Wade is a 42 y.o. African-American male patient who has been diagnosed with HOCM, moderate obesity, primary hypertension, who was being followed by [facility] heart care wanted to make a switch and establish with me as his wife also sees me.  1. HOCM (hypertrophic obstructive cardiomyopathy) (HCC)  I have personally reviewed the images of the echocardiogram, upon review, the etiology appears to be more consistent with hypertensive heart disease than HOCM.  Also MRI did not suggest classic HOCM pattern.  Genetic testing was negative for any suggestion of congenital HOCM gene positivity as well.  Patient has had uncontrolled hypertension and untreated hypertension at his younger years and I suspect he has developed moderate concentric LVH.  Also the cavity obliteration and intraventricular pressure gradient is mid cavitary than LVOT obstruction, no Luke Wade either.  - EKG 12-Lead - metoprolol succinate (TOPROL-XL) 100 MG 24 hr tablet; Take 1 tablet (100 mg total) by  mouth daily.  Dispense: 90 tablet; Refill: 1 - verapamil (CALAN-SR) 180 MG CR tablet; Take 1 tablet (180 mg total) by mouth at bedtime.  Dispense: 30 tablet; Refill: 2  2. Hypertensive heart disease without heart failure His anginal symptoms is related to hypertensive heart disease and subendocardial ischemia.  Inducing bradycardia will certainly help with improving his coronary flow.  I will discontinue indapamide in view of hyperdynamic LV, try to control his hypertension with 2 negative inotropic agents, increasing the dose of metoprolol succinate to 100 mg daily from 50 and adding verapamil 180 mg daily.  Continue with spironolactone.  - metoprolol succinate (TOPROL-XL) 100 MG 24 hr tablet; Take 1 tablet (100 mg total) by mouth daily.  Dispense: 90 tablet; Refill: 1 - verapamil (CALAN-SR) 180 MG CR tablet; Take 1 tablet (180 mg total) by mouth at bedtime.  Dispense: 30 tablet; Refill: 2  3. Angina pectoris (HCC) As dictated above his anginal symptoms are related to subendocardial ischemia.  He has had a coronary CTA, which did not reveal any significant abnormality.  4. Class 2 severe obesity due to excess calories with serious comorbidity and body mass index (BMI) of 38.0 to 38.9 in adult [facility]) I have discussed extensively with the patient regarding making lifestyle changes and the importance of reducing the weight.  I will see him back in 6 weeks for follow-up.  Other orders - fluticasone (FLONASE) 50 MCG/ACT nasal spray; Place 2 sprays into both nostrils daily. - cetirizine (ZYRTEC) 10 MG tablet; Take 10 mg by mouth as needed for allergies.  This was a 60-minute consult/office visit, personal review of images, making complex edition.    Yates Decamp, MD, [facility] 07/08/2022, 5:23 PM Office: [phone]

## 2022-08-14 ENCOUNTER — Other Ambulatory Visit: Payer: Self-pay | Admitting: Internal Medicine

## 2022-08-14 DIAGNOSIS — I10 Essential (primary) hypertension: Secondary | ICD-10-CM

## 2022-08-17 ENCOUNTER — Ambulatory Visit: Payer: BC Managed Care – PPO | Admitting: Cardiology

## 2022-08-24 ENCOUNTER — Other Ambulatory Visit (HOSPITAL_BASED_OUTPATIENT_CLINIC_OR_DEPARTMENT_OTHER): Payer: Self-pay

## 2022-08-24 MED ORDER — WEGOVY 1 MG/0.5ML ~~LOC~~ SOAJ: 1.0000 mg | SUBCUTANEOUS | 0 refills | Status: DC

## 2022-08-24 MED ORDER — WEGOVY 0.5 MG/0.5ML ~~LOC~~ SOAJ
0.5000 mg | SUBCUTANEOUS | 0 refills | Status: DC
  Filled 2022-08-24: qty 2, 28d supply, fill #0

## 2022-08-31 ENCOUNTER — Ambulatory Visit: Payer: BC Managed Care – PPO | Admitting: Cardiology

## 2022-08-31 ENCOUNTER — Encounter: Payer: Self-pay | Admitting: Cardiology

## 2022-08-31 VITALS — BP 105/48 | HR 57 | Resp 17 | Ht 65.0 in | Wt 237.0 lb

## 2022-08-31 DIAGNOSIS — I119 Hypertensive heart disease without heart failure: Secondary | ICD-10-CM

## 2022-08-31 DIAGNOSIS — I10 Essential (primary) hypertension: Secondary | ICD-10-CM

## 2022-08-31 DIAGNOSIS — I209 Angina pectoris, unspecified: Secondary | ICD-10-CM

## 2022-08-31 MED ORDER — SPIRONOLACTONE 50 MG PO TABS
50.0000 mg | ORAL_TABLET | ORAL | 3 refills | Status: DC
Start: 2022-08-31 — End: 2023-10-30

## 2022-08-31 MED ORDER — METOPROLOL SUCCINATE ER 100 MG PO TB24: 100.0000 mg | ORAL_TABLET | Freq: Every day | ORAL | 3 refills | Status: DC

## 2022-08-31 MED ORDER — VERAPAMIL HCL ER 180 MG PO TBCR
180.0000 mg | EXTENDED_RELEASE_TABLET | Freq: Every day | ORAL | 3 refills | Status: DC
Start: 2022-08-31 — End: 2023-10-10

## 2022-08-31 NOTE — Progress Notes (Signed)
Primary Physician/Referring:  Soundra Pilon, FNP  Patient ID: Luke Wade, male    DOB: 12-01-1980, 42 y.o.   MRN: [id number]  Chief Complaint  Patient presents with   Chest Pain   Hypertension   HPI:    Luke Wade  is a 42 y.o. African-American male patient with moderate obesity, primary hypertension, hypertensive heart disease without heart failure, hypertrophic cardiomyopathy excluded presents for 10-month follow-up.  Patient states that since making medication changes with increasing metoprolol dose and adding verapamil, he has not had any further chest pain.  He also completely quit smoking since his prior office visit with me 2 months ago.  He is also trying to make lifestyle changes with regard to his diet.  States that he is feeling the best he has in quite a while. Denies dyspnea, PND or orthopnea.  Past Medical History:  Diagnosis Date   Abnormal myocardial perfusion study 05/02/2017   Displacement of lumbar intervertebral disc without myelopathy 03/24/2021   Essential hypertension 04/16/2017   Gastroesophageal reflux disease without esophagitis 08/05/2018   Generalized anxiety disorder 03/24/2021   Mild recurrent major depression (HCC) 03/24/2021   Morbid obesity (HCC) 03/24/2021   NSVT (nonsustained ventricular tachycardia) (HCC) 05/18/2021   Obesity (BMI 35.0-39.9 without comorbidity) 10/05/2021   Recurrent major depression in remission (HCC) 03/24/2021   Renal artery stenosis (HCC) 03/24/2021   Vasculitis (HCC) 03/24/2021   Past Surgical History:  Procedure Laterality Date   COLONOSCOPY WITH PROPOFOL N/A 06/09/2022   Procedure: COLONOSCOPY WITH PROPOFOL;  Surgeon: Jeani Hawking, MD;  Location: [facility];  Service: Gastroenterology;  Laterality: N/A;   ESOPHAGOGASTRODUODENOSCOPY (EGD) WITH PROPOFOL N/A 06/09/2022   Procedure: ESOPHAGOGASTRODUODENOSCOPY (EGD) WITH PROPOFOL;  Surgeon: Jeani Hawking, MD;  Location: [facility];  Service: Gastroenterology;   Laterality: N/A;   LEFT HEART CATH AND CORONARY ANGIOGRAPHY N/A 05/03/2017   Procedure: LEFT HEART CATH AND CORONARY ANGIOGRAPHY;  Surgeon: Marykay Lex, MD;  Location: [facility] INVASIVE CV LAB;  Service: Cardiovascular;  Laterality: N/A;   NO PAST SURGERIES     Family History  Problem Relation Age of Onset   Cancer Mother    Cancer Father    Hypertension Maternal Grandmother    Hypertension Maternal Grandfather    Hypertension Paternal Grandmother    Diabetes Paternal Grandmother    Diabetes Paternal Grandfather    Hypertension Paternal Grandfather     Social History   Tobacco Use   Smoking status: Former    Current packs/day: 0.25    Average packs/day: 0.3 packs/day for 2.6 years (0.6 ttl pk-yrs)    Types: Cigarettes    Start date: 2022   Smokeless tobacco: Never   Tobacco comments:    2022  Substance Use Topics   Alcohol use: Yes    Comment: occ   Marital Status: Married  ROS  Review of Systems  Cardiovascular:  Negative for chest pain, dyspnea on exertion and leg swelling.   Objective      08/31/2022   10:15 AM 07/07/2022    9:53 AM 06/09/2022    9:40 AM  Vitals with BMI  Height 5\' 5"  5\' 5"    Weight 237 lbs 230 lbs   BMI 39.44 38.27   Systolic 105 145 034  Diastolic 48 89 101  Pulse 57 78 80   Blood pressure (!) 105/48, pulse (!) 57, resp. rate 17, height 5\' 5"  (1.651 m), weight 237 lb (107.5 kg), SpO2 98%.   Physical Exam Constitutional:  Appearance: He is obese.  Neck:     Vascular: No JVD.  Cardiovascular:     Rate and Rhythm: Normal rate and regular rhythm.     Pulses: Intact distal pulses.     Heart sounds: S1 normal and S2 normal. Murmur heard.     Midsystolic murmur is present with a grade of 2/6 at the upper right sternal border radiating to the neck. No significant change with Valsalva     No gallop.  Pulmonary:     Effort: Pulmonary effort is normal.     Breath sounds: Normal breath sounds.  Abdominal:     General: Bowel sounds are  normal.     Palpations: Abdomen is soft.  Musculoskeletal:     Right lower leg: No edema.     Left lower leg: No edema.     Laboratory examination:   Lab Results  Component Value Date   NA 137 09/21/2021   K 3.8 09/21/2021   CO2 25 09/21/2021   GLUCOSE 80 09/21/2021   BUN 19 09/21/2021   CREATININE 1.09 09/21/2021   CALCIUM 10.0 09/21/2021   EGFR 87 09/21/2021   GFRNONAA >60 04/28/2019     External labs:  Cholesterol, total 222.000 m 06/14/2022 HDL 60.000 mg 06/14/2022 LDL 127.000 m 06/14/2022 Triglycerides 209.000 m 06/14/2022  A1C 6.100 % of 06/14/2022 TSH 2.190 09/15/2021  Hemoglobin 14.900 g/d 06/14/2022 Platelets 347.000 Th 06/14/2022  Creatinine, Serum 1.050 mg/ 06/14/2022 Potassium 4.100 mm 06/14/2022 ALT (SGPT) 27.000 U/L 06/14/2022  Radiology:    Cardiac Studies:   ECHO COMPLETE WITH IMAGING ENHANCING AGENT 03/31/2021 Echocardiogram 03/31/2021 1. Mid systolic cavty obliteration noted with high velocity flow (13m/s). Consider hypertrophic cardiomyopathy with mid cavity obliteration. MRI of the heart should be consider. Left ventricular ejection fraction, by estimation, is >75%. The left ventricle has hyperdynamic function. The left ventricle has no regional wall motion abnormalities. There is moderate left ventricular hypertrophy. Left ventricular diastolic parameters are consistent with Grade I diastolic dysfunction (impaired relaxation). 2. Right ventricular systolic function is normal. The right ventricular size is normal. 3. The pericardial effusion is circumferential. There is no evidence of cardiac tamponade. 4. The mitral valve is normal in structure. No evidence of mitral valve regurgitation. No evidence of mitral stenosis. 5. The aortic valve is normal in structure. Aortic valve regurgitation is not visualized. No aortic stenosis is present. 6. The inferior vena cava is normal in size with greater than 50% respiratory variability, suggesting right atrial pressure of 3  mmHg.  Zio Patch Extended out patient EKG monitoring 14 days starting 04/22/2021: Predominant Rhythm : Normal sinus rhythm. Min HR: 54 bpm at 6:55 AM. Max HR 154 bpm at 10 PM Atrial arrhythmias: There were 5 brief atrial tachycardia episodes, longest 7.1 seconds.  Occasional PACs. Atrial fibrillation: None Ventricular arrhythmias: Occasional PVCs, rate related bundle branch block at 102 bpm, symptomatic.  1 reported ventricular tachycardia of 6 beats's is in error, EKG = AT with rate variation.  PVC Burden <1% Heart Block: Rate related bundle branch block, no AV block. Symptoms: Patient triggered event correlated with sinus tachycardia around 105 bpm with associated bundle branch block.  Cardiac MR with morphology with and without contrast 04/19/2021: 1.  Small circumferential pericardial effusion.  2. Normal LV size with moderate LV hypertrophy involving the mid to apical LV segments with signs of a mid-cavity LV gradient. EF 55%. 3.  Normal RV size and systolic function. 4.  Possible subtle mid-wall LGE in the apical lateral wall.  5. T1 findings within normal limits (extracellular volume percentage normal range).   Impression: This study could be consistent with a mid-apical variant hypertrophic cardiomyopathy. There is not prominent LGE present. Cannot rule out long-standing severe HTN as a cause for the LV changes if this has been present.  Coronary CT angiogram 12/28/2021: 1. No evidence of CAD, CADRADS = 0. 2. Coronary calcium score of 0. This was 0 percentile for age and sex matched control. 3. Normal coronary origin with right dominance. 4.  Normal thoracic aorta.  Borderline enlargement of the pulmonary artery, measuring 29 mm. 5. Significant LVH. 6.  No significant extracardiac abnormality.  Genetic testing for HCM 04/19/2022: Post-test Genetic Consultation Wade  Luke Wade is here today for his HCM post-test genetic consult.    Luke Wade no major changes to his medical  status or that of his family members since we last met.    I informed Luke Wade that he does not have a pathogenic variant for HCM. I explained the reasons as to why his genetic test was negative. Clinical testing is restricted to the coding regions of the gene thus precluding the regulatory control elements that may impact gene function and hence protein activity. I emphasized that though he has a negative test, it is still a genetic condition.    As HCM is an autosomal dominant disorder his children, two daughters , ages 49 and 7 are at a 50% risk of inheriting HCM. It would be appropriate for his first-degree relatives, namely his 76 y.o sister to seek regular cardiology surveillance for HCM by echocardiogram and EKG once every 3-5 years until the age of 51. His daughters can start getting screened before they hit puberty. Guidelines for screening state that kids should be screened annually in their teenage years until the age of 28 and then the frequency can be increased to once every 3-5 years. Hhe verbalized understanding of this.   Also informed him that HCM patients that do not have a sarcomeric mutation usually have a good prognosis. He was happy to hear this.   Please note that the patient has not been counseled in this visit on personal, cultural, or ethical issues that he may face due to his heart condition.      Luke Wade, Ph.D, [facility] Clinical Molecular Geneticist  EKG:   EKG 07/07/2022: Normal sinus rhythm at the rate of 72 bpm, LAE, marked LVH with repolarization abnormality, consider hypertrophic cardiomyopathy.    Medications and allergies   Allergies  Allergen Reactions   Contrast Media [Iodinated Contrast Media] Hives    Medication list   Current Outpatient Medications:    ALPRAZolam (XANAX) 0.5 MG tablet, Take 0.25 mg by mouth every 4 (four) hours as needed for anxiety., Disp: , Rfl:    gabapentin (NEURONTIN) 300 MG capsule, Take 300 mg by mouth daily as needed (pain).,  Disp: , Rfl:    nitroGLYCERIN (NITROSTAT) 0.4 MG SL tablet, Place 1 tablet (0.4 mg total) under the tongue every 5 (five) minutes as needed for chest pain., Disp: 25 tablet, Rfl: 9   metoprolol succinate (TOPROL-XL) 100 MG 24 hr tablet, Take 1 tablet (100 mg total) by mouth daily., Disp: 90 tablet, Rfl: 3   spironolactone (ALDACTONE) 50 MG tablet, Take 1 tablet (50 mg total) by mouth every morning., Disp: 90 tablet, Rfl: 3   verapamil (CALAN-SR) 180 MG CR tablet, Take 1 tablet (180 mg total) by mouth at bedtime., Disp: 90 tablet, Rfl: 3  Assessment  ICD-10-CM   1. Hypertensive heart disease without heart failure  I11.9 verapamil (CALAN-SR) 180 MG CR tablet    metoprolol succinate (TOPROL-XL) 100 MG 24 hr tablet    2. Angina pectoris (HCC)  I20.9     3. Class 2 severe obesity due to excess calories with serious comorbidity and body mass index (BMI) of 38.0 to 38.9 in adult (HCC)  E66.01    Z68.38     4. Primary hypertension  I10 spironolactone (ALDACTONE) 50 MG tablet       No orders of the defined types were placed in this encounter.   Meds ordered this encounter  Medications   verapamil (CALAN-SR) 180 MG CR tablet    Sig: Take 1 tablet (180 mg total) by mouth at bedtime.    Dispense:  90 tablet    Refill:  3    Discontinue indapamide   spironolactone (ALDACTONE) 50 MG tablet    Sig: Take 1 tablet (50 mg total) by mouth every morning.    Dispense:  90 tablet    Refill:  3   metoprolol succinate (TOPROL-XL) 100 MG 24 hr tablet    Sig: Take 1 tablet (100 mg total) by mouth daily.    Dispense:  90 tablet    Refill:  3    Medications Discontinued During This Encounter  Medication Reason   pantoprazole (PROTONIX) 40 MG tablet    Semaglutide-Weight Management (WEGOVY) 0.5 MG/0.5ML SOAJ    Semaglutide-Weight Management (WEGOVY) 1 MG/0.5ML SOAJ Cost of medication   fluticasone (FLONASE) 50 MCG/ACT nasal spray    cetirizine (ZYRTEC) 10 MG tablet    acetaminophen (TYLENOL) 500  MG tablet    Semaglutide-Weight Management (WEGOVY) 1 MG/0.5ML SOAJ Cost of medication   metoprolol succinate (TOPROL-XL) 100 MG 24 hr tablet Reorder   verapamil (CALAN-SR) 180 MG CR tablet Reorder   spironolactone (ALDACTONE) 50 MG tablet Reorder     Recommendations:   Luke Wade is a 42 y.o. African-American male patient with moderate obesity, primary hypertension, hypertensive heart disease without heart failure, hypertrophic cardiomyopathy excluded presents for 51-month follow-up.  1. Hypertensive heart disease without heart failure Since he made changes to his medications and added verapamil to his present medical regimen to induce bradycardia, added spironolactone, his blood pressure is now under excellent control.  Patient states that his symptoms of fatigue and dyspnea has improved significantly and is very thankful.  I have refilled the prescription.  - verapamil (CALAN-SR) 180 MG CR tablet; Take 1 tablet (180 mg total) by mouth at bedtime.  Dispense: 90 tablet; Refill: 3 - metoprolol succinate (TOPROL-XL) 100 MG 24 hr tablet; Take 1 tablet (100 mg total) by mouth daily.  Dispense: 90 tablet; Refill: 3  2. Angina pectoris (HCC) Since addition of beta-blocker therapy along with calcium channel blocker therapy and control of hypertension, he has had no further episodes of exertional chest pain which I suspect is related to subendocardial ischemia from severe LVH.  He has no significant coronary artery disease by CT angiogram of the coronaries.  3. Class 2 severe obesity due to excess calories with serious comorbidity and body mass index (BMI) of 38.0 to 38.9 in adult [facility]) I have discussed with him regarding modifying his weight and avoiding excess calories patient states that he has gained some weight since last office visit with me 2 months ago.  I have congratulated him.  Encouraged him to continue to be abstinent from smoking cigarettes.  4. Primary hypertension Blood pressure is  not well-controlled.  Continue present medical therapy.  I will see him back in 6 months for follow-up. - spironolactone (ALDACTONE) 50 MG tablet; Take 1 tablet (50 mg total) by mouth every morning.  Dispense: 90 tablet; Refill: 3   Luke Decamp, MD, [facility] 08/31/2022, 11:21 AM Office: [phone]

## 2023-03-05 ENCOUNTER — Ambulatory Visit: Payer: Self-pay | Admitting: Cardiology

## 2023-05-15 ENCOUNTER — Encounter: Payer: Self-pay | Admitting: Cardiology

## 2023-05-15 ENCOUNTER — Ambulatory Visit: Payer: BC Managed Care – PPO | Attending: Cardiology | Admitting: Cardiology

## 2023-05-15 VITALS — BP 130/72 | HR 83 | Resp 16 | Ht 65.0 in | Wt 245.0 lb

## 2023-05-15 DIAGNOSIS — E66813 Obesity, class 3: Secondary | ICD-10-CM | POA: Diagnosis not present

## 2023-05-15 DIAGNOSIS — I119 Hypertensive heart disease without heart failure: Secondary | ICD-10-CM | POA: Diagnosis not present

## 2023-05-15 DIAGNOSIS — I10 Essential (primary) hypertension: Secondary | ICD-10-CM | POA: Diagnosis not present

## 2023-05-15 DIAGNOSIS — E7800 Pure hypercholesterolemia, unspecified: Secondary | ICD-10-CM

## 2023-05-15 DIAGNOSIS — Z6841 Body Mass Index (BMI) 40.0 and over, adult: Secondary | ICD-10-CM

## 2023-05-15 MED ORDER — SEMAGLUTIDE-WEIGHT MANAGEMENT 0.25 MG/0.5ML ~~LOC~~ SOAJ
0.2500 mg | SUBCUTANEOUS | 2 refills | Status: AC
Start: 2023-05-15 — End: ?

## 2023-05-15 NOTE — Progress Notes (Signed)
 Cardiology Office Note:  .   Date:  05/15/2023  ID:  Luke Wade, DOB [birth year], MRN [id number] PCP: Jeri Lager, FNP  [facility] Providers Cardiologist:  Yates Decamp, MD   History of Present Illness: .   Luke Wade is a 43 y.o. African-American male patient with moderate obesity, primary hypertension, hypertensive heart disease without heart failure, hypertrophic cardiomyopathy excluded by cardiac MRI on 04/19/2021 revealing small circumferential pericardial effusion, and moderate LV hypertrophy in the mid-apical LV segments suggestive of questionable apical variant hypertrophic cardiomyopathy and more indicative of longstanding hypertensive heart disease.  Genetic testing was also negative genetically acquired HCM.  He has also had a normal coronary CT angiogram with coronary calcium score of 0 on 12/28/2021.  Discussed the use of AI scribe software for clinical note transcription with the patient, who gave verbal consent to proceed.  He reports that he has been doing well overall, with marked improvement in dyspnea and blood pressure control since starting on a combination of verapamil SR and metoprolol succinate. He has also remained abstinent from tobacco use.  However, he has not been using his CPAP machine regularly due to discomfort and a previous experience of getting a cold from its use. He expresses intent to start using it again and has ordered new supplies. He also reports congestion, which he attributes to pollen exposure.  The patient has been trying to lose weight and has been considering weight loss medications. However, he expresses concern about the cost of these medications  Labs   Lab Results  Component Value Date   CHOL 184 05/03/2017   HDL 47 05/03/2017   LDLCALC 84 05/03/2017   TRIG 267 (H) 05/03/2017   CHOLHDL 3.9 05/03/2017   Lab Results  Component Value Date   NA 137 09/21/2021   K 3.8 09/21/2021   CO2 25 09/21/2021   GLUCOSE 80  09/21/2021   BUN 19 09/21/2021   CREATININE 1.09 09/21/2021   CALCIUM 10.0 09/21/2021   EGFR 87 09/21/2021   GFRNONAA >60 04/28/2019      Latest Ref Rng & Units 09/21/2021   12:00 AM 06/13/2021    3:03 PM 04/28/2019   11:14 AM  BMP  Glucose 70 - 99 mg/dL 80  81  99   BUN 6 - 24 mg/dL 19  19  17    Creatinine 0.76 - 1.27 mg/dL 4.09  8.11  9.14   BUN/Creat Ratio 9 - 20 17  15     Sodium 134 - 144 mmol/L 137  137  136   Potassium 3.5 - 5.2 mmol/L 3.8  4.4  4.6   Chloride 96 - 106 mmol/L 98  97  106   CO2 20 - 29 mmol/L 25  24  22    Calcium 8.7 - 10.2 mg/dL 78.2  95.6  9.0       Latest Ref Rng & Units 04/18/2021    3:29 PM 04/28/2019   11:14 AM 07/05/2017   10:45 PM  CBC  WBC 3.4 - 10.8 x10E3/uL 8.3  7.3  8.6   Hemoglobin 13.0 - 17.7 g/dL 21.3  08.6  57.8   Hematocrit 37.5 - 51.0 % 43.4  49.5  42.3   Platelets 150 - 450 x10E3/uL 292  288  250    Lab Results  Component Value Date   HGBA1C 5.8 (H) 05/03/2017    Lab Results  Component Value Date   TSH 3.742 05/03/2017    External labs:  PCP [facility] labs  02/13/2023:  Total cholesterol 235, triglycerides 100, HDL 66, LDL 148.  Review of Systems  Cardiovascular:  Negative for chest pain and leg swelling.  Respiratory:  Positive for shortness of breath (seasonal allergies) and wheezing (seasonal allergies).    Physical Exam:   VS:  BP 130/72 (BP Location: Left Arm, Patient Position: Sitting, Cuff Size: Large)   Pulse 83   Resp 16   Ht 5\' 5"  (1.651 m)   Wt 245 lb (111.1 kg)   SpO2 97%   BMI 40.77 kg/m    Wt Readings from Last 3 Encounters:  05/15/23 245 lb (111.1 kg)  08/31/22 237 lb (107.5 kg)  07/07/22 230 lb (104.3 kg)    Physical Exam Neck:     Vascular: No JVD.  Cardiovascular:     Rate and Rhythm: Normal rate and regular rhythm.     Pulses: Intact distal pulses.     Heart sounds: S1 normal and S2 normal. Murmur heard.     Early systolic murmur is present with a grade of 2/6 at the upper right sternal border.      No gallop.  Pulmonary:     Effort: Pulmonary effort is normal.     Breath sounds: Normal breath sounds.  Abdominal:     General: Bowel sounds are normal.     Palpations: Abdomen is soft.  Musculoskeletal:     Right lower leg: No edema.     Left lower leg: No edema.    Studies Reviewed: Marland Kitchen     EKG:    EKG Interpretation Date/Time:  Tuesday May 15 2023 15:16:17 EDT Ventricular Rate:  78 PR Interval:  160 QRS Duration:  96 QT Interval:  378 QTC Calculation: 430 R Axis:   42  Text Interpretation: EKG 05/15/2023: Normal sinus rhythm at the rate of 78 bpm, borderline left atrial enlargement, LVH with repolarization, cannot exclude inferior and lateral ischemia.  Compared to 07/05/2017, heart rate has reduced from 92 bpm and LV voltage has decreased. Confirmed by Delrae Rend [id number]) on 05/15/2023 3:22:25 PM    EKG 07/07/2022: Normal sinus rhythm at the rate of 72 bpm, LAE, marked LVH with repolarization abnormality, consider hypertrophic cardiomyopathy.   Medications and allergies    Allergies  Allergen Reactions   Contrast Media [Iodinated Contrast Media] Hives     Current Outpatient Medications:    metoprolol succinate (TOPROL-XL) 100 MG 24 hr tablet, Take 1 tablet (100 mg total) by mouth daily., Disp: 90 tablet, Rfl: 3   Semaglutide-Weight Management 0.25 MG/0.5ML SOAJ, Inject 0.25 mg into the skin once a week., Disp: 2 mL, Rfl: 2   spironolactone (ALDACTONE) 50 MG tablet, Take 1 tablet (50 mg total) by mouth every morning., Disp: 90 tablet, Rfl: 3   verapamil (CALAN-SR) 180 MG CR tablet, Take 1 tablet (180 mg total) by mouth at bedtime., Disp: 90 tablet, Rfl: 3   ALPRAZolam (XANAX) 0.5 MG tablet, Take 0.25 mg by mouth every 4 (four) hours as needed for anxiety. (Patient not taking: Reported on 05/15/2023), Disp: , Rfl:    gabapentin (NEURONTIN) 300 MG capsule, Take 300 mg by mouth daily as needed (pain). (Patient not taking: Reported on 05/15/2023), Disp: , Rfl:    nitroGLYCERIN  (NITROSTAT) 0.4 MG SL tablet, Place 1 tablet (0.4 mg total) under the tongue every 5 (five) minutes as needed for chest pain. (Patient not taking: Reported on 05/15/2023), Disp: 25 tablet, Rfl: 9   Meds ordered this encounter  Medications   Semaglutide-Weight Management 0.25  MG/0.5ML SOAJ    Sig: Inject 0.25 mg into the skin once a week.    Dispense:  2 mL    Refill:  2     There are no discontinued medications.   ASSESSMENT AND PLAN: .      ICD-10-CM   1. Hypertensive heart disease without heart failure  I11.9 EKG 12-Lead    EKG 12-Lead    Semaglutide-Weight Management 0.25 MG/0.5ML SOAJ    2. Primary hypertension  I10     3. Class 3 severe obesity due to excess calories with serious comorbidity and body mass index (BMI) of 40.0 to 44.9 in adult [facility])  Z61.096 Semaglutide-Weight Management 0.25 MG/0.5ML SOAJ   E66.01    Z68.41     4. Hypercholesteremia  E78.00       1. Hypertensive heart disease without heart failure (Primary) Patient with hypertension with hypertensive heart disease, since combination of verapamil and metoprolol succinate, he has noticed marked improvement in dyspnea, blood pressure control.  No clinical evidence of heart failure.  He has also remained abstinent from tobacco use.  - EKG 12-Lead  - Semaglutide-Weight Management 0.25 MG/0.5ML SOAJ; Inject 0.25 mg into the skin once a week.  Dispense: 2 mL; Refill: 2  2. Primary hypertension Blood pressure is well-controlled on present medical regimen including metoprolol and verapamil and also spironolactone.  Since control of hypertension, his EKG has also improved with regard to the voltage criteria for LVH.  No change in his physical exam.  Prominent systolic ejection murmur is now very soft.  3. Class 3 severe obesity due to excess calories with serious comorbidity and body mass index (BMI) of 40.0 to 44.9 in adult [facility]) Patient has morbid obesity, also has sleep apnea but has not been using CPAP regularly.   Encouraged him to start using CPAP.  I would like to try and see if his insurance would approve Wegovy, Rx was sent.  - Semaglutide-Weight Management 0.25 MG/0.5ML SOAJ; Inject 0.25 mg into the skin once a week.  Dispense: 2 mL; Refill: 2  4. Hypercholesteremia I reviewed his external labs, cholesterol is increased probably related to weight gain and poor dietary habits.  I discussed starting him on therapy for primary prevention, patient is skeptical of starting any new medications for now and would like to try see if he can lose weight and exercise regularly as well.  He is being now closely followed by Ms. Derenda Mis, NP, she could certainly recheck lipids in 6 months and see if he needs to be on therapy.  Fortunately his coronary calcium score was 0 and he has no atherosclerotic burden that is known by [place] for now.   I would have a low threshold to start therapy.  Hopefully insurance will approve Wegovy.  I will see him back in a year or sooner if problems.  Signed,  Yates Decamp, MD, [facility] 05/15/2023, 3:39 PM [facility] ([facility] [address] [place], [place] [address] Phone: [phone]. Fax:  [phone]

## 2023-05-15 NOTE — Patient Instructions (Addendum)
 Medication Instructions:  Start Wegovy 0.25 mg.  Inject into skin once per week   *If you need a refill on your cardiac medications before your next appointment, please call your pharmacy*  Lab Work: none If you have labs (blood work) drawn today and your tests are completely normal, you will receive your results only by: MyChart Message (if you have MyChart) OR A paper copy in the mail If you have any lab test that is abnormal or we need to change your treatment, we will call you to review the results.  Testing/Procedures: none  Follow-Up: At [facility], you and your health needs are our priority.  As part of our continuing mission to provide you with exceptional heart care, our providers are all part of one team.  This team includes your primary Cardiologist (physician) and Advanced Practice Providers or APPs (Physician Assistants and Nurse Practitioners) who all work together to provide you with the care you need, when you need it.  Your next appointment:   12 month(s)  Provider:   Yates Decamp, MD     We recommend signing up for the patient portal called "MyChart".  Sign up information is provided on this After Visit Summary.  MyChart is used to connect with patients for Virtual Visits (Telemedicine).  Patients are able to view lab/test results, encounter notes, upcoming appointments, etc.  Non-urgent messages can be sent to your provider as well.   To learn more about what you can do with MyChart, go to [email or web address].   Other Instructions        1st Floor: - Lobby - Registration  - Pharmacy  - Lab - Cafe  2nd Floor: - PV Lab - Diagnostic Testing (echo, CT, nuclear med)  3rd Floor: - Vacant  4th Floor: - TCTS (cardiothoracic surgery) - AFib Clinic - Structural Heart Clinic - Vascular Surgery  - Vascular Ultrasound  5th Floor: - HeartCare Cardiology (general and EP) - Clinical Pharmacy for coumadin, hypertension, lipid, weight-loss  medications, and med management appointments    Valet parking services will be available as well.

## 2023-06-25 ENCOUNTER — Encounter (HOSPITAL_COMMUNITY): Payer: Self-pay

## 2023-06-25 ENCOUNTER — Other Ambulatory Visit: Payer: Self-pay

## 2023-06-25 ENCOUNTER — Emergency Department (HOSPITAL_COMMUNITY)
Admission: EM | Admit: 2023-06-25 | Discharge: 2023-06-25 | Disposition: A | Discharge status: Home / Self Care | Attending: Emergency Medicine | Admitting: Emergency Medicine

## 2023-06-25 DIAGNOSIS — M5442 Lumbago with sciatica, left side: Secondary | ICD-10-CM | POA: Diagnosis not present

## 2023-06-25 DIAGNOSIS — M545 Low back pain, unspecified: Secondary | ICD-10-CM | POA: Diagnosis present

## 2023-06-25 DIAGNOSIS — M5432 Sciatica, left side: Secondary | ICD-10-CM

## 2023-06-25 MED ORDER — GABAPENTIN 300 MG PO CAPS: 300.0000 mg | ORAL_CAPSULE | Freq: Three times a day (TID) | ORAL | 0 refills | Status: AC

## 2023-06-25 NOTE — Discharge Instructions (Addendum)
 Increase the Neurontin  from twice a day up to 3 times a day.  Follow-up with neurosurgery and they can hopefully help with the pain.

## 2023-06-25 NOTE — ED Triage Notes (Signed)
 Pt arrives via POV. Pt reports left lower back pain that radiates down his left leg for the past 3 weeks. Reports hx of sciatica. Denies injury. Reports heaving lifting at work. Pt AxOx4.

## 2023-06-25 NOTE — ED Provider Notes (Signed)
 [name] EM[facility] DEPARTMENT AT [facility] Provider Note   CSN: [id number] Arrival date & time: 06/25/23  0857     History  Chief Complaint  Patient presents with   Back Pain    Luke Wade is a 43 y.o. male.   Back Pain Patient presents with back pain.  Has had for the last couple months.  Has been on steroids by PCP.  No new injury.  Has seen neurosurgery in the past but states that batteries are not assigned to him.  Has a numbness or burning in his left lower back.  Goes down the leg.  No fevers.  No loss of bladder or bowel control..     Home Medications Prior to Admission medications   Medication Sig Start Date End Date Taking? Authorizing Provider  ALPRAZolam (XANAX) 0.5 MG tablet Take 0.25 mg by mouth every 4 (four) hours as needed for anxiety. Patient not taking: Reported on 05/15/2023 01/03/22   [provider]  gabapentin  (NEURONTIN ) 300 MG capsule Take 1 capsule (300 mg total) by mouth 3 (three) times daily. 06/25/23   [name], MD  metoprolol  succinate (TOPROL -XL) 100 MG 24 hr tablet Take 1 tablet (100 mg total) by mouth daily. 08/31/22   Knox Perl, MD  nitroGLYCERIN  (NITROSTAT ) 0.4 MG SL tablet Place 1 tablet (0.4 mg total) under the tongue every 5 (five) minutes as needed for chest pain. Patient not taking: Reported on 05/15/2023 04/18/22   Revankar, Micael Adas, MD  Semaglutide -Weight Management 0.25 MG/0.5ML SOAJ Inject 0.25 mg into the skin once a week. 05/15/23   Knox Perl, MD  spironolactone  (ALDACTONE ) 50 MG tablet Take 1 tablet (50 mg total) by mouth every morning. 08/31/22   Knox Perl, MD  verapamil  (CALAN -SR) 180 MG CR tablet Take 1 tablet (180 mg total) by mouth at bedtime. 08/31/22   Knox Perl, MD      Allergies    Contrast media [iodinated contrast media]    Review of Systems   Review of Systems  Musculoskeletal:  Positive for back pain.    Physical Exam Updated Vital Signs BP (!) 155/84   Pulse 77   Temp 98.6 F (37  C)   Resp 16   Ht 5\' 5"  (1.651 m)   Wt 108.9 kg   SpO2 95%   BMI 39.94 kg/m  Physical Exam Vitals and nursing note reviewed.  Abdominal:     Tenderness: There is no abdominal tenderness.  Musculoskeletal:     Comments: Mild tenderness in the left lower back.  No rash.  Good straight leg raise.  Sensation grossly intact in left lower extremity.  Neurological:     Mental Status: He is alert.     ED Results / Procedures / Treatments   Labs (all labs ordered are listed, but only abnormal results are displayed) Labs Reviewed - No data to display  EKG None  Radiology No results found.  Procedures Procedures    Medications Ordered in ED Medications - No data to display  ED Course/ Medical Decision Making/ A&P                                 Medical Decision Making Risk Prescription drug management.   Patient some burning in left low back and some of the left leg.  Has been on steroids for same recently.  No new injury.  States he has a curve in his  spine from previous accident.  Has had steroids without relief.  Most likely sciatica.  Less likely other causes such as epidural abscess.  Zoster felt less likely with the duration of symptoms and no rash.  Has been on Neurontin  previously for neuropathy.  Currently on twice a day.  Will increase this to 3 times a day.  Will have follow-up with neurosurgery.  Appears stable for discharge home.        Final Clinical Impression(s) / ED Diagnoses Final diagnoses:  Sciatica of left side    Rx / DC Orders ED Discharge Orders          Ordered    gabapentin  (NEURONTIN ) 300 MG capsule  3 times daily        06/25/23 0919              [name], MD 06/25/23 [id number]

## 2023-09-11 ENCOUNTER — Ambulatory Visit: Admitting: Pediatric Genetics

## 2023-09-11 DIAGNOSIS — Q8501 Neurofibromatosis, type 1: Secondary | ICD-10-CM

## 2023-09-11 NOTE — Progress Notes (Unsigned)
 MEDICAL GENETICS NEW PATIENT EVALUATION  Patient name: Luke Wade DOB: September 18, 1980 Age: 43 y.o. MRN: [id number]  Referring Provider/Specialty: Self Date of Evaluation: 09/11/2023 Chief Complaint/Reason for Referral: New diagnosis NF1  HPI: Luke Wade is a 43 y.o. 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  Luke Wade's youngest daughter was seen in our Genetics clinic due to multiple cafe au lait spots. 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  Luke Wade has multiple cafe au laits and reports a lump on his chest that appeared a few years ago- it is not painful and he states he had evaluation for it and it was not malignant (?neurofibroma). He has chronic hypertension that was likely untreated for several years and lead to concerns of hypertrophic cardiomyopathy - currently managed with verapamil  SR and metoprolol . 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 He notes that he has scoliosis- identified after imaging was performed following an accident.  Luke Wade follows with a cardiologist and there was concern for hypertrophic cardiomyopathy. Cardiac MRI showed Normal LV size with moderate LV hypertrophy involving the mid to apical LV segments with signs of a mid-cavity LV gradient, possibly suggestive of mid-apical variant hypertrophic cardiomyopathy vs longstanding hypertensive heart disease. Father reports that the cardiologist feels at this time it is more consistent with the latter rather than HCM. He did undergo limited genetic testing through Danford Pac, PhD with [employer]-  Hypertrophic cardiomyopathy gene panel (MYBPC3, MYH7, TNNT3, TNNT2, TPM1, ACTG1, MYL2, MYL3, LAMP2, GLA, PRKAG2, TTR, PLN). This was negative. Kemoni follows with [facility], Dr. Ladona- last visit 05/2023, f/u recommended in 1 year.  Social History: Married. 2 biological daughters. Wife has two other daughters from prior relationship.  Medications: Current Outpatient Medications on File Prior to Visit  Medication Sig Dispense Refill   ALPRAZolam (XANAX) 0.5 MG tablet Take 0.25 mg by mouth every 4 (four) hours as needed for anxiety. (Patient not taking: Reported on 05/15/2023)     gabapentin  (NEURONTIN ) 300 MG capsule Take 1 capsule (300 mg total) by mouth 3 (three) times daily. 90 capsule 0   metoprolol  succinate (TOPROL -XL) 100 MG 24 hr tablet Take 1 tablet (100 mg total) by mouth daily. 90 tablet 3   nitroGLYCERIN  (NITROSTAT ) 0.4 MG SL tablet Place 1 tablet (0.4 mg total) under the tongue every 5 (five) minutes as needed for chest pain. (Patient not taking: Reported on 05/15/2023) 25 tablet 9   Semaglutide -Weight Management 0.25 MG/0.5ML SOAJ Inject 0.25 mg into the skin once a week. 2 mL 2   spironolactone  (ALDACTONE ) 50 MG tablet Take 1 tablet (50 mg total) by mouth every morning. 90 tablet 3   verapamil  (CALAN -SR) 180 MG CR tablet Take 1 tablet (180 mg total) by mouth at bedtime. 90 tablet 3   No current facility-administered medications on file prior to visit.    Review of Systems: General: Obesity. 5'5. Large head size based on exam and by patient report. Eyes/vision: partially blind left eye, unknown cause. Follows with an eye doctor each year. Ears/hearing: no concerns. Dental: no concerns. Respiratory: no concerns. Cardiovascular: chronic HTN. Hypertensive heart  disease vs hypertrophic cardiomyopathy. Gastrointestinal: no concerns. Genitourinary: no concerns. Endocrine: no concerns. Hematologic: no concerns. Immunologic: no concerns. Neurological: no concerns. Psychiatric: no  concerns. Musculoskeletal: scoliosis. Skin, Hair, Nails: multiple cafe au laits. Lump on chest- ?neurofibroma.  Family History: See pedigree below obtained during daughter's prior visit:    Notable family history: Luke Wade has two biological daughters. 31 yo daughter was seen in genetics for cafe au laits, axillary and inguinal freckling, and now has genetically confirmed neurofibromatosis type 1. She also has a history of delays with recent concern for learning disability. 63 yo daughter has some birth marks, though not as many as 44 yo, and some have reportedly faded. She occasionally has headaches and vision concerns.   Luke Wade's sister has some cafe au laits, otherwise healthy, and has 6 children. His mother had multiple cafe au laits, as did other maternal relatives. She died of cancer- possibly bladder. Her sister died of breast cancer, and brother died of cancer (unknown type). Luke Wade's father died of leukemia at 40 yo. His sister and mother died of breast cancer later in life.  No other family members have had genetic testing in past nor a known dx of NF1.  Ethnicity: Black Consanguinity: not assessed  Physical Examination: There were no vitals taken for this visit.  General: Alert, interactive, shorter stature Skin: Reportedly has multiple CAL macules and a lump on chest subcutaneously Neurologic: Normal tone, normal gait, no abnormal movements Psych: Age-appropriate interactions  Prior Genetic testing: Comparator in daughter's WES ([employer], reported 06/27/2023, accession [id number]) NF1, c.2542 G>A, p.(G848R) Likely Pathogenic  Pertinent Labs: None  Pertinent Imaging/Studies: ECHO 03/2021 (Complete):  1. Mid systolic cavity obliteration noted with high velocity flow (38m/s).  Consider hypertrophic cardiomyopathy with mid cavity obliteration. MRI of  the heart should be consider. Left ventricular ejection fraction, by  estimation, is >75%. The left  ventricle has hyperdynamic function.  The left ventricle has no regional  wall motion abnormalities. There is moderate left ventricular hypertrophy.  Left ventricular diastolic parameters are consistent with Grade I  diastolic dysfunction (impaired  relaxation).   2. Right ventricular systolic function is normal. The right ventricular  size is normal.   3. The pericardial effusion is circumferential. There is no evidence of  cardiac tamponade.   4. The mitral valve is normal in structure. No evidence of mitral valve  regurgitation. No evidence of mitral stenosis.   5. The aortic valve is normal in structure. Aortic valve regurgitation is  not visualized. No aortic stenosis is present.   6. The inferior vena cava is normal in size with greater than 50%  respiratory variability, suggesting right atrial pressure of 3 mmHg.   MR Cardiac 04/2021: 1.  Small circumferential pericardial effusion. 2. Normal LV size with moderate LV hypertrophy involving the mid to apical LV segments with signs of a mid-cavity LV gradient. EF 55%. 3.  Normal RV size and systolic function. 4.  Possible subtle mid-wall LGE in the apical lateral wall. 5. T1 findings within normal limits (extracellular volume percentage normal range).   This study could be consistent with a mid-apical variant hypertrophic cardiomyopathy. There is not prominent LGE present. Cannot rule out long-standing severe HTN as a cause for the LV changes if this has been present.  Assessment: Luke Wade is a 43 y.o. male who was recently found to have a likely pathogenic variant in the NF1 gene, discovered through testing of his daughter. Nandan himself has short stature, scoliosis, macrocephaly, impaired vision of  the left eye, long-standing hypertension and cafe au lait macules. He reports he has a lump on his chest as well. In reviewing cardiac imaging studies in Epic, he also has moderate LV hypertrophy and a small pericardial effusion. It is possible the hypertrophy is due  to longstanding hypertension.  Genetic considerations regarding Neurofibromatosis, type 1 (NF-1) were discussed with the family. NF-1 is a disorder of neural crest tissue that primarily affects pigmentation and nerve support cells.  As is the case for Luke Wade, the presence of the cafe-au-lait spots serves to alert us  to the possible disturbance of neural crest tissue and a variant within the gene associated with NF-1, known as NF1. Common features of NF-1 include multiple caf-au-lait spots, axillary and inguinal freckling, cutaneous neurofibromas, and iris Lisch nodules. There may be macrocephaly, short stature, and early puberty as well. Less common (but potentially more serious) manifestations of the disorder include plexiform neurofibromas, optic nerve and other central nervous system gliomas, malignant peripheral nerve sheath tumors, increased risk of additional cancers/tumors (including breast, leukemia, rhabdomyosarcomas, pheochromocytomas, paragangliomas, gastrointestinal stromal tumors, glomus tumors, etc), scoliosis, leg bowing, seizures, and hypertension. Most individuals with NF-1 have normal intelligence, but developmental delay, learning disabilities, and attention deficit disorders occur in at least half of affected individuals. While most do well, some learning and neurodevelopmental difficulties may be more significant.   We explained that concerning tumor formation, there are different kinds of neurofibromas. The discrete cutaneous and subcutaneous neurofibromas often continue to appear throughout life. The plexiform neurofibromas are more vascular and diffuse than the other kinds of neurofibromas, and they penetrate into the surrounding tissues. Plexiform neurofibromas usually do not appear after childhood or adolescence. However, once present, some plexiform neurofibromas may grow excessively- though not all of them do. On occasion, the neurofibromas may cause symptoms. Management is directed at  clinical problems that arise. Those tumors that may be painful, grow rapidly, or elicit concern should be considered for removal or treatments to help reduce the size. Also, tumors can occur anywhere, and not necessarily where cafe au lait spots are evident. As a result, some patients have symptoms for which a specific tumor cannot be identified.  Management Regarding management, Luke Wade should be evaluated on a clinical basis and treated as indicated. The following evaluations should occur annually in adults with NF-1 (per ACMG guidelines- PMID: 69993413): Physical exam for neurofibromas, changes in plexiform neurofibromas, and other signs/symptoms of malignancy Women: Mammogram beginning at 44 yo, consider breast MRI annually between 61 and 69 yo Neurologic assessment for neurologic deficit, headaches, seizures, sleep disturbance, & pain Consider brain MRI if indicated Assess for cognitive issues & depression. Clinical assessment for scoliosis & osteoporosis Serum vitamin D levels as needed Blood pressure Most common causes are essential HTN, pheochromocytoma, and (in young adults) renal artery stenosis Consider screening for pheochromocytoma if indicated (plasma free metanephrines) Consider screening for renal artery stenosis if indicated (MRA, renal angiography) Assess for/manage any cardiac/vascular concerns  There are no routine imaging studies or interventions that are typically recommended. Any other evaluation and imaging studies would be obtained based on clinical indications, as needed.  Additionally, we recommend that Luke Wade (and any other affected relatives) follow with an NF-1 specialty clinic. The family is interested in being seen by the clinic at [facility]. We will place a referral for Luke Wade to be seen at this clinic. We provided the family with some information about NF-1. We encouraged them to contact the [employer], a support organization for both children  and adults with  NF-1, for additional information.   Inheritance NF-1 is an autosomal dominant condition. This means that a single pathogenic variant in one copy of the NF1 gene is sufficient to cause symptoms. There is a 50% risk for offspring of an affected individual to also be affected, although the spectrum and severity of symptoms varies greatly among affected individuals, even within the same family. Other family members (including Luke Wade's 8 yo daughter and his sister) may also have the variant, and could undergo genetic testing. We will test his other daughter today, and recommended testing in his sister as well.  At this time it is unknown if Luke Wade inherited the variant from a parent or if it was a new change in him. Approximately 50% of individuals with NF-1 inherited a variant from a parent, and the remaining 50% are diagnosed with NF-1 as the result of a new (de novo) variant.  Interestingly - while variants in the NF1 gene can cause NF1, it can also cause Noonan syndrome as well as a combined neurofibromatosis-Noonan phenotype. We wonder if his pericardial effusion could be explained by a Noonan phenotype. We would appreciate the NF1 clinic further evaluate Luke Wade in this regard as well.  Other- Hypertensive heart disease vs Cardiomyopathy Of note, it is likely that Luke Wade's hypertension is in some way related to his diagnosis of NF-1, although other lifestyle factors can contribute as well. If there is difficulty in managing blood pressure, it is possible the NF-1 Clinic at [facility] may have additional ideas or treatment options specific to NF-1. Some additional findings (such as pheochromocytomas, renal artery stenosis) may also contribute to hypertension and screening may be considered for this if appropriate.  If there is concern that his heart disease is truly hypertrophic cardiomyopathy, we discussed that the genetic testing he previously had was limited. There are many other genes associated with cardiomyopathy  that can be tested for. Identification of a gene associated with cardiomyopathy could guide further management and would also be helpful for screening in family members. If interested in further testing in this regard, we, or our adult genetics colleague here at [facility], Dr. [place] Wade, would be happy to facilitate this.  Recommendations: Referral to [facility] NF1 speciality clinic for ongoing management Will also inform PCP and Cardiologist of his genetic dx   [employer], MS, [facility] Certified Genetic Counselor  Rumalda Lighter, D.O. Attending Physician, [facility] Date: 09/12/2023 Time: 4:41pm   Total time spent: 30 minutes Time spent includes face to face and non-face to face care for the patient on the date of this encounter (history and physical, genetic counseling, coordination of care, data gathering and/or documentation as outlined)

## 2023-09-12 DIAGNOSIS — Q8501 Neurofibromatosis, type 1: Secondary | ICD-10-CM | POA: Insufficient documentation

## 2023-09-12 NOTE — Patient Instructions (Signed)
 At [facility], we are committed to providing exceptional care. You will receive a patient satisfaction survey through text or email regarding your visit today. Your opinion is important to me. Comments are appreciated.  We have placed a referral for Luke Wade to see the [facility] NF1 specialty clinic in [place]. They will contact you to schedule an appointment.

## 2023-09-13 NOTE — Addendum Note (Signed)
 Addended byBETHA GEORGIANNA EE on: 09/13/2023 03:43 PM   Modules accepted: Orders

## 2023-10-09 ENCOUNTER — Other Ambulatory Visit: Payer: Self-pay | Admitting: Cardiology

## 2023-10-09 DIAGNOSIS — I119 Hypertensive heart disease without heart failure: Secondary | ICD-10-CM

## 2023-10-10 ENCOUNTER — Other Ambulatory Visit: Payer: Self-pay | Admitting: Cardiology

## 2023-10-10 DIAGNOSIS — I119 Hypertensive heart disease without heart failure: Secondary | ICD-10-CM

## 2023-10-10 MED ORDER — VERAPAMIL HCL ER 180 MG PO TBCR
180.0000 mg | EXTENDED_RELEASE_TABLET | Freq: Every day | ORAL | 2 refills | Status: AC
Start: 2023-10-10 — End: ?

## 2023-10-30 ENCOUNTER — Other Ambulatory Visit: Payer: Self-pay | Admitting: Cardiology

## 2023-10-30 DIAGNOSIS — I10 Essential (primary) hypertension: Secondary | ICD-10-CM

## 2024-02-13 ENCOUNTER — Emergency Department (HOSPITAL_COMMUNITY)

## 2024-02-13 ENCOUNTER — Other Ambulatory Visit: Payer: Self-pay

## 2024-02-13 ENCOUNTER — Emergency Department (HOSPITAL_COMMUNITY)
Admission: EM | Admit: 2024-02-13 | Discharge: 2024-02-13 | Disposition: A | Discharge status: Home / Self Care | Attending: Emergency Medicine | Admitting: Emergency Medicine

## 2024-02-13 ENCOUNTER — Encounter (HOSPITAL_COMMUNITY): Payer: Self-pay | Admitting: Pharmacy Technician

## 2024-02-13 DIAGNOSIS — B349 Viral infection, unspecified: Secondary | ICD-10-CM

## 2024-02-13 DIAGNOSIS — I10 Essential (primary) hypertension: Secondary | ICD-10-CM | POA: Insufficient documentation

## 2024-02-13 DIAGNOSIS — R051 Acute cough: Secondary | ICD-10-CM | POA: Diagnosis not present

## 2024-02-13 DIAGNOSIS — Z79899 Other long term (current) drug therapy: Secondary | ICD-10-CM | POA: Diagnosis not present

## 2024-02-13 DIAGNOSIS — R059 Cough, unspecified: Secondary | ICD-10-CM | POA: Diagnosis present

## 2024-02-13 LAB — CBC
HCT: 46.4 % (ref 39.0–52.0)
Hemoglobin: 14.5 g/dL (ref 13.0–17.0)
MCH: 25.4 pg — ABNORMAL LOW (ref 26.0–34.0)
MCHC: 31.3 g/dL (ref 30.0–36.0)
MCV: 81.3 fL (ref 80.0–100.0)
Platelets: 260 K/uL (ref 150–400)
RBC: 5.71 MIL/uL (ref 4.22–5.81)
RDW: 14.8 % (ref 11.5–15.5)
WBC: 9.1 K/uL (ref 4.0–10.5)
nRBC: 0 % (ref 0.0–0.2)

## 2024-02-13 LAB — BASIC METABOLIC PANEL WITH GFR
Anion gap: 12 (ref 5–15)
BUN: 16 mg/dL (ref 6–20)
CO2: 23 mmol/L (ref 22–32)
Calcium: 9.3 mg/dL (ref 8.9–10.3)
Chloride: 103 mmol/L (ref 98–111)
Creatinine, Ser: 0.98 mg/dL (ref 0.61–1.24)
GFR, Estimated: >60 mL/min
Glucose, Bld: 100 mg/dL — ABNORMAL HIGH (ref 70–99)
Potassium: 4.4 mmol/L (ref 3.5–5.1)
Sodium: 137 mmol/L (ref 135–145)

## 2024-02-13 MED ORDER — PREDNISONE 20 MG PO TABS
20.0000 mg | ORAL_TABLET | Freq: Once | ORAL | Status: AC
  Administered 2024-02-13: 20 mg via ORAL
  Filled 2024-02-13: qty 1

## 2024-02-13 MED ORDER — PREDNISONE 10 MG PO TABS: 20.0000 mg | ORAL_TABLET | Freq: Every day | ORAL | 0 refills | Status: AC

## 2024-02-13 NOTE — ED Triage Notes (Signed)
 Pt here with complaints of cough, congestion, headache for the last week. States daughter was sick recently with same symptoms. Over the last few days has developed some shob.

## 2024-02-13 NOTE — ED Provider Notes (Signed)
 " [place] EMERGENCY DEPARTMENT AT [facility] Provider Note   CSN: [id number] Arrival date & time: 02/13/24  9288     History No chief complaint on file.   HPI: [place] Wade is a 44 y.o. male with history pertinent for hypertension, anxiety, elevated BMI, neurofibromatosis who presents complaining of multiple symptoms. Patient arrived via POV.  History provided by patient.  No interpreter required during this encounter.  Patient presents emergency department with multiple symptoms.  Reports that his daughter was recently sick with the flu, and shortly thereafter he has developed similar symptoms of congestion, rhinorrhea, sneezing, malaise, fatigue, mild headache similar to prior.  Reports that he has for the most part improving from the symptoms, however reports that he has persistent cough and shortness of breath.  Reports that he had attempted to stop smoking, and this occurred before he developed his viral symptoms, however he had a cigarette for the first time yesterday and he felt short of breath after smoking a cigarette.  Reports that he has also had difficulty tolerating his CPAP over the past 2 nights, thus his wife encouraged him to come to the emergency department this morning.  Does not actively have shortness of breath.  Patient with no known history of asthma or COPD.  Patient's recorded medical, surgical, social, medication list and allergies were reviewed in the Snapshot window as part of the initial history.   Prior to Admission medications  Medication Sig Start Date End Date Taking? Authorizing Provider  ALPRAZolam (XANAX) 0.5 MG tablet Take 0.25 mg by mouth every 4 (four) hours as needed for anxiety. Patient not taking: Reported on 05/15/2023 01/03/22   [provider]  gabapentin  (NEURONTIN ) 300 MG capsule Take 1 capsule (300 mg total) by mouth 3 (three) times daily. 06/25/23   Patsey Lot, MD  metoprolol  succinate (TOPROL -XL) 100 MG 24 hr  tablet TAKE 1 TABLET BY MOUTH EVERY DAY 10/10/23   Ladona Heinz, MD  nitroGLYCERIN  (NITROSTAT ) 0.4 MG SL tablet Place 1 tablet (0.4 mg total) under the tongue every 5 (five) minutes as needed for chest pain. Patient not taking: Reported on 05/15/2023 04/18/22   Revankar, Jennifer SAUNDERS, MD  Semaglutide -Weight Management 0.25 MG/0.5ML SOAJ Inject 0.25 mg into the skin once a week. 05/15/23   Ladona Heinz, MD  spironolactone  (ALDACTONE ) 50 MG tablet TAKE 1 TABLET BY MOUTH EVERY MORNING 10/30/23   Ladona Heinz, MD  verapamil  (CALAN -SR) 180 MG CR tablet Take 1 tablet (180 mg total) by mouth at bedtime. 10/10/23   Ladona Heinz, MD     Allergies: Contrast media [iodinated contrast media]   Review of Systems   ROS as per HPI  Physical Exam Updated Vital Signs BP (!) 149/81 (BP Location: Left Arm)   Pulse 70   Temp 98.2 F (36.8 C) (Oral)   Resp 18   SpO2 98%  Physical Exam Vitals and nursing note reviewed.  Constitutional:      General: He is not in acute distress.    Appearance: He is well-developed.  HENT:     Head: Normocephalic and atraumatic.     Nose: Congestion present.  Eyes:     Conjunctiva/sclera: Conjunctivae normal.  Cardiovascular:     Rate and Rhythm: Normal rate and regular rhythm.     Heart sounds: No murmur heard. Pulmonary:     Effort: Pulmonary effort is normal. No respiratory distress.     Breath sounds: Normal breath sounds. No wheezing.  Abdominal:  Palpations: Abdomen is soft.     Tenderness: There is no abdominal tenderness.  Musculoskeletal:        General: No swelling.     Cervical back: Neck supple.  Skin:    General: Skin is warm and dry.     Capillary Refill: Capillary refill takes less than 2 seconds.  Neurological:     Mental Status: He is alert.  Psychiatric:        Mood and Affect: Mood normal.     ED Course/ Medical Decision Making/ A&P    Procedures Procedures   Medications Ordered in ED Medications  predniSONE  (DELTASONE ) tablet 20 mg (has no  administration in time range)    Medical Decision Making:   [place] Wade is a 44 y.o. male who presents for multiple sick symptoms as per above.  Physical exam is pertinent for no focal abnormalities.   The differential includes but is not limited to viral illness, postviral bronchitis, pneumonia, congestion, sepsis.  Independent historian: None  External data reviewed: Labs: reviewed prior labs for baseline  Initial Plan:  Screening labs including CBC and Metabolic panel to evaluate for infectious or metabolic etiology of disease.  Chest x-ray to evaluate for structural/infectious intra-thoracic pathology.  Objective evaluation as below reviewed   Labs: Ordered, Independent interpretation, and Details: CBC without leukocytosis, anemia, thrombocytopenia.  BMP without AKI or emergent electrolyte derangement  Radiology: Ordered, Independent interpretation, Details: X-ray without focal airspace opacification, cardiomediastinal switcher derangement, pneumothorax, pleural effusion, bony derangement., and All images reviewed independently.  Agree with radiology report at this time.   DG Chest 2 View Result Date: 02/13/2024 EXAM: 2 VIEW(S) XRAY OF THE CHEST 02/13/2024 07:47:00 AM COMPARISON: PA and lateral 03/04/2022. CLINICAL HISTORY: cough , congestion and headache times 1 week. Sick contact at home. FINDINGS: LUNGS AND PLEURA: No focal pulmonary opacity. No pleural effusion. No pneumothorax. HEART AND MEDIASTINUM: No acute abnormality of the cardiac and mediastinal silhouettes. BONES AND SOFT TISSUES: No acute osseous abnormality. IMPRESSION: 1. No acute process. Electronically signed by: Francis Quam MD 02/13/2024 07:50 AM EST RP Workstation: [id number]    EKG/Medicine tests: Not indicated EKG Interpretation:                  Interventions: Prednisone   See the EMR for full details regarding lab and imaging results.  Patient presents to the emergency department for persistent cough  after recent viral symptoms consistent with viral illness, also complains of shortness of breath specifically when smoking cigarettes and while wearing CPAP.  Patient does have significant congestion on exam, however otherwise well-appearing, do feel that congestion is likely contributing to patient's CPAP intolerance, otherwise he has normal work of breathing, normal oxygenation, no wheezing, no increased work of breathing.  Given patient has persistent cough despite improvement in the remainder of his symptoms, feel that patient may have postviral bronchitis, will treat with burst of steroids.  For congestion, recommended hydration, OTC Mucinex, follow-up with PCP.  Patient is comfortable with this plan, discharged in stable condition.  Presentation is most consistent with acute complicated illness, Current presentation is complicated by underlying chronic conditions, and I did consider and rule out acute life/limb-threatening illness  Discussion of management or test interpretations with external provider(s): Not indicated  Risk Drugs:Prescription drug management  Disposition: DISCHARGE: I believe that the patient is safe for discharge home with outpatient follow-up. Patient was informed of all pertinent physical exam, laboratory, and imaging findings. Patient's suspected etiology of their symptom presentation was discussed  with the patient and all questions were answered. We discussed following up with PCP. I provided thorough ED return precautions. The patient feels safe and comfortable with this plan.  MDM generated using voice dictation software and may contain dictation errors.  Please contact me for any clarification or with any questions.  Clinical Impression:  1. Acute cough      Discharge   Final Clinical Impression(s) / ED Diagnoses Final diagnoses:  Acute cough    Rx / DC Orders ED Discharge Orders     None        Rogelia Jerilynn RAMAN, MD 02/13/24 [phone]  "

## 2024-02-13 NOTE — Discharge Instructions (Addendum)
 [place]  Thank you for allowing us  to take care of you today.  You came to the Emergency Department today because you and your daughter recently sick with multiple viral symptoms, and since then you have had persistent cough, as well as some shortness of breath particularly while smoking and while wearing your CPAP.  You do have significant congestion on your exam, which is likely contributing to your poor tolerance of your CPAP.  We recommend over-the-counter Mucinex as well as plenty of fluids over the next several days, which will help clear up your congestion and hopefully help you tolerate it better.  We recommend against cigarette use, in general, particularly while recovering from a viral illness as it can further irritate your already inflamed lungs.  You likely have some residual inflammation in your lungs from your viral illness, however your lungs are sounding well on exam without any wheezing, and you do not have any signs of pneumonia on your chest x-ray.  We will give you 4 days of steroids that you will take by mouth, that can help decrease the inflammation in your lungs.   To-Do: 1. Please follow-up with your primary doctor within 1 - 2 weeks / as soon as possible.   Please return to the Emergency Department or call 911 if you experience have worsening of your symptoms, or do not get better, chest pain, new/different/worsening shortness of breath, severe or significantly worsening pain, high fever, severe confusion, pass out or have any reason to think that you need emergency medical care.   We hope you feel better soon.   Mitzie Later, MD Department of Emergency Medicine [facility] [place]

## 2024-02-26 ENCOUNTER — Emergency Department (HOSPITAL_COMMUNITY)
Admission: EM | Admit: 2024-02-26 | Discharge: 2024-02-26 | Disposition: A | Discharge status: Home / Self Care | Attending: Emergency Medicine | Admitting: Emergency Medicine

## 2024-02-26 ENCOUNTER — Other Ambulatory Visit: Payer: Self-pay

## 2024-02-26 DIAGNOSIS — I10 Essential (primary) hypertension: Secondary | ICD-10-CM | POA: Diagnosis not present

## 2024-02-26 DIAGNOSIS — Z79899 Other long term (current) drug therapy: Secondary | ICD-10-CM | POA: Insufficient documentation

## 2024-02-26 DIAGNOSIS — M25551 Pain in right hip: Secondary | ICD-10-CM | POA: Diagnosis present

## 2024-02-26 MED ORDER — CYCLOBENZAPRINE HCL 10 MG PO TABS: 10.0000 mg | ORAL_TABLET | Freq: Two times a day (BID) | ORAL | 0 refills | Status: AC | PRN

## 2024-02-26 MED ORDER — IBUPROFEN 800 MG PO TABS
800.0000 mg | ORAL_TABLET | Freq: Once | ORAL | Status: AC
  Administered 2024-02-26: 800 mg via ORAL
  Filled 2024-02-26: qty 1

## 2024-02-26 MED ORDER — LIDOCAINE 5 % EX PTCH
1.0000 | MEDICATED_PATCH | CUTANEOUS | Status: DC
  Administered 2024-02-26: 1 via TRANSDERMAL
  Filled 2024-02-26: qty 1

## 2024-02-26 MED ORDER — LIDOCAINE 5 % EX PTCH: 1.0000 | MEDICATED_PATCH | CUTANEOUS | 0 refills | Status: AC

## 2024-02-26 NOTE — ED Provider Notes (Signed)
 " [place] EMERGENCY DEPARTMENT AT [facility] Provider Note   CSN: [id number] Arrival date & time: 02/26/24  9077     Patient presents with: Hip Pain   [place] Wade is a 44 y.o. male.   44 year old male presenting with right hip pain.  Patient notes that he was moving boxes in his garage yesterday when he twisted his torso while holding a box and felt a pop in the region of his right hip.  Since that time he has felt discomfort in the right hip, particularly while sitting or rotating his upper body.  He is able to ambulate/bear weight fully without assistance, pain is not worse on ambulation, he denies any fall/direct impact to the right hip.  Denies lower extremity weakness/numbness/tingling, no bowel/bladder incontinence, no urinary symptoms.  He has not tried any medications for relief of his pain.   Hip Pain       Prior to Admission medications  Medication Sig Start Date End Date Taking? Authorizing Provider  ALPRAZolam (XANAX) 0.5 MG tablet Take 0.25 mg by mouth every 4 (four) hours as needed for anxiety. Patient not taking: Reported on 05/15/2023 01/03/22   [provider]  gabapentin  (NEURONTIN ) 300 MG capsule Take 1 capsule (300 mg total) by mouth 3 (three) times daily. 06/25/23   Patsey Lot, MD  metoprolol  succinate (TOPROL -XL) 100 MG 24 hr tablet TAKE 1 TABLET BY MOUTH EVERY DAY 10/10/23   Ladona Heinz, MD  nitroGLYCERIN  (NITROSTAT ) 0.4 MG SL tablet Place 1 tablet (0.4 mg total) under the tongue every 5 (five) minutes as needed for chest pain. Patient not taking: Reported on 05/15/2023 04/18/22   Revankar, Rajan R, MD  Semaglutide -Weight Management 0.25 MG/0.5ML SOAJ Inject 0.25 mg into the skin once a week. 05/15/23   Ladona Heinz, MD  spironolactone  (ALDACTONE ) 50 MG tablet TAKE 1 TABLET BY MOUTH EVERY MORNING 10/30/23   Ladona Heinz, MD  verapamil  (CALAN -SR) 180 MG CR tablet Take 1 tablet (180 mg total) by mouth at bedtime. 10/10/23   Ladona Heinz, MD     Allergies: Contrast media [iodinated contrast media]    Review of Systems  Updated Vital Signs BP (!) 154/83 (BP Location: Left Arm)   Pulse 96   Temp (!) 97.5 F (36.4 C) (Oral)   Resp 16   SpO2 98%   Physical Exam Vitals and nursing note reviewed.  Constitutional:      Appearance: Normal appearance.  HENT:     Head: Normocephalic and atraumatic.  Eyes:     Extraocular Movements: Extraocular movements intact.     Pupils: Pupils are equal, round, and reactive to light.  Cardiovascular:     Rate and Rhythm: Normal rate.  Pulmonary:     Effort: Pulmonary effort is normal.  Musculoskeletal:       Arms:     Cervical back: Normal range of motion.     Comments: R hip: No bony tenderness or abnormality on exam, ambulates without difficulty and bears weight fully on the affected extremity. Tenderness in the region of the obliques with reproducible pain noted with lateral rotation of the trunk.  5/5 strength against resistance of bilateral lower extremities  Back: No midline spinous process tenderness, no bony deformity/step-off  Skin:    General: Skin is warm and dry.  Neurological:     General: No focal deficit present.     Mental Status: He is alert and oriented to person, place, and time.     (all labs ordered  are listed, but only abnormal results are displayed) Labs Reviewed - No data to display  EKG: None  Radiology: No results found.   Procedures   Medications Ordered in the ED  ibuprofen  (ADVIL ) tablet 800 mg (has no administration in time range)  lidocaine  (LIDODERM ) 5 % 1 patch (has no administration in time range)                                    Medical Decision Making This patient presents to the ED for concern of hip pain, this involves an extensive number of treatment options, and is a complaint that carries with it a high risk of complications and morbidity.  The differential diagnosis includes muscle strain/sprain, ligamentous versus  tendinous injury, fracture, dislocation   Co morbidities that complicate the patient evaluation  Hypertension   Cardiac Monitoring: / EKG:  The patient was maintained on a cardiac monitor.  I personally viewed and interpreted the cardiac monitored which showed an underlying rhythm of: NSR   Problem List / ED Course / Critical interventions / Medication management  I ordered medication including ibuprofen  and lidocaine  patch for pain I have reviewed the patients home medicines and have made adjustments as needed   Social Determinants of Health:  Former tobacco use   Test / Admission - Considered:  Physical exam is notable as above, patient does have some tenderness on exam in the region of the obliques on the right side, there is no bony deformity of the hip or tenderness to the bony hip/pelvis, patient was observed ambulating and is bearing full weight without limp or gait alterations.  Normal examination of the back, no midline spinous process tenderness, no red flag back pain signs/symptoms.  Patient did not experience a fall or any kind of direct impact to the hip, at this point I do not feel that x-ray imaging is necessary and this was discussed with the patient who is in agreement. I suspect his symptoms today are likely due to a muscle sprain/strain, he has not tried any medications for relief of his discomfort.  I recommend that he use Tylenol /ibuprofen  as needed for pain, will prescribe lidocaine  patches as well.  Will prescribe Flexeril  to be used as needed for muscle spasms, patient is aware this medication may cause fatigue/drowsiness. Return precautions discussed, patient voiced understanding and is in agreement this plan, he is appropriate for discharge at this time.  Risk Prescription drug management.        Final diagnoses:  Right hip pain    ED Discharge Orders          Ordered    lidocaine  (LIDODERM ) 5 %  Every 24 hours        02/26/24 0942     cyclobenzaprine  (FLEXERIL ) 10 MG tablet  2 times daily PRN        02/26/24 0942               Glendia Rocky SAILOR, PA-C 02/26/24 9045    Darra Fonda MATSU, MD 02/26/24 1234  "

## 2024-02-26 NOTE — ED Triage Notes (Signed)
 PT ambulatory to triage with complaints of RIGHT hip pain that began after working in his garage yesterday. Pt denies taking any medications in an attempt to relieve pain. PT appears to be in no distress at the time of triage.

## 2024-02-26 NOTE — Discharge Instructions (Addendum)
 Your symptoms today are most consistent with a muscle strain/sprain.  Continue ibuprofen /Tylenol  as needed for pain, you may also use lidocaine  patches.  Use Flexeril , 1 tablet by mouth up to twice daily as needed for muscle spasms, please be aware that this medication may cause fatigue/drowsiness.  Return to the emergency department if your symptoms worsen.  Follow-up with your PCP as needed.

## 2024-05-22 ENCOUNTER — Ambulatory Visit: Admitting: Cardiology
